# Patient Record
Sex: Female | Born: 1935 | Race: White | Hispanic: No | Marital: Married | Smoking: Never smoker
Health system: Southern US, Community
[De-identification: ages and names within clinical notes are randomized; demographics above are authoritative.]

## PROBLEM LIST (undated history)

## (undated) DIAGNOSIS — I272 Pulmonary hypertension, unspecified: Secondary | ICD-10-CM

## (undated) DIAGNOSIS — M1990 Unspecified osteoarthritis, unspecified site: Secondary | ICD-10-CM

## (undated) DIAGNOSIS — I4891 Unspecified atrial fibrillation: Secondary | ICD-10-CM

## (undated) DIAGNOSIS — Z9289 Personal history of other medical treatment: Secondary | ICD-10-CM

## (undated) DIAGNOSIS — Z8701 Personal history of pneumonia (recurrent): Secondary | ICD-10-CM

## (undated) DIAGNOSIS — Z8719 Personal history of other diseases of the digestive system: Secondary | ICD-10-CM

## (undated) DIAGNOSIS — I509 Heart failure, unspecified: Secondary | ICD-10-CM

## (undated) DIAGNOSIS — I10 Essential (primary) hypertension: Secondary | ICD-10-CM

## (undated) DIAGNOSIS — R0600 Dyspnea, unspecified: Secondary | ICD-10-CM

## (undated) DIAGNOSIS — Z9889 Other specified postprocedural states: Secondary | ICD-10-CM

## (undated) DIAGNOSIS — E871 Hypo-osmolality and hyponatremia: Secondary | ICD-10-CM

## (undated) DIAGNOSIS — I2510 Atherosclerotic heart disease of native coronary artery without angina pectoris: Secondary | ICD-10-CM

## (undated) HISTORY — PX: COLONOSCOPY: SHX174

## (undated) HISTORY — DX: Personal history of other diseases of the digestive system: Z87.19

## (undated) HISTORY — PX: TOTAL HIP ARTHROPLASTY: SHX124

## (undated) HISTORY — DX: Hypo-osmolality and hyponatremia: E87.1

## (undated) HISTORY — PX: TOTAL KNEE ARTHROPLASTY: SHX125

## (undated) HISTORY — DX: Unspecified osteoarthritis, unspecified site: M19.90

## (undated) HISTORY — PX: VIDEO ASSISTED THORACOSCOPY (VATS)/THOROCOTOMY: SHX6173

## (undated) HISTORY — DX: Other specified postprocedural states: Z98.890

## (undated) HISTORY — PX: VESICOVAGINAL FISTULA CLOSURE W/ TAH: SUR271

## (undated) HISTORY — PX: RECTOCELE REPAIR: SHX761

## (undated) HISTORY — DX: Personal history of other medical treatment: Z92.89

---

## 2010-11-18 DIAGNOSIS — H348192 Central retinal vein occlusion, unspecified eye, stable: Secondary | ICD-10-CM | POA: Insufficient documentation

## 2010-11-18 DIAGNOSIS — H3581 Retinal edema: Secondary | ICD-10-CM | POA: Insufficient documentation

## 2010-12-11 DIAGNOSIS — IMO0001 Reserved for inherently not codable concepts without codable children: Secondary | ICD-10-CM | POA: Insufficient documentation

## 2010-12-11 DIAGNOSIS — I10 Essential (primary) hypertension: Secondary | ICD-10-CM | POA: Insufficient documentation

## 2011-02-03 DIAGNOSIS — Z961 Presence of intraocular lens: Secondary | ICD-10-CM | POA: Insufficient documentation

## 2011-06-09 DIAGNOSIS — H35379 Puckering of macula, unspecified eye: Secondary | ICD-10-CM | POA: Insufficient documentation

## 2012-02-07 ENCOUNTER — Ambulatory Visit (INDEPENDENT_AMBULATORY_CARE_PROVIDER_SITE_OTHER): Payer: Medicare Other | Admitting: Cardiovascular Disease

## 2012-02-07 ENCOUNTER — Encounter: Payer: Self-pay | Admitting: Cardiovascular Disease

## 2012-02-07 VITALS — BP 162/80 | HR 72 | Ht 67.0 in | Wt 137.0 lb

## 2012-02-07 DIAGNOSIS — I272 Pulmonary hypertension, unspecified: Secondary | ICD-10-CM

## 2012-02-07 DIAGNOSIS — I351 Nonrheumatic aortic (valve) insufficiency: Secondary | ICD-10-CM | POA: Insufficient documentation

## 2012-02-07 DIAGNOSIS — I2789 Other specified pulmonary heart diseases: Secondary | ICD-10-CM

## 2012-02-07 DIAGNOSIS — I359 Nonrheumatic aortic valve disorder, unspecified: Secondary | ICD-10-CM

## 2012-02-07 DIAGNOSIS — R55 Syncope and collapse: Secondary | ICD-10-CM

## 2012-02-07 NOTE — Patient Instructions (Addendum)
Your physician wants you to follow-up in: 6 MONTHS with Dr Cooper.  You will receive a reminder letter in the mail two months in advance. If you don't receive a letter, please call our office to schedule the follow-up appointment.  Your physician has requested that you have an echocardiogram in 6 MONTHS. Echocardiography is a painless test that uses sound waves to create images of your heart. It provides your doctor with information about the size and shape of your heart and how well your heart's chambers and valves are working. This procedure takes approximately one hour. There are no restrictions for this procedure.  Your physician recommends that you continue on your current medications as directed. Please refer to the Current Medication list given to you today.   

## 2012-02-07 NOTE — Progress Notes (Signed)
HPI:   79 year old woman presenting for initial cardiac evaluation. She was hospitalized 01/17/2012 with syncope. This occurred within several days of a knee replacement surgery. She was noted to have acute blood loss anemia and received blood transfusion during her hospitalization. As part of her syncope evaluation, she underwent a 2-D echocardiogram. This demonstrated normal LV systolic function, mild to moderate aortic insufficiency, mild tricuspid insufficiency, moderate pulmonary hypertension with an RV systolic pressure estimated at 54 mm mercury and no other significant findings.  The patient feels much better since this hospitalization. She received 2 units of packed red blood cells. She's had no further lightheadedness or syncopal episodes. She has no history of cardiac disease and she specifically denies any past problems. She denies chest pain, chest pressure, palpitations, edema, orthopnea, or PND. She's been very active and has had no symptoms with exertion. She's never had syncope until this recent episode.  Outpatient Encounter Prescriptions as of 02/07/2012  Medication Sig Dispense Refill  . amLODipine (NORVASC) 5 MG tablet Take 5 mg by mouth daily. Take 1/2 am and 1/2 in the pm prn      . aspirin 81 MG tablet Take 81 mg by mouth daily.      . calcium-vitamin D (OSCAL) 250-125 MG-UNIT per tablet Take 1 tablet by mouth daily.      . famotidine (PEPCID) 20 MG tablet Take 20 mg by mouth 2 (two) times daily.      . ferrous sulfate 325 (65 FE) MG tablet Take 325 mg by mouth 2 (two) times daily.      Marland Kitchen losartan (COZAAR) 100 MG tablet Take 100 mg by mouth daily.      . potassium chloride SA (K-DUR,KLOR-CON) 20 MEQ tablet Take 20 mEq by mouth 2 (two) times daily.      . traMADol (ULTRAM) 50 MG tablet Take 50 mg by mouth every 6 (six) hours as needed.      . triamterene-hydrochlorothiazide (DYAZIDE) 37.5-25 MG per capsule Take 1 capsule by mouth. Take 1/2 daily        Ace inhibitors;  Acetaminophen; Atenolol; Diltiazem hcl; Hctz; Norvasc; and Oxycodone  Past Medical History  Diagnosis Date  . Osteoarthritis   . Hyponatremia   . History of small bowel obstruction     Managed conservatively    Past Surgical History  Procedure Date  . Vesicovaginal fistula closure w/ tah   . Rectocele repair   . Total knee arthroplasty   . Total hip arthroplasty     History   Social History  . Marital Status: Married    Spouse Name: N/A    Number of Children: N/A  . Years of Education: N/A   Occupational History  . Not on file.   Social History Main Topics  . Smoking status: Never Smoker   . Smokeless tobacco: Not on file  . Alcohol Use: Not on file  . Drug Use: Not on file  . Sexually Active: Not on file   Other Topics Concern  . Not on file   Social History Narrative   The patient is married. She is retired from work. She has no history of tobacco or alcohol use.   Family history: Positive for arthritis, negative for premature coronary artery disease  ROS:  General: no fevers/chills/night sweats Eyes: no blurry vision, diplopia, or amaurosis ENT: no sore throat or hearing loss Resp: no cough, wheezing, or hemoptysis CV: no edema or palpitations GI: no abdominal pain, nausea, vomiting, diarrhea, or constipation GU:  no dysuria, frequency, or hematuria Skin: no rash Neuro: no headache, numbness, tingling, or weakness of extremities Musculoskeletal: Positive for knee and hip pain Heme: no bleeding, DVT, or easy bruising Endo: no polydipsia or polyuria  BP 162/80  Pulse 72  Ht 5\' 7"  (1.702 m)  Wt 62.143 kg (137 lb)  BMI 21.46 kg/m2  PHYSICAL EXAM: Pt is alert and oriented, WD, WN, in no distress. HEENT: normal Neck: JVP normal. Carotid upstrokes normal without bruits. No thyromegaly. Lungs: equal expansion, clear bilaterally CV: Apex is discrete and nondisplaced, RRR with a soft systolic murmur at the left lower sternal border. There are no diastolic  murmurs or gallops present. Abd: soft, NT, +BS, no bruit, no hepatosplenomegaly Back: no CVA tenderness Ext: There is some swelling around the left knee. Otherwise no edema.        Femoral pulses 2+= without bruits        DP/PT pulses intact and = Skin: warm and dry without rash Neuro: CNII-XII intact             Strength intact = bilaterally  EKG:  Normal sinus rhythm 72 beats per minute, left anterior fascicular block, moderate voltage for LVH maybe normal variant.  Echocardiogram: Normal left ventricular size and systolic function without segmental abnormality. Mild mitral regurgitation. Mild to moderate aortic regurgitation. Mild tricuspid regurgitation. Moderate pulmonary hypertension with estimated RVSP 54 mm mercury.  ASSESSMENT AND PLAN: 1. Syncope. Seems pretty clear that this was related to postoperative blood loss. She responded well to conservative treatment with fluids and blood transfusion. Symptoms have completely resolved. She never had syncope in the past before this episode.  2. Mild valvular disease. The patient's echocardiogram demonstrated mild to moderate aortic insufficiency. I do not appreciate a murmur of aortic insufficiency on exam. Continue clinical followup.  3. Pulmonary hypertension. The patient does have elevated pulmonary pressures by echocardiogram estimate. No clear cause of this. Most common causes tend to be left heart disease, sleep apnea, pulmonary thromboembolic disease, connective tissue disease, and lung disease. The patient is healthy and I don't think she needs an extensive workup at this point, especially considering the fact that she has no cardiac symptoms. I have recommended a repeat echocardiogram in 6 months and I will see her back after that study is completed.  I appreciate the opportunity to see this nice lady. I'll see her back in 6 months after her follow-up echo.  Tonny Bollman 02/07/2012 5:31 PM

## 2012-02-22 ENCOUNTER — Encounter: Payer: Self-pay | Admitting: Cardiovascular Disease

## 2012-02-22 NOTE — Telephone Encounter (Signed)
Follow-up:    Patient called in to follow-up on his initial call.  Please call back.

## 2012-02-22 NOTE — Telephone Encounter (Signed)
This encounter was created in error - please disregard.

## 2012-02-22 NOTE — Telephone Encounter (Signed)
New problem   Will discuss with the nurse Lauren when she call.

## 2012-03-22 DIAGNOSIS — F419 Anxiety disorder, unspecified: Secondary | ICD-10-CM | POA: Insufficient documentation

## 2012-03-22 DIAGNOSIS — G4700 Insomnia, unspecified: Secondary | ICD-10-CM | POA: Insufficient documentation

## 2012-08-07 ENCOUNTER — Other Ambulatory Visit (HOSPITAL_COMMUNITY): Payer: Medicare Other

## 2012-08-08 ENCOUNTER — Other Ambulatory Visit: Payer: Self-pay

## 2012-08-08 DIAGNOSIS — I359 Nonrheumatic aortic valve disorder, unspecified: Secondary | ICD-10-CM

## 2012-08-08 DIAGNOSIS — I10 Essential (primary) hypertension: Secondary | ICD-10-CM

## 2012-08-08 DIAGNOSIS — I272 Pulmonary hypertension, unspecified: Secondary | ICD-10-CM

## 2012-08-09 ENCOUNTER — Ambulatory Visit (HOSPITAL_COMMUNITY): Payer: Medicare Other | Attending: Cardiology | Admitting: Radiology

## 2012-08-09 ENCOUNTER — Encounter: Payer: Self-pay | Admitting: Cardiovascular Disease

## 2012-08-09 ENCOUNTER — Other Ambulatory Visit (INDEPENDENT_AMBULATORY_CARE_PROVIDER_SITE_OTHER): Payer: Medicare Other

## 2012-08-09 ENCOUNTER — Ambulatory Visit (INDEPENDENT_AMBULATORY_CARE_PROVIDER_SITE_OTHER): Payer: Medicare Other | Admitting: Cardiovascular Disease

## 2012-08-09 VITALS — BP 132/68 | Ht 67.0 in | Wt 145.0 lb

## 2012-08-09 DIAGNOSIS — I272 Pulmonary hypertension, unspecified: Secondary | ICD-10-CM

## 2012-08-09 DIAGNOSIS — I359 Nonrheumatic aortic valve disorder, unspecified: Secondary | ICD-10-CM

## 2012-08-09 DIAGNOSIS — I080 Rheumatic disorders of both mitral and aortic valves: Secondary | ICD-10-CM | POA: Insufficient documentation

## 2012-08-09 DIAGNOSIS — I10 Essential (primary) hypertension: Secondary | ICD-10-CM

## 2012-08-09 DIAGNOSIS — R55 Syncope and collapse: Secondary | ICD-10-CM | POA: Insufficient documentation

## 2012-08-09 DIAGNOSIS — I351 Nonrheumatic aortic (valve) insufficiency: Secondary | ICD-10-CM

## 2012-08-09 DIAGNOSIS — I2789 Other specified pulmonary heart diseases: Secondary | ICD-10-CM

## 2012-08-09 LAB — BASIC METABOLIC PANEL
BUN: 22 mg/dL (ref 6–23)
Chloride: 101 mEq/L (ref 96–112)
Glucose, Bld: 80 mg/dL (ref 70–99)
Potassium: 4.1 mEq/L (ref 3.5–5.1)

## 2012-08-09 LAB — CBC WITH DIFFERENTIAL/PLATELET
Eosinophils Relative: 0 % (ref 0.0–5.0)
HCT: 37.1 % (ref 36.0–46.0)
Lymphs Abs: 1.4 10*3/uL (ref 0.7–4.0)
MCV: 91.3 fl (ref 78.0–100.0)
Monocytes Absolute: 0.6 10*3/uL (ref 0.1–1.0)
Platelets: 241 10*3/uL (ref 150.0–400.0)
RDW: 13.4 % (ref 11.5–14.6)
WBC: 7 10*3/uL (ref 4.5–10.5)

## 2012-08-09 LAB — HEPATIC FUNCTION PANEL
Alkaline Phosphatase: 114 U/L (ref 39–117)
Bilirubin, Direct: 0 mg/dL (ref 0.0–0.3)
Total Bilirubin: 0.5 mg/dL (ref 0.3–1.2)
Total Protein: 7.3 g/dL (ref 6.0–8.3)

## 2012-08-09 LAB — LIPID PANEL
Cholesterol: 176 mg/dL (ref 0–200)
LDL Cholesterol: 88 mg/dL (ref 0–99)

## 2012-08-09 NOTE — Progress Notes (Signed)
HPI:  79 year old woman presenting for followup evaluation. The patient was initially seen in February of this year. She had syncope after a knee replacement. A 2-D echocardiogram demonstrated findings of pulmonary hypertension with an estimated RV systolic pressure of 54 mm mercury. She was also noted to have mild to moderate aortic insufficiency. At that time her exam was unrevealing. She returned today for followup with an echocardiogram to reassess her valvular disease and pulmonary pressures.  She's doing well the present time. She's had no further episodes of lightheadedness or syncope. She denies chest pain or pressure, dyspnea, edema, or palpitations.  Outpatient Encounter Prescriptions as of 08/09/2012  Medication Sig Dispense Refill  . amLODipine (NORVASC) 5 MG tablet Take 5 mg by mouth daily. Take 1/2 am and 1/2 in the pm prn      . aspirin 81 MG tablet Take 81 mg by mouth daily.      . calcium-vitamin D (OSCAL) 250-125 MG-UNIT per tablet Take 1 tablet by mouth as needed.       Marland Kitchen estrogens, conjugated, (PREMARIN) 0.3 MG tablet Take 0.3 mg by mouth daily. Take daily for 21 days then do not take for 7 days.      Marland Kitchen labetalol (NORMODYNE) 200 MG tablet Take 200 mg by mouth 2 (two) times daily.      Marland Kitchen losartan (COZAAR) 100 MG tablet Take 100 mg by mouth daily.      . Multiple Vitamin (MULTIVITAMIN) capsule Take 1 capsule by mouth daily.      . potassium chloride SA (K-DUR,KLOR-CON) 20 MEQ tablet Take 20 mEq by mouth 2 (two) times daily.      Marland Kitchen triamterene-hydrochlorothiazide (DYAZIDE) 37.5-25 MG per capsule Take 1 capsule by mouth. Take 1/2 daily      . [DISCONTINUED] famotidine (PEPCID) 20 MG tablet Take 20 mg by mouth 2 (two) times daily.      . [DISCONTINUED] ferrous sulfate 325 (65 FE) MG tablet Take 325 mg by mouth 2 (two) times daily.      . [DISCONTINUED] traMADol (ULTRAM) 50 MG tablet Take 50 mg by mouth every 6 (six) hours as needed.       No facility-administered encounter  medications on file as of 08/09/2012.    Allergies  Allergen Reactions  . Ace Inhibitors   . Acetaminophen   . Atenolol   . Diltiazem Hcl   . Hctz (Hydrochlorothiazide)   . Oxycodone     Past Medical History  Diagnosis Date  . Osteoarthritis   . Hyponatremia   . History of small bowel obstruction     Managed conservatively    ROS: Negative except as per HPI  BP 132/68  Ht 5\' 7"  (1.702 m)  Wt 65.772 kg (145 lb)  BMI 22.71 kg/m2  PHYSICAL EXAM: Pt is alert and oriented, NAD HEENT: normal Neck: JVP - normal, carotids 2+= without bruits Lungs: CTA bilaterally CV: RRR without murmur or gallop Abd: soft, NT, Positive BS, no hepatomegaly Ext: no C/C/E, distal pulses intact and equal Skin: warm/dry no rash  EKG:  Normal sinus rhythm 64 beats per minute, borderline criteria for LVH.  ASSESSMENT AND PLAN: 1. Aortic insufficiency. I have personally reviewed the patient's echo images. She has mild aortic insufficiency. At most I would estimate this at 2+. Clinical followup is recommended. I do not think she requires further echo imaging.  2. Pulmonary hypertension. This is not evident on today's echo study. RV function is normal. There is no suggestion of RV pressure  or volume overload. Estimated PA pressures based on tricuspid regurgitation velocity are within normal limits.  For followup, I will see her back as needed.  Tonny Bollman 08/09/2012 3:28 PM

## 2012-08-09 NOTE — Patient Instructions (Signed)
Your physician recommends that you schedule a follow-up appointment as needed.   Your physician recommends that you continue on your current medications as directed. Please refer to the Current Medication list given to you today.  

## 2012-08-09 NOTE — Progress Notes (Signed)
Echocardiogram performed.  

## 2013-03-27 DIAGNOSIS — R600 Localized edema: Secondary | ICD-10-CM | POA: Insufficient documentation

## 2014-07-15 DIAGNOSIS — J90 Pleural effusion, not elsewhere classified: Secondary | ICD-10-CM | POA: Insufficient documentation

## 2014-07-22 DIAGNOSIS — N189 Chronic kidney disease, unspecified: Secondary | ICD-10-CM | POA: Insufficient documentation

## 2014-09-01 DIAGNOSIS — R0602 Shortness of breath: Secondary | ICD-10-CM | POA: Insufficient documentation

## 2014-09-06 ENCOUNTER — Encounter: Payer: Self-pay | Admitting: Cardiovascular Disease

## 2014-09-06 ENCOUNTER — Telehealth: Payer: Self-pay | Admitting: Cardiovascular Disease

## 2014-09-06 ENCOUNTER — Ambulatory Visit: Payer: Medicare Other | Admitting: Cardiovascular Disease

## 2014-09-06 ENCOUNTER — Ambulatory Visit (INDEPENDENT_AMBULATORY_CARE_PROVIDER_SITE_OTHER): Payer: Medicare Other | Admitting: Cardiovascular Disease

## 2014-09-06 VITALS — BP 179/87 | HR 84 | Ht 67.0 in | Wt 142.4 lb

## 2014-09-06 DIAGNOSIS — I5033 Acute on chronic diastolic (congestive) heart failure: Secondary | ICD-10-CM | POA: Diagnosis not present

## 2014-09-06 DIAGNOSIS — I351 Nonrheumatic aortic (valve) insufficiency: Secondary | ICD-10-CM

## 2014-09-06 DIAGNOSIS — R0602 Shortness of breath: Secondary | ICD-10-CM | POA: Diagnosis not present

## 2014-09-06 LAB — BRAIN NATRIURETIC PEPTIDE: Pro B Natriuretic peptide (BNP): 652 pg/mL — ABNORMAL HIGH (ref 0.0–100.0)

## 2014-09-06 MED ORDER — POTASSIUM CHLORIDE ER 10 MEQ PO TBCR: 10.0000 meq | EXTENDED_RELEASE_TABLET | Freq: Every day | ORAL | Status: DC

## 2014-09-06 MED ORDER — FUROSEMIDE 40 MG PO TABS: 40.0000 mg | ORAL_TABLET | Freq: Every day | ORAL | Status: DC

## 2014-09-06 NOTE — Telephone Encounter (Signed)
New message    Pt c/o Shortness Of Breath: STAT if SOB developed within the last 24 hours or pt is noticeably SOB on the phone  1. Are you currently SOB (can you hear that pt is SOB on the phone)? Yes  2. How long have you been experiencing SOB? 6 weeks  3. Are you SOB when sitting or when up moving around? Both  4. Are you currently experiencing any other symptoms? No

## 2014-09-06 NOTE — Progress Notes (Signed)
Cardiology Office Note Date:  09/06/2014   ID:  Rachael Myers, DOB March 20, 1929, MRN [id number]  PCP:  Dan Maker, MD  Cardiologist:  Tonny Bollman, MD    Chief Complaint  Patient presents with  . mild aortic regurgitation     History of Present Illness: Rachael Myers is a 81 y.o. female who presents for an unscheduled visit.  She has been followed for pulmonary hypertension with estimated RV systolic pressure 54 mmHg by echo. She also had been noted to have mild to moderate aortic insufficiency.  Repeat echocardiography at the time of her last visit demonstrated no evidence of pulmonary hypertension and her aortic insufficiency was estimated in the mild range.  She called in today with shortness of breath. She underwent a left VATS on July 20th with decortication and wedge resection for recurrent pleural effusion and entrapment of the left lung. She has continued to have problems with shortness of breath and does not feel like she's getting any better. Has been seen back by thoracic surgery at [facility] and there is no clear explanation for her symptoms.   She denies chest pain or pressure. She does c/o orthopnea and PND at times. No edema or heart palpitations. She has taken lasix as needed but doesn't feel like it's helped her much. Reports that she's been in atrial fibrillation over the past year. Has seen Dr Terri Skains and underwent cardioversion but was back in AF within a few days.   Past Medical History  Diagnosis Date  . Osteoarthritis   . Hyponatremia   . History of small bowel obstruction     Managed conservatively    Past Surgical History  Procedure Laterality Date  . Vesicovaginal fistula closure w/ tah    . Rectocele repair    . Total knee arthroplasty    . Total hip arthroplasty      Current Outpatient Prescriptions  Medication Sig Dispense Refill  . estrogens, conjugated, (PREMARIN) 0.3 MG tablet Take 0.3 mg by mouth daily. Take daily for 21 days then do not take  for 7 days.    . furosemide (LASIX) 40 MG tablet Take 40 mg by mouth daily.    . iron polysaccharides (NIFEREX) 150 MG capsule Take 150 mg by mouth daily.    Marland Kitchen labetalol (NORMODYNE) 100 MG tablet Take 100 mg by mouth at bedtime.    . Multiple Vitamin (MULTIVITAMIN) capsule Take 1 capsule by mouth daily.    . Probiotic Product (PROBIOTIC & ACIDOPHILUS EX ST PO) Take 1 tablet by mouth daily.    . Rivaroxaban (XARELTO) 15 MG TABS tablet Take 15 mg by mouth daily with supper.     No current facility-administered medications for this visit.    Allergies:   Ace inhibitors; Acetaminophen; Atenolol; Diltiazem hcl; Hctz; Hydrochloric acid; Hydrocodone-acetaminophen; and Oxycodone   Social History:  The patient  reports that she has never smoked. She does not have any smokeless tobacco history on file.   Family History:  The patient's  family history includes Cancer in her mother.    ROS:  Please see the history of present illness.  Otherwise, review of systems is positive for cough, abdominal pain, diarrhea, blood in stool, depression, passing out, easy bruising, excessive sweating, balance problems.  All other systems are reviewed and negative.    PHYSICAL EXAM: VS:  BP 179/87 mmHg  Pulse 84  Ht 5\' 7"  (1.702 m)  Wt 142 lb 6.4 oz (64.592 kg)  BMI 22.30 kg/m2 , BMI Body mass  index is 22.3 kg/(m^2). GEN: Well nourished, well developed, in no acute distress HEENT: normal Neck: no JVD, no masses. No carotid bruits Cardiac: irregularly irregular without murmur or gallop           Respiratory:  clear to auscultation bilaterally, normal work of breathing GI: soft, nontender, nondistended, + BS MS: no deformity or atrophy Ext: no pretibial edema, pedal pulses 2+= bilaterally Skin: warm and dry, no rash Neuro:  Strength and sensation are intact Psych: euthymic mood, full affect  EKG:  EKG is ordered today. The ekg ordered today shows atrial fibrillation 84 bpm, LAFB  Recent Labs: No results  found for requested labs within last 365 days.   Lipid Panel     Component Value Date/Time   CHOL 176 08/09/2012 0916   TRIG 102.0 08/09/2012 0916   HDL 68.00 08/09/2012 0916   CHOLHDL 3 08/09/2012 0916   VLDL 20.4 08/09/2012 0916   LDLCALC 88 08/09/2012 0916      Wt Readings from Last 3 Encounters:  09/06/14 142 lb 6.4 oz (64.592 kg)  08/09/12 145 lb (65.772 kg)  02/07/12 137 lb (62.143 kg)     Cardiac Studies Reviewed: CTA Chest 07/29/2014: 1. No filling defects identified to suggest pulmonary embolism. 2. Redemonstrated postsurgical changes following left thoracotomy and wedge resection of the left upper lobe and lingula. 3. Mild interlobular septal thickening consistent with mild interstitial edema. 4. Small right pleural effusion, possibly partially loculated, with a small amount of fluid also seen tracking along the major fissures. 5. Redemonstrated 11 mm nodule in the left breast. Correlate with prior mammograms.  Echo 07/26/2014: PROCEDURE Study Quality: Technically difficult. Difficult apical due to bandage. Pt had  thoracentesis. - SUMMARY The left ventricular size is normal. There is normal left ventricular wall thickness.  Left ventricular systolic function is normal. LV ejection fraction = 60-65%. No segmental wall motion abnormalities seen in the left ventricle  Left ventricular filling pattern is indeterminate. There is mild aortic regurgitation. There is mild mitral regurgitation. There is mild tricuspid regurgitation. The right ventricle is mildly dilated. The right ventricular systolic function is mildly reduced. Moderate pulmonary hypertension. Estimated right ventricular systolic pressure is 67 mmHg. There is no pericardial effusion. There is no comparison study available. - FINDINGS:  LEFT VENTRICLE The left ventricular size is normal. There is normal left ventricular wall  thickness. Left ventricular systolic function is normal. LV ejection  fraction  = 60-65%. Left ventricular filling pattern is indeterminate. No segmental  wall motion abnormalities seen in the left ventricle. -  RIGHT VENTRICLE The right ventricle is mildly dilated. The right ventricular systolic  function is mildly reduced.  LEFT ATRIUM The left atrial size is normal.  RIGHT ATRIUM  Right atrial size is normal. - AORTIC VALVE Aortic valve calcification. There is no aortic stenosis. There is mild aortic  regurgitation. - MITRAL VALVE There is moderate mitral annular calcification. There is mild mitral  regurgitation. The regurgitant jet is posteriorly-directed. - TRICUSPID VALVE Structurally normal tricuspid valve. There is mild tricuspid regurgitation.  Moderate pulmonary hypertension. Estimated right ventricular systolic  pressure is 67 mmHg. - PULMONIC VALVE The pulmonic valve is not well visualized. Trace pulmonic valvular  regurgitation. - ARTERIES The aortic root is normal size. - VENOUS Pulmonary venous flow pattern not well visualized. The IVC is normal in size  with an inspiratory collapse of greater then 50%, suggesting normal right  atrial pressure. - EFFUSION There is no pericardial effusion.  ASSESSMENT AND PLAN:  1.  Acute on chronic diastolic CHF: suspect at least a portion of her dyspnea is related to CHF. A BNP is checked today and it is elevated. While she does not have signs of volume excess on exam, labs and clinical history are consistent with this. Will start her on lasix 40 mg and follow her symptomatic response - may need to use higher dose diuretics. Add K-Dur 10 meq daily. Will arrange follow-up labs and clinical visit.   2. Chronic atrial fibrillation: continue rate-control and anticoagulation. Could consider antiarrhythmic therapy and another attempt at cardioversion, but will check her response to regular use of a diuretic first.   3. Hypertension: notes of Dr Shawnee Knapp reviewed. Pt has been difficult to manage  with supine hypertension and orthostasis requiring midodrine. Overall appears to be stable at present.  As part of today's evaluation, I reviewed extensive records from [facility], echo studies, and lab evaluation.   Current medicines are reviewed with the patient today.  The patient does not have concerns regarding medicines.  Labs/ tests ordered today include:  No orders of the defined types were placed in this encounter.    Disposition:   FU with PA/NP 2-3 weeks  Signed, Tonny Bollman, MD  09/06/2014 1:48 PM    [facility] [address] [place], [place], [place]  [address] Phone: [phone]; Fax: [phone]

## 2014-09-06 NOTE — Patient Instructions (Signed)
Medication Instructions:  Your physician has recommended you make the following change in your medication:  1. START Furosemide  take one tablet by mouth daily 2. START Potassium Chloride take one tablet by mouth daily  Labwork: Your physician recommends that you have lab work today: BNP  Your physician recommends that you return for lab work in: 2 WEEKS (BMP)  Testing/Procedures: No new orders.   Follow-Up: Your physician recommends that you schedule a follow-up appointment in: 2 WEEKS with PA/NP   Any Other Special Instructions Will Be Listed Below (If Applicable).

## 2014-09-06 NOTE — Telephone Encounter (Signed)
Patient's husband calling in to see if patient/wife can be worked in with Dr. Excell Seltzer today. She is having increased SOB. S/P CT surgery 6 weeks ago with subsequent pneumonia, per husband. Dr. Excell Seltzer has agreed to see patient today at 1:15pm. Patient's husband verbalized appreciation and stated they will be here at 1:15pm.

## 2014-09-08 ENCOUNTER — Encounter: Payer: Self-pay | Admitting: Cardiovascular Disease

## 2014-09-19 NOTE — Progress Notes (Signed)
Cardiology Office Note   Date:  09/20/2014   ID:  Rachael Myers, DOB 11-16-1929, MRN [id number]  PCP:  Dan Maker, MD  Cardiologist:  Dr. Tonny Bollman   Electrophysiologist:  n/a  Chief Complaint  Patient presents with  . Congestive Heart Failure     History of Present Illness: Rachael Myers is a 81 y.o. female with a hx of chronic atrial fibrillation, pulmonary hypertension, mild to moderate aortic insufficiency. Recently seen by Dr. Excell Seltzer 09/06/14. She was treated for pneumonia and 01/2014 and ultimately developed parapneumonic effusion. She had undergone 4 large volume thoracentesis procedures. Cytology was negative for malignancy. She ultimately underwent left VATS 07/24/14 with decortication and wedge resection for recurrent pleural effusion and entrapment of the left lung. She continued to have problems with shortness of breath. Thoracic surgery at [facility] had seen her and it was no clear explanation for her symptoms. It was felt that she had some volume overload. She was placed on Lasix.  She returns for FU.    Here for follow-up with her husband. She continues to note shortness of breath. Her dyspnea is somewhat confusing. She is able to exercise on a machine at cardiac rehabilitation at [facility] without significant dyspnea. However, she often gets out of breath just taking a shower. She denies orthopnea, PND or edema. She denies chest pain. She denies syncope. She takes her labetalol as needed to treat her blood pressure in the setting of her orthostatic intolerance/hypotension. It is not clear that her symptoms of shortness of breath definitely got worse when she found out she was back in atrial fibrillation. Her breathing has been a problem over the past year or more. She does see a pulmonologist in [facility]. It is not clear what workup has been done for her shortness of breath there. I have encouraged her to follow-up with the pulmonologist-this is scheduled in the  next 2 weeks.   Studies/Reports Reviewed Today:  Chest CT 08/20/14 (at [facility]) 1. No filling defects identified to suggest pulmonary embolism. 2. Redemonstrated postsurgical changes following left thoracotomy and wedge resection of the left upper lobe and lingula. 3. Mild interlobular septal thickening consistent with mild interstitial edema. 4. Small right pleural effusion, possibly partially loculated, with a small amount of fluid also seen tracking along the major fissures. 5. Redemonstrated 11 mm nodule in the left breast. Correlate with prior mammograms.  Echo 07/26/14 (at [facility]) EF 60-65%, no RWMA, mild AI, mild MR, mild TR, mild reduced RVSF, mod Pulmo HTN, RVSP 67 mmHg  Echo 08/09/12 EF 60-65%, no RWMA, Gr 2 DD, mild AI, MAC, mild MR, mild LAE, normal RVF, PASP 38 mmHg   Past Medical History  Diagnosis Date  . Osteoarthritis   . Hyponatremia   . History of small bowel obstruction     Managed conservatively    Past Surgical History  Procedure Laterality Date  . Vesicovaginal fistula closure w/ tah    . Rectocele repair    . Total knee arthroplasty    . Total hip arthroplasty       Current Outpatient Prescriptions  Medication Sig Dispense Refill  . estrogens, conjugated, (PREMARIN) 0.3 MG tablet Take 0.3 mg by mouth daily. Take daily for 21 days then do not take for 7 days.    . furosemide (LASIX) 40 MG tablet Take 1 tablet (40 mg total) by mouth daily. 30 tablet 6  . iron polysaccharides (NIFEREX) 150 MG capsule Take 150 mg by mouth daily.    Marland Kitchen  labetalol (NORMODYNE) 100 MG tablet Take 100 mg by mouth at bedtime.    . Multiple Vitamin (MULTIVITAMIN) capsule Take 1 capsule by mouth daily.    . potassium chloride (K-DUR) 10 MEQ tablet Take 1 tablet (10 mEq total) by mouth daily. 30 tablet 6  . Probiotic Product (PROBIOTIC & ACIDOPHILUS EX ST PO) Take 1 tablet by mouth daily.    . Rivaroxaban (XARELTO) 15 MG TABS tablet Take 15 mg by mouth daily with supper.      No current facility-administered medications for this visit.    Allergies:   Ace inhibitors; Acetaminophen; Atenolol; Diltiazem hcl; Hctz; Hydrochloric acid; Hydrocodone-acetaminophen; and Oxycodone    Social History:  The patient  reports that she has never smoked. She does not have any smokeless tobacco history on file.   Family History:  The patient's family history includes Cancer in her mother.    ROS:   Please see the history of present illness.   Review of Systems  Cardiovascular: Positive for dyspnea on exertion and irregular heartbeat.  Respiratory: Positive for shortness of breath.   Hematologic/Lymphatic: Bruises/bleeds easily.  Musculoskeletal: Positive for back pain, joint pain and joint swelling.  Neurological: Positive for loss of balance.  All other systems reviewed and are negative.     PHYSICAL EXAM: VS:  BP 180/90 mmHg  Pulse 70  Ht 5' 6.5" (1.689 m)  Wt 140 lb 1.9 oz (63.558 kg)  BMI 22.28 kg/m2  SpO2 98%    Wt Readings from Last 3 Encounters:  09/20/14 140 lb 1.9 oz (63.558 kg)  09/06/14 142 lb 6.4 oz (64.592 kg)  08/09/12 145 lb (65.772 kg)     GEN: Well nourished, well developed, in no acute distress HEENT: normal Neck: no JVD,  no masses Cardiac:  Normal S1/S2, RRR; no murmur ,  no rubs or gallops, no edema   Respiratory:  clear to auscultation bilaterally, no wheezing, rhonchi or rales. GI: soft, nontender, nondistended, + BS MS: no deformity or atrophy Skin: warm and dry  Neuro:  CNs II-XII intact, Strength and sensation are intact Psych: Normal affect   EKG:  EKG is ordered today.  It demonstrates:   AFib, HR 70, no change from prior tracing   Recent Labs: 09/06/2014: Pro B Natriuretic peptide (BNP) 652.0*    Lipid Panel    Component Value Date/Time   CHOL 176 08/09/2012 0916   TRIG 102.0 08/09/2012 0916   HDL 68.00 08/09/2012 0916   CHOLHDL 3 08/09/2012 0916   VLDL 20.4 08/09/2012 0916   LDLCALC 88 08/09/2012 0916       ASSESSMENT AND PLAN:  1. Chronic Diastolic CHF:  Volume appears to be stable on exam. Her BNP previously was 652. She may have had some improvement with her Lasix. I will repeat her BMET and BNP today. If her BNP remains significantly elevated, consider increasing Lasix further.  2. Chronic Atrial Fibrillation:  Rate controlled.  Not clear this is all from AFib. Reviewed with Dr. Tonny Bollman on the phone.  At this point, we will avoid having her see the [facility].  She took a medication with her cardiologist in [place] that caused dizziness.  Will try to get records. For now, continue rate control strategy. Continue Xarelto.  Creatinine Clearance is < 50.    3.  HTN:  She takes Labetalol prn for high BP.  She sees [facility]. Shawnee Knapp in [facility] for her HTN.   4.  Pulmonary HTN:  Mod by echo in 7/16  at [facility].  If dyspnea continues may need RHC.  5.  Aortic Insufficiency:  Mild by echo in 7/16.  6. Shortness of Breath: Etiology not entirely clear. It is not clear that it is all related atrial fibrillation or congestive heart failure. I have encouraged her to follow-up with the pulmonologist as outlined. She does have pulmonary hypertension on echocardiogram. If symptoms continue over time, may need to consider right heart catheterization. I reviewed her case today with Dr. Excell Seltzer on the telephone. I will arrange a Lexiscan Myoview to rule out ischemia as a cause of her shortness of breath. I have encouraged her to continue to exercise at the rehabilitation center at [facility]. Some of her dyspnea may be related to deconditioning.     Medication Changes: Current medicines are reviewed at length with the patient today.  Concerns regarding medicines are as outlined above.  The following changes have been made:   Discontinued Medications   No medications on file   Modified Medications   No medications on file   New Prescriptions   No medications on file    Labs/ tests ordered today include:    Orders Placed This Encounter  Procedures  . B Nat Peptide  . Myocardial Perfusion Imaging  . EKG 12-Lead      Disposition:    FU with Dr. Tonny Bollman or me 6 weeks.    Signed, Brynda Rim, MHS 09/20/2014 11:31 AM    [facility] [address] [place], [place], [place]  [address] Phone: [phone]; Fax: [phone]

## 2014-09-20 ENCOUNTER — Other Ambulatory Visit (INDEPENDENT_AMBULATORY_CARE_PROVIDER_SITE_OTHER): Payer: Medicare Other | Admitting: *Deleted

## 2014-09-20 ENCOUNTER — Telehealth: Payer: Self-pay | Admitting: *Deleted

## 2014-09-20 ENCOUNTER — Ambulatory Visit (INDEPENDENT_AMBULATORY_CARE_PROVIDER_SITE_OTHER): Payer: Medicare Other | Admitting: Physician Assistant

## 2014-09-20 ENCOUNTER — Telehealth: Payer: Self-pay | Admitting: Physician Assistant

## 2014-09-20 ENCOUNTER — Encounter: Payer: Self-pay | Admitting: Physician Assistant

## 2014-09-20 VITALS — BP 180/90 | HR 70 | Ht 66.5 in | Wt 140.1 lb

## 2014-09-20 DIAGNOSIS — I10 Essential (primary) hypertension: Secondary | ICD-10-CM

## 2014-09-20 DIAGNOSIS — I5032 Chronic diastolic (congestive) heart failure: Secondary | ICD-10-CM | POA: Diagnosis not present

## 2014-09-20 DIAGNOSIS — R0602 Shortness of breath: Secondary | ICD-10-CM

## 2014-09-20 DIAGNOSIS — I272 Pulmonary hypertension, unspecified: Secondary | ICD-10-CM

## 2014-09-20 DIAGNOSIS — I270 Primary pulmonary hypertension: Secondary | ICD-10-CM

## 2014-09-20 DIAGNOSIS — I351 Nonrheumatic aortic (valve) insufficiency: Secondary | ICD-10-CM

## 2014-09-20 DIAGNOSIS — I482 Chronic atrial fibrillation, unspecified: Secondary | ICD-10-CM

## 2014-09-20 LAB — BASIC METABOLIC PANEL
BUN: 33 mg/dL — ABNORMAL HIGH (ref 6–23)
CALCIUM: 9.1 mg/dL (ref 8.4–10.5)
CO2: 30 mEq/L (ref 19–32)
Chloride: 101 mEq/L (ref 96–112)
Creatinine, Ser: 1.31 mg/dL — ABNORMAL HIGH (ref 0.40–1.20)
GFR: 41.19 mL/min — AB (ref >60.00)
GLUCOSE: 78 mg/dL (ref 70–99)
POTASSIUM: 4 meq/L (ref 3.5–5.1)
SODIUM: 138 meq/L (ref 135–145)

## 2014-09-20 LAB — BRAIN NATRIURETIC PEPTIDE: PRO B NATRI PEPTIDE: 466 pg/mL — AB (ref 0.0–100.0)

## 2014-09-20 NOTE — Telephone Encounter (Signed)
ROI faxed to [facility] records rec back placed in chart prep bin.

## 2014-09-20 NOTE — Telephone Encounter (Signed)
Pt notified of lab results and recommendation to continue current lasix dose; by phone with  verbal understanding

## 2014-09-20 NOTE — Addendum Note (Signed)
Addended by: Tonita Phoenix on: 09/20/2014 09:22 AM   Modules accepted: Orders

## 2014-09-20 NOTE — Patient Instructions (Signed)
Medication Instructions:  Your physician recommends that you continue on your current medications as directed. Please refer to the Current Medication list given to you today.   Labwork: TODAY BNP  Testing/Procedures: Your physician has requested that you have a lexiscan myoview. For further information please visit [email or web address] Please follow instruction sheet, as given.   Follow-Up: SCOTT WEAVER, PAC 6 WEEKS SAME DAY DR. Excell Seltzer IS IN THE OFFICE IF POSSIBLE  Any Other Special Instructions Will Be Listed Below (If Applicable).

## 2014-09-26 ENCOUNTER — Telehealth (HOSPITAL_COMMUNITY): Payer: Self-pay | Admitting: *Deleted

## 2014-09-26 NOTE — Telephone Encounter (Signed)
Left message on voicemail in reference to upcoming appointment scheduled for 10/01/14. Phone number given for a call back so details instructions can be given. Mary J Kortni Hasten, RN 

## 2014-09-27 ENCOUNTER — Telehealth (HOSPITAL_COMMUNITY): Payer: Self-pay

## 2014-09-27 NOTE — Telephone Encounter (Signed)
Patient given detailed instructions per Myocardial Perfusion Study Information Sheet for test on 10/01/14 at 9:00. Patient notified to arrive 15 minutes early and that it is imperative to arrive on time for appointment to keep from having the test rescheduled.  If you need to cancel or reschedule your appointment, please call the office within 24 hours of your appointment. Failure to do so may result in a cancellation of your appointment, and a $50 no show fee. Patient verbalized understanding. Doyne Keel.

## 2014-10-01 ENCOUNTER — Ambulatory Visit (HOSPITAL_COMMUNITY): Payer: Medicare Other | Attending: Cardiology

## 2014-10-01 DIAGNOSIS — R0609 Other forms of dyspnea: Secondary | ICD-10-CM | POA: Insufficient documentation

## 2014-10-01 DIAGNOSIS — R0602 Shortness of breath: Secondary | ICD-10-CM | POA: Diagnosis present

## 2014-10-01 DIAGNOSIS — I10 Essential (primary) hypertension: Secondary | ICD-10-CM | POA: Diagnosis not present

## 2014-10-01 DIAGNOSIS — R002 Palpitations: Secondary | ICD-10-CM | POA: Insufficient documentation

## 2014-10-01 LAB — MYOCARDIAL PERFUSION IMAGING
CSEPPHR: 93 {beats}/min
LHR: 0.31
LVDIAVOL: 82 mL
LVSYSVOL: 38 mL
Rest HR: 87 {beats}/min
SDS: 1
SRS: 0
SSS: 1
TID: 0.96

## 2014-10-01 MED ORDER — TECHNETIUM TC 99M SESTAMIBI GENERIC - CARDIOLITE
32.3000 | Freq: Once | INTRAVENOUS | Status: AC | PRN
  Administered 2014-10-01: 32.3 via INTRAVENOUS

## 2014-10-01 MED ORDER — REGADENOSON 0.4 MG/5ML IV SOLN
0.4000 mg | Freq: Once | INTRAVENOUS | Status: AC
  Administered 2014-10-01: 0.4 mg via INTRAVENOUS

## 2014-10-01 MED ORDER — TECHNETIUM TC 99M SESTAMIBI GENERIC - CARDIOLITE
10.2000 | Freq: Once | INTRAVENOUS | Status: AC | PRN
  Administered 2014-10-01: 10 via INTRAVENOUS

## 2014-10-02 ENCOUNTER — Encounter: Payer: Self-pay | Admitting: Physician Assistant

## 2014-10-03 ENCOUNTER — Telehealth: Payer: Self-pay | Admitting: Physician Assistant

## 2014-10-03 NOTE — Telephone Encounter (Signed)
New message ° ° ° ° ° °Returning a nurses call to get stress test results °

## 2014-10-03 NOTE — Telephone Encounter (Signed)
Called patient back. Patient called for stress test results. Per Tereso Newcomer PA, stress test normal and continue with current treatment plan. Patient verbalized understanding.

## 2014-10-31 NOTE — Progress Notes (Signed)
Cardiology Office Note   Date:  11/01/2014   ID:  Rachael Myers, DOB Apr 19, 1929, MRN [id number]  PCP:  Dan Maker, MD  Cardiologist:  Dr. Tonny Bollman   Electrophysiologist:  n/a  Chief Complaint  Patient presents with  . Follow-up  . Shortness of Breath  . Congestive Heart Failure  . Atrial Fibrillation     History of Present Illness: Rachael Myers is a 81 y.o. female with a hx of chronic atrial fibrillation, pulmonary hypertension, mild to moderate aortic insufficiency. Recently seen by Dr. Excell Seltzer 09/06/14. She was treated for pneumonia and 01/2014 and ultimately developed parapneumonic effusion. She had undergone 4 large volume thoracentesis procedures. Cytology was negative for malignancy. She ultimately underwent left VATS 07/24/14 with decortication and wedge resection for recurrent pleural effusion and entrapment of the left lung. She continued to have problems with shortness of breath. Thoracic surgery at [facility] had seen her and there was no clear explanation for her symptoms. It was felt that she had some volume overload. She was placed on Lasix.    I saw her last month she continue to complain of shortness of breath. BNP was improved. BUN and creatinine were slightly elevated. Diuretics were continued at the same dose. Nuclear study was performed. This demonstrated no ischemia. She returns for follow-up.  She continues to feel short of breath with some activities. Overall, she feels that her breathing is somewhat improved. She denies orthopnea or PND. Denies edema. Denies chest pain or syncope. She works out at cardiac rehabilitation on the [employer] machine for an hour at a time without difficulty.   Studies/Reports Reviewed Today:  Records from [facility] (Dr. Rudolpho Sevin) Propafenone-discontinued secondary to fears of resultant hypotension Multaq-intolerant, DC'd Sotalol and Flecainide not chosen secondary to CKD  Myoview 9/16 Myocardial perfusion is normal. The  study is normal. This is a low risk study. Overall left ventricular systolic function was abnormal. LV cavity size is normal. Nuclear stress EF: 54%. The left ventricular ejection fraction is mildly decreased (45-54%). There is no prior study for comparison.  Chest CT 08/20/14 (at [facility]) 1. No filling defects identified to suggest pulmonary embolism. 2. Redemonstrated postsurgical changes following left thoracotomy and wedge resection of the left upper lobe and lingula. 3. Mild interlobular septal thickening consistent with mild interstitial edema. 4. Small right pleural effusion, possibly partially loculated, with a small amount of fluid also seen tracking along the major fissures. 5. Redemonstrated 11 mm nodule in the left breast. Correlate with prior mammograms.  Echo 07/26/14 (at [facility]) EF 60-65%, no RWMA, mild AI, mild MR, mild TR, mild reduced RVSF, mod Pulmo HTN, RVSP 67 mmHg  Echo 08/09/12 EF 60-65%, no RWMA, Gr 2 DD, mild AI, MAC, mild MR, mild LAE, normal RVF, PASP 38 mmHg   Past Medical History  Diagnosis Date  . Osteoarthritis   . Hyponatremia   . History of small bowel obstruction     Managed conservatively  . History of nuclear stress test     Myoview 9/16:  EF 54%, normal study    Past Surgical History  Procedure Laterality Date  . Vesicovaginal fistula closure w/ tah    . Rectocele repair    . Total knee arthroplasty    . Total hip arthroplasty       Current Outpatient Prescriptions  Medication Sig Dispense Refill  . estrogens, conjugated, (PREMARIN) 0.3 MG tablet Take 0.3 mg by mouth daily. Take daily for 21 days then do not take for 7  days.    . furosemide (LASIX) 40 MG tablet Take 1 tablet (40 mg total) by mouth daily. 30 tablet 6  . iron polysaccharides (NIFEREX) 150 MG capsule Take 150 mg by mouth daily.    Marland Kitchen. labetalol (NORMODYNE) 100 MG tablet Take 100 mg by mouth at bedtime.    . Multiple Vitamin (MULTIVITAMIN) capsule Take 1 capsule by mouth  daily.    . potassium chloride (K-DUR) 10 MEQ tablet Take 1 tablet (10 mEq total) by mouth daily. 30 tablet 6  . Probiotic Product (PROBIOTIC & ACIDOPHILUS EX ST PO) Take 1 tablet by mouth daily.    . Rivaroxaban (XARELTO) 15 MG TABS tablet Take 15 mg by mouth daily with supper.     No current facility-administered medications for this visit.    Allergies:   Ace inhibitors; Acetaminophen; Atenolol; Diltiazem hcl; Hctz; Hydrochloric acid; Hydrocodone-acetaminophen; and Oxycodone    Social History:  The patient  reports that she has never smoked. She does not have any smokeless tobacco history on file.   Family History:  The patient's family history includes Cancer in her mother.    ROS:   Please see the history of present illness.   Review of Systems  Cardiovascular: Positive for irregular heartbeat.  Respiratory: Positive for shortness of breath.   Hematologic/Lymphatic: Bruises/bleeds easily.  Musculoskeletal: Positive for back pain and joint pain.  All other systems reviewed and are negative.     PHYSICAL EXAM: VS:  BP 160/88 mmHg  Pulse 74  Ht 5' 6.5" (1.689 m)  Wt 141 lb 1.9 oz (64.012 kg)  BMI 22.44 kg/m2    Wt Readings from Last 3 Encounters:  11/01/14 141 lb 1.9 oz (64.012 kg)  10/01/14 140 lb (63.504 kg)  09/20/14 140 lb 1.9 oz (63.558 kg)     GEN: Well nourished, well developed, in no acute distress HEENT: normal Neck: no JVD,  no masses Cardiac:  Normal S1/S2, irreg irreg rhythm; no murmur ,  no rubs or gallops, no edema   Respiratory:  clear to auscultation bilaterally, no wheezing, rhonchi or rales. GI: soft, nontender, nondistended, + BS MS: no deformity or atrophy Skin: warm and dry  Neuro:  CNs II-XII intact, Strength and sensation are intact Psych: Normal affect   EKG:  EKG is not ordered today.  It demonstrates:   n/a   Recent Labs: 09/20/2014: BUN 33*; Creatinine, Ser 1.31*; Potassium 4.0; Pro B Natriuretic peptide (BNP) 466.0*; Sodium 138     Lipid Panel    Component Value Date/Time   CHOL 176 08/09/2012 0916   TRIG 102.0 08/09/2012 0916   HDL 68.00 08/09/2012 0916   CHOLHDL 3 08/09/2012 0916   VLDL 20.4 08/09/2012 0916   LDLCALC 88 08/09/2012 0916      ASSESSMENT AND PLAN:  1. Chronic Diastolic CHF:  Volume appears stable. Continue current dose of Lasix.  2. Chronic Atrial Fibrillation:  Rate controlled.  Continue Xarelto.  Creatinine Clearance is < 50.  I reviewed her records [facility] [place] [facility]. The patient was intolerant of propafenone and Multaq. She was not placed on sotalol or flecainide given chronic kidney disease. She has been in atrial fibrillation for quite some time. I'm not convinced that her symptoms are related atrial fibrillation. Likelihood of maintaining normal sinus rhythm is fairly low. She does feel rapid heart rates at times when she is short of breath. If her symptoms persist, we could consider a 2 week event monitor to document  what her rate is when she is short of breath.  3.  HTN:  She takes Labetalol prn for high BP.  She sees Dr. Shawnee Knapp in [facility] for her HTN.   4.  Pulmonary HTN:  PASP 67 in 7/16 at [facility].  This was at the time that she was having recurrent pleural effusions. I will repeat a limited echo to document her PASP. If it remains significantly elevated, consider referral to Dr. Shirlee Latch.    5.  Aortic Insufficiency:  Mild by echo in 7/16.  6. Shortness of Breath:   Recent Myoview low risk.  Suspect etiology related to deconditioning. Repeat limited echo as noted. Continue cardiac rehab.       Medication Changes: Current medicines are reviewed at length with the patient today.  Concerns regarding medicines are as outlined above.  The following changes have been made:   Discontinued Medications   No medications on file   Modified Medications   No medications on file   New Prescriptions   No medications on file   Labs/ tests ordered today  include:   Orders Placed This Encounter  Procedures  . EKG 12-Lead  . Echocardiogram     Disposition:    FU Dr. Tonny Bollman 3 mos.     Signed, Brynda Rim, MHS 11/01/2014 9:20 AM    [facility] [address] [place], [place], [place]  [address] Phone: [phone]; Fax: [phone]

## 2014-11-01 ENCOUNTER — Ambulatory Visit (INDEPENDENT_AMBULATORY_CARE_PROVIDER_SITE_OTHER): Payer: Medicare Other | Admitting: Physician Assistant

## 2014-11-01 ENCOUNTER — Encounter: Payer: Self-pay | Admitting: Physician Assistant

## 2014-11-01 VITALS — BP 160/88 | HR 74 | Ht 66.5 in | Wt 141.1 lb

## 2014-11-01 DIAGNOSIS — R0602 Shortness of breath: Secondary | ICD-10-CM | POA: Diagnosis not present

## 2014-11-01 DIAGNOSIS — I482 Chronic atrial fibrillation, unspecified: Secondary | ICD-10-CM

## 2014-11-01 DIAGNOSIS — I5032 Chronic diastolic (congestive) heart failure: Secondary | ICD-10-CM

## 2014-11-01 DIAGNOSIS — I272 Other secondary pulmonary hypertension: Secondary | ICD-10-CM

## 2014-11-01 DIAGNOSIS — I351 Nonrheumatic aortic (valve) insufficiency: Secondary | ICD-10-CM

## 2014-11-01 DIAGNOSIS — I10 Essential (primary) hypertension: Secondary | ICD-10-CM | POA: Diagnosis not present

## 2014-11-01 NOTE — Patient Instructions (Signed)
Medication Instructions:  1. Your physician recommends that you continue on your current medications as directed. Please refer to the Current Medication list given to you today.   Labwork: NONE  Testing/Procedures: Your physician has requested that you have an LIMITED echocardiogram DX CHECK EF/RVF, CHECK PASP; DX PULM HTN. Echocardiography is a painless test that uses sound waves to create images of your heart. It provides your doctor with information about the size and shape of your heart and how well your heart's chambers and valves are working. This procedure takes approximately one hour. There are no restrictions for this procedure.   Follow-Up: 3 MONTHS DR. Excell SeltzerOOPER; APPT TO BE LAT MORNING OR MID AFTERNOON  Any Other Special Instructions Will Be Listed Below (If Applicable).     If you need a refill on your cardiac medications before your next appointment, please call your pharmacy.

## 2014-11-13 ENCOUNTER — Encounter: Payer: Self-pay | Admitting: Physician Assistant

## 2014-11-13 ENCOUNTER — Other Ambulatory Visit: Payer: Self-pay | Admitting: Physician Assistant

## 2014-11-13 ENCOUNTER — Ambulatory Visit (HOSPITAL_COMMUNITY): Payer: Medicare Other | Attending: Cardiology

## 2014-11-13 ENCOUNTER — Telehealth: Payer: Self-pay | Admitting: *Deleted

## 2014-11-13 ENCOUNTER — Other Ambulatory Visit: Payer: Self-pay

## 2014-11-13 DIAGNOSIS — I272 Other secondary pulmonary hypertension: Secondary | ICD-10-CM | POA: Diagnosis present

## 2014-11-13 DIAGNOSIS — I313 Pericardial effusion (noninflammatory): Secondary | ICD-10-CM | POA: Diagnosis not present

## 2014-11-13 DIAGNOSIS — I4891 Unspecified atrial fibrillation: Secondary | ICD-10-CM | POA: Diagnosis not present

## 2014-11-13 NOTE — Telephone Encounter (Signed)
Pt notified of limited echo results with findings. Pt is agreeable to plan of care of being referred to Dr. Shirlee LatchMcLean for further eval of Pulmonary HTN. [facility] to schedule an [facility]t.

## 2014-11-18 ENCOUNTER — Telehealth: Payer: Self-pay | Admitting: Cardiovascular Disease

## 2014-11-18 NOTE — Telephone Encounter (Signed)
I spoke with the pt and her husband in regards to Pulmonary HTN and advised that we would like her to see Dr Shirlee LatchMcLean to see if he has any further recommendations. I made her aware that I do have her on Dr Earmon Phoenixooper's wait list for an appointment in January and she should be contacted this week in regards to an appointment with Dr Shirlee LatchMcLean.

## 2014-11-18 NOTE — Telephone Encounter (Signed)
Pt scheduled for Pulmonary HTN clinic 12/02/14.

## 2014-11-18 NOTE — Telephone Encounter (Signed)
New Message  Pt requested to speak w/ RN concerning echo results. Pt husband stated she is to f/u w/ Dr Shirlee LatchMclean- per Tereso NewcomerScott Weaver OV note (10/27)- to f/u w/ Dr Excell Seltzerooper in 3 months. Please call back and discuss.

## 2014-12-02 ENCOUNTER — Ambulatory Visit (HOSPITAL_COMMUNITY)
Admission: RE | Admit: 2014-12-02 | Discharge: 2014-12-02 | Disposition: A | Discharge status: Home / Self Care | Payer: Medicare Other | Source: Ambulatory Visit | Attending: Cardiology | Admitting: Cardiology

## 2014-12-02 ENCOUNTER — Encounter (HOSPITAL_COMMUNITY): Payer: Self-pay | Admitting: *Deleted

## 2014-12-02 VITALS — BP 114/68 | HR 83 | Ht 66.5 in | Wt 141.8 lb

## 2014-12-02 DIAGNOSIS — I272 Other secondary pulmonary hypertension: Secondary | ICD-10-CM | POA: Diagnosis not present

## 2014-12-02 DIAGNOSIS — Z79899 Other long term (current) drug therapy: Secondary | ICD-10-CM | POA: Diagnosis not present

## 2014-12-02 DIAGNOSIS — R0602 Shortness of breath: Secondary | ICD-10-CM

## 2014-12-02 DIAGNOSIS — N189 Chronic kidney disease, unspecified: Secondary | ICD-10-CM | POA: Insufficient documentation

## 2014-12-02 DIAGNOSIS — I482 Chronic atrial fibrillation, unspecified: Secondary | ICD-10-CM

## 2014-12-02 DIAGNOSIS — Z7901 Long term (current) use of anticoagulants: Secondary | ICD-10-CM | POA: Insufficient documentation

## 2014-12-02 DIAGNOSIS — I351 Nonrheumatic aortic (valve) insufficiency: Secondary | ICD-10-CM | POA: Diagnosis not present

## 2014-12-02 DIAGNOSIS — I5032 Chronic diastolic (congestive) heart failure: Secondary | ICD-10-CM | POA: Diagnosis present

## 2014-12-02 DIAGNOSIS — J90 Pleural effusion, not elsewhere classified: Secondary | ICD-10-CM | POA: Insufficient documentation

## 2014-12-02 LAB — BASIC METABOLIC PANEL
Anion gap: 9 (ref 5–15)
BUN: 27 mg/dL — ABNORMAL HIGH (ref 6–20)
CALCIUM: 9.3 mg/dL (ref 8.9–10.3)
CO2: 27 mmol/L (ref 22–32)
CREATININE: 1.42 mg/dL — AB (ref 0.44–1.00)
Chloride: 100 mmol/L — ABNORMAL LOW (ref 101–111)
GFR calc Af Amer: 38 mL/min — ABNORMAL LOW (ref >60)
GFR calc non Af Amer: 33 mL/min — ABNORMAL LOW (ref >60)
GLUCOSE: 97 mg/dL (ref 65–99)
Potassium: 3.9 mmol/L (ref 3.5–5.1)
Sodium: 136 mmol/L (ref 135–145)

## 2014-12-02 LAB — BRAIN NATRIURETIC PEPTIDE: B Natriuretic Peptide: 577.7 pg/mL — ABNORMAL HIGH (ref 0.0–100.0)

## 2014-12-02 MED ORDER — POTASSIUM CHLORIDE ER 10 MEQ PO TBCR: 20.0000 meq | EXTENDED_RELEASE_TABLET | Freq: Every day | ORAL | Status: DC

## 2014-12-02 MED ORDER — METOPROLOL SUCCINATE ER 25 MG PO TB24: 25.0000 mg | ORAL_TABLET | Freq: Every day | ORAL | Status: DC

## 2014-12-02 MED ORDER — FUROSEMIDE 40 MG PO TABS
ORAL_TABLET | ORAL | Status: DC
Start: 2014-12-02 — End: 2015-01-23

## 2014-12-02 NOTE — Patient Instructions (Signed)
Stop Labetalol  Start Metoprolol XL 25 mg daily  Increase Furosemide (Lasix) 40 mg Twice daily 3 DAYS ONLY, then take 40 mg in AM and 20 mg (1/2 tab) in PM  Increase Potassium to 20 meq (2 tabs) daily  Labs today  Labs in 10 days  Your physician has recommended that you have a pulmonary function test. Pulmonary Function Tests are a group of tests that measure how well air moves in and out of your lungs.  Right Heart Catheterization on Tuesday 12/17/14  Your physician recommends that you schedule a follow-up appointment in: 3 weeks

## 2014-12-02 NOTE — Progress Notes (Signed)
Advanced Heart Failure Medication Review by a Pharmacist  Does the patient  feel that his/her medications are working for him/her?  yes  Has the patient been experiencing any side effects to the medications prescribed?  no  Does the patient measure his/her own blood pressure or blood glucose at home?  yes   Does the patient have any problems obtaining medications due to transportation or finances?   no  Understanding of regimen: good Understanding of indications: good Potential of compliance: good Patient understands to avoid NSAIDs. Patient understands to avoid decongestants.  Issues to address at subsequent visits: None   Pharmacist comments:  \Rachael Myers is a pleasant 81 yo F presenting with a current medication list. She reports excellent compliance with her medications and did not have any specific medication-related questions or concerns for me at this time.   Rachael DeisErika K. Bonnye Myers, PharmD, BCPS, CPP Clinical Pharmacist Pager: [phone] Phone: [phone] 12/02/2014 2:29 PM      Time with patient: 8 minutes Preparation and documentation time: 2 minutes Total time: 10 minutes

## 2014-12-02 NOTE — Progress Notes (Signed)
Patient ID: Rachael BergeronBetty Myers, female   DOB: 09-Sep-1929, 81 y.o.   MRN: [id number] PCP: Dr. Royanne Footsichard Orr Cardiology: Dr. Excell Seltzerooper HF Cardiology: Dr. Shirlee LatchMcLean  81 yo with chronic diastolic CHF, chronic atrial fibrillation, and prominent exertional dyspnea presents to CHF clinic for evaluation.  She has had atrial fibrillation for over a year and failed Multaq and propafenone use.  She is in atrial fibrillation chronically now.  In 1/16, she developed PNA.  She had a parapneumonic effusion that required 4 thoracenteses.  Eventually, she had VATS with decortication and a wedge resection.  Cytology was negative from the pleural fluid.  [facility] chest CT [facility] in 8/16 and showed no PE, mild interstitial edema.  Last echo was done in 11/16, showing normal LV EF and apparently normal RV EF.  PA systolic pressure estimation was moderate to severely increased.   Rachael Myers has very prominent exertional dyspnea.  She can walk about 50 feet before stopping.  She is short of breath walking up steps.  Occasional PND episodes.  No chest pain.  No lightheadedness/syncope. +bendopnea . She has never smoked.  She has been on Lasix since 7/16.   ECG (9/16): atrial fibrillation at 70, LAFB, nonspecific T wave flattening  Labs (9/16): K 4, creatinine 1.31, BNP 466 PMH: 1. Cardiolite (9/16) with EF 54%, no ischemia/infarction. 2. Chronic diastolic CHF: Echo (11/16) with EF 55-60%, normal RV size and systolic function, PA systolic pressure 66 mmHg.  3. Chronic atrial fibrillation: She has been intolerant of Multaq and propafenone.  Sotalol and flecainide have not been used due to CKD.   4. SBO: Managed conservatively. 5. CKD 6. OA: h/o THR and TKR.  7. H/o hyponatremia 8. Pneumonia with parapneumonic effusion with thoracenteses and eventual VATS and decortication.   SH: Married, lives in [place], nonsmoker, no ETOH.   FH: Brother with rheumatic fever.   ROS: All systems reviewed and negative except as per  HPI.   Current Outpatient Prescriptions  Medication Sig Dispense Refill  . estrogens, conjugated, (PREMARIN) 0.3 MG tablet Take 0.3 mg by mouth daily. Take daily for 21 days then do not take for 7 days.    . furosemide (LASIX) 40 MG tablet Take 1 tab in AM and 1/2 tab in PM 45 tablet 6  . iron polysaccharides (NIFEREX) 150 MG capsule Take 150 mg by mouth daily.    . Multiple Vitamin (MULTIVITAMIN) capsule Take 1 capsule by mouth daily.    . potassium chloride (K-DUR) 10 MEQ tablet Take 2 tablets (20 mEq total) by mouth daily. 60 tablet 6  . Probiotic Product (PROBIOTIC & ACIDOPHILUS EX ST PO) Take 1 tablet by mouth daily.    . Rivaroxaban (XARELTO) 15 MG TABS tablet Take 15 mg by mouth daily with supper.    . metoprolol succinate (TOPROL-XL) 25 MG 24 hr tablet Take 1 tablet (25 mg total) by mouth daily. 30 tablet 6   No current facility-administered medications for this encounter.   BP 114/68 mmHg  Pulse 83  Ht 5' 6.5" (1.689 m)  Wt 141 lb 12.8 oz (64.32 kg)  BMI 22.55 kg/m2  SpO2 91% General: NAD Neck: JVP 10-12 cm, no thyromegaly or thyroid nodule.  Lungs: Clear to auscultation bilaterally with normal respiratory effort. CV: Nondisplaced PMI.  Heart irregular S1/S2, no S3/S4, no murmur.  No peripheral edema.  No carotid bruit.  Normal pedal pulses.  Abdomen: Soft, nontender, no hepatosplenomegaly, no distention.  Skin: Intact without lesions or rashes.  Neurologic:  Alert and oriented x 3.  Psych: Normal affect. Extremities: No clubbing or cyanosis.  HEENT: Normal.   Assessment/Plan:  1. Chronic diastolic CHF:  NYHA class III symptoms.  She is volume overloaded on exam.  - Increase Lasix to 40 mg bid x 3 days then 40 qam/20 qpm.  Increase KCl to 20 daily.  - BMET/BNP today and repeat BMET/BNP in 10 days.  2. Pulmonary hypertension: Moderate to severe pulmonary hypertension by echo (PASP 66 mmHg).  It is possible that this is pulmonary venous hypertension from volume  overload/diastolic dysfunction.  She has also had some lung damage related to pneumonia with subsequent parapneumonic effusion and VATS.   - I will arrange for RHC to assess for PAH versus PVH. Hold Xarelto 2 days prior.  3. Pulmonary: s/p PNA with parapneumonic effusion and eventual VATS with decortication.  I am going to refer her for PFTs.  I'll see what the PFTs look like, may refer her to a pulmonologist here.   4. Atrial fibrillation: Chronic, apparently > 1 year.   - Will need to consider cardioversion on Tikosyn.  I will see how well she can be compensated with diuresis, and if she continues to have CHF symptoms/signs, would consider attempting DCCV with Tikosyn to maintain NSR.  - Continue Xarelto. - Stop labetalol, only takes once daily.  Start Toprol XL 25 mg daily.   [name] 12/02/2014

## 2014-12-03 ENCOUNTER — Other Ambulatory Visit (HOSPITAL_COMMUNITY): Payer: Self-pay | Admitting: *Deleted

## 2014-12-03 DIAGNOSIS — I272 Pulmonary hypertension, unspecified: Secondary | ICD-10-CM

## 2014-12-10 ENCOUNTER — Ambulatory Visit (HOSPITAL_COMMUNITY)
Admission: RE | Admit: 2014-12-10 | Discharge: 2014-12-10 | Disposition: A | Discharge status: Home / Self Care | Payer: Medicare Other | Source: Ambulatory Visit | Attending: Cardiology | Admitting: Cardiology

## 2014-12-10 DIAGNOSIS — R0602 Shortness of breath: Secondary | ICD-10-CM | POA: Diagnosis not present

## 2014-12-10 DIAGNOSIS — I272 Other secondary pulmonary hypertension: Secondary | ICD-10-CM | POA: Insufficient documentation

## 2014-12-10 LAB — PULMONARY FUNCTION TEST
DL/VA % pred: 82 %
DL/VA: 4.15 ml/min/mmHg/L
DLCO UNC % PRED: 53 %
DLCO UNC: 14.37 ml/min/mmHg
FEF 25-75 PRE: 1.26 L/s
FEF 25-75 Post: 2.75 L/sec
FEF2575-%Change-Post: 117 %
FEF2575-%PRED-PRE: 93 %
FEF2575-%Pred-Post: 203 %
FEV1-%CHANGE-POST: 23 %
FEV1-%PRED-POST: 90 %
FEV1-%Pred-Pre: 73 %
FEV1-POST: 1.82 L
FEV1-Pre: 1.47 L
FEV1FVC-%Change-Post: 13 %
FEV1FVC-%Pred-Pre: 104 %
FEV6-%CHANGE-POST: 9 %
FEV6-%PRED-POST: 82 %
FEV6-%PRED-PRE: 75 %
FEV6-POST: 2.11 L
FEV6-PRE: 1.93 L
FEV6FVC-%PRED-POST: 106 %
FEV6FVC-%Pred-Pre: 106 %
FVC-%CHANGE-POST: 9 %
FVC-%Pred-Post: 77 %
FVC-%Pred-Pre: 71 %
FVC-Post: 2.11 L
FVC-Pre: 1.93 L
POST FEV1/FVC RATIO: 86 %
POST FEV6/FVC RATIO: 100 %
PRE FEV6/FVC RATIO: 100 %
Pre FEV1/FVC ratio: 76 %
RV % PRED: 74 %
RV: 1.9 L
TLC % PRED: 71 %
TLC: 3.8 L

## 2014-12-10 MED ORDER — ALBUTEROL SULFATE (2.5 MG/3ML) 0.083% IN NEBU
2.5000 mg | INHALATION_SOLUTION | Freq: Once | RESPIRATORY_TRACT | Status: AC
  Administered 2014-12-10: 2.5 mg via RESPIRATORY_TRACT

## 2014-12-11 ENCOUNTER — Telehealth (HOSPITAL_COMMUNITY): Payer: Self-pay | Admitting: Cardiology

## 2014-12-11 NOTE — Telephone Encounter (Signed)
Pt scheduled for RHC with Dr.McLean on 12/17/14 Cpt [id number] With patients current insurance [employer]

## 2014-12-17 ENCOUNTER — Encounter (HOSPITAL_COMMUNITY): Payer: Self-pay | Admitting: Cardiology

## 2014-12-17 ENCOUNTER — Encounter (HOSPITAL_COMMUNITY)
Admission: RE | Disposition: A | Discharge status: Home / Self Care | Payer: Self-pay | Source: Ambulatory Visit | Attending: Cardiology

## 2014-12-17 ENCOUNTER — Ambulatory Visit (HOSPITAL_COMMUNITY)
Admission: RE | Admit: 2014-12-17 | Discharge: 2014-12-17 | Disposition: A | Discharge status: Home / Self Care | Payer: Medicare Other | Source: Ambulatory Visit | Attending: Cardiology | Admitting: Cardiology

## 2014-12-17 DIAGNOSIS — I5032 Chronic diastolic (congestive) heart failure: Secondary | ICD-10-CM | POA: Diagnosis not present

## 2014-12-17 DIAGNOSIS — I482 Chronic atrial fibrillation: Secondary | ICD-10-CM | POA: Insufficient documentation

## 2014-12-17 DIAGNOSIS — I272 Other secondary pulmonary hypertension: Secondary | ICD-10-CM | POA: Insufficient documentation

## 2014-12-17 DIAGNOSIS — Z7901 Long term (current) use of anticoagulants: Secondary | ICD-10-CM | POA: Diagnosis not present

## 2014-12-17 DIAGNOSIS — I270 Primary pulmonary hypertension: Secondary | ICD-10-CM | POA: Diagnosis not present

## 2014-12-17 DIAGNOSIS — J90 Pleural effusion, not elsewhere classified: Secondary | ICD-10-CM | POA: Insufficient documentation

## 2014-12-17 DIAGNOSIS — N189 Chronic kidney disease, unspecified: Secondary | ICD-10-CM | POA: Diagnosis not present

## 2014-12-17 HISTORY — PX: CARDIAC CATHETERIZATION: SHX172

## 2014-12-17 LAB — CBC
HCT: 39.2 % (ref 36.0–46.0)
Hemoglobin: 12.4 g/dL (ref 12.0–15.0)
MCH: 28.5 pg (ref 26.0–34.0)
MCHC: 31.6 g/dL (ref 30.0–36.0)
MCV: 90.1 fL (ref 78.0–100.0)
PLATELETS: 208 10*3/uL (ref 150–400)
RBC: 4.35 MIL/uL (ref 3.87–5.11)
RDW: 15 % (ref 11.5–15.5)
WBC: 7.9 10*3/uL (ref 4.0–10.5)

## 2014-12-17 LAB — POCT I-STAT 3, VENOUS BLOOD GAS (G3P V)
ACID-BASE EXCESS: 2 mmol/L (ref 0.0–2.0)
BICARBONATE: 27.4 meq/L — AB (ref 20.0–24.0)
O2 Saturation: 68 %
PH VEN: 7.372 — AB (ref 7.250–7.300)
TCO2: 29 mmol/L (ref 0–100)
pCO2, Ven: 47.2 mmHg (ref 45.0–50.0)
pO2, Ven: 37 mmHg (ref 30.0–45.0)

## 2014-12-17 LAB — BASIC METABOLIC PANEL
ANION GAP: 8 (ref 5–15)
BUN: 27 mg/dL — ABNORMAL HIGH (ref 6–20)
CALCIUM: 9.2 mg/dL (ref 8.9–10.3)
CO2: 27 mmol/L (ref 22–32)
CREATININE: 1.31 mg/dL — AB (ref 0.44–1.00)
Chloride: 104 mmol/L (ref 101–111)
GFR calc Af Amer: 42 mL/min — ABNORMAL LOW (ref >60)
GFR, EST NON AFRICAN AMERICAN: 37 mL/min — AB (ref >60)
GLUCOSE: 91 mg/dL (ref 65–99)
Potassium: 4 mmol/L (ref 3.5–5.1)
Sodium: 139 mmol/L (ref 135–145)

## 2014-12-17 SURGERY — RIGHT HEART CATH

## 2014-12-17 MED ORDER — SODIUM CHLORIDE 0.9 % IV SOLN: 250.0000 mL | INTRAVENOUS | Status: DC | PRN

## 2014-12-17 MED ORDER — SODIUM CHLORIDE 0.9 % IV SOLN: INTRAVENOUS | Status: DC

## 2014-12-17 MED ORDER — SODIUM CHLORIDE 0.9 % IJ SOLN: 3.0000 mL | Freq: Two times a day (BID) | INTRAMUSCULAR | Status: DC

## 2014-12-17 MED ORDER — SODIUM CHLORIDE 0.9 % IJ SOLN: 3.0000 mL | INTRAMUSCULAR | Status: DC | PRN

## 2014-12-17 MED ORDER — MIDAZOLAM HCL 2 MG/2ML IJ SOLN
INTRAMUSCULAR | Status: DC | PRN
  Administered 2014-12-17: 1 mg via INTRAVENOUS

## 2014-12-17 MED ORDER — NITROGLYCERIN 1 MG/10 ML FOR IR/CATH LAB
INTRA_ARTERIAL | Status: DC | PRN
  Administered 2014-12-17: 09:00:00

## 2014-12-17 MED ORDER — ACETAMINOPHEN 325 MG PO TABS: 650.0000 mg | ORAL_TABLET | ORAL | Status: DC | PRN

## 2014-12-17 MED ORDER — MIDAZOLAM HCL 2 MG/2ML IJ SOLN
INTRAMUSCULAR | Status: AC
  Filled 2014-12-17: qty 2

## 2014-12-17 MED ORDER — HEPARIN (PORCINE) IN NACL 2-0.9 UNIT/ML-% IJ SOLN
INTRAMUSCULAR | Status: DC | PRN
  Administered 2014-12-17: 10:00:00

## 2014-12-17 MED ORDER — FENTANYL CITRATE (PF) 100 MCG/2ML IJ SOLN
INTRAMUSCULAR | Status: AC
  Filled 2014-12-17: qty 2

## 2014-12-17 MED ORDER — FENTANYL CITRATE (PF) 100 MCG/2ML IJ SOLN
INTRAMUSCULAR | Status: DC | PRN
  Administered 2014-12-17: 25 ug via INTRAVENOUS

## 2014-12-17 MED ORDER — HEPARIN (PORCINE) IN NACL 2-0.9 UNIT/ML-% IJ SOLN
INTRAMUSCULAR | Status: AC
  Filled 2014-12-17: qty 1000

## 2014-12-17 MED ORDER — LIDOCAINE HCL (PF) 1 % IJ SOLN
INTRAMUSCULAR | Status: AC
  Filled 2014-12-17: qty 30

## 2014-12-17 MED ORDER — ONDANSETRON HCL 4 MG/2ML IJ SOLN: 4.0000 mg | Freq: Four times a day (QID) | INTRAMUSCULAR | Status: DC | PRN

## 2014-12-17 SURGICAL SUPPLY — 9 items
CATH BALLN WEDGE 5F 110CM (CATHETERS) ×3 IMPLANT
GUIDEWIRE .025 260CM (WIRE) ×3 IMPLANT
KIT HEART RIGHT NAMIC (KITS) ×3 IMPLANT
PACK CARDIAC CATHETERIZATION (CUSTOM PROCEDURE TRAY) ×3 IMPLANT
PROTECTION STATION PRESSURIZED (MISCELLANEOUS) ×3
SHEATH FAST CATH BRACH 5F 5CM (SHEATH) ×3 IMPLANT
STATION PROTECTION PRESSURIZED (MISCELLANEOUS) ×1 IMPLANT
TRANSDUCER W/STOPCOCK (MISCELLANEOUS) ×3 IMPLANT
WIRE EMERALD 3MM-J .025X260CM (WIRE) ×3 IMPLANT

## 2014-12-17 NOTE — H&P (View-Only) (Signed)
Patient ID: Rachael BergeronBetty Myers, female   DOB: 09-Sep-1929, 81 y.o.   MRN: [id number] PCP: Dr. Royanne Footsichard Orr Cardiology: Dr. Excell Seltzerooper HF Cardiology: Dr. Shirlee LatchMcLean  81 yo with chronic diastolic CHF, chronic atrial fibrillation, and prominent exertional dyspnea presents to CHF clinic for evaluation.  She has had atrial fibrillation for over a year and failed Multaq and propafenone use.  She is in atrial fibrillation chronically now.  In 1/16, she developed PNA.  She had a parapneumonic effusion that required 4 thoracenteses.  Eventually, she had VATS with decortication and a wedge resection.  Cytology was negative from the pleural fluid.  Last chest CT was at [facility] in 8/16 and showed no PE, mild interstitial edema.  Last echo was done in 11/16, showing normal LV EF and apparently normal RV EF.  PA systolic pressure estimation was moderate to severely increased.   Rachael Myers has very prominent exertional dyspnea.  She can walk about 50 feet before stopping.  She is short of breath walking up steps.  Occasional PND episodes.  No chest pain.  No lightheadedness/syncope. +bendopnea . She has never smoked.  She has been on Lasix since 7/16.   ECG (9/16): atrial fibrillation at 72, LAFB, nonspecific T wave flattening  Labs (9/16): K 4, creatinine 1.31, BNP 466 PMH: 1. Cardiolite (9/16) with EF 54%, no ischemia/infarction. 2. Chronic diastolic CHF: Echo (11/16) with EF 55-60%, normal RV size and systolic function, PA systolic pressure 66 mmHg.  3. Chronic atrial fibrillation: She has been intolerant of Multaq and propafenone.  Sotalol and flecainide have not been used due to CKD.   4. SBO: Managed conservatively. 5. CKD 6. OA: h/o THR and TKR.  7. H/o hyponatremia 8. Pneumonia with parapneumonic effusion with thoracenteses and eventual VATS and decortication.   SH: Married, lives in [place], nonsmoker, no ETOH.   FH: Brother with rheumatic fever.   ROS: All systems reviewed and negative except as per  HPI.   Current Outpatient Prescriptions  Medication Sig Dispense Refill  . estrogens, conjugated, (PREMARIN) 0.3 MG tablet Take 0.3 mg by mouth daily. Take daily for 21 days then do not take for 7 days.    . furosemide (LASIX) 40 MG tablet Take 1 tab in AM and 1/2 tab in PM 45 tablet 6  . iron polysaccharides (NIFEREX) 150 MG capsule Take 150 mg by mouth daily.    . Multiple Vitamin (MULTIVITAMIN) capsule Take 1 capsule by mouth daily.    . potassium chloride (K-DUR) 10 MEQ tablet Take 2 tablets (20 mEq total) by mouth daily. 60 tablet 6  . Probiotic Product (PROBIOTIC & ACIDOPHILUS EX ST PO) Take 1 tablet by mouth daily.    . Rivaroxaban (XARELTO) 15 MG TABS tablet Take 15 mg by mouth daily with supper.    . metoprolol succinate (TOPROL-XL) 25 MG 24 hr tablet Take 1 tablet (25 mg total) by mouth daily. 30 tablet 6   No current facility-administered medications for this encounter.   BP 114/68 mmHg  Pulse 83  Ht 5' 6.5" (1.689 m)  Wt 141 lb 12.8 oz (64.32 kg)  BMI 22.55 kg/m2  SpO2 91% General: NAD Neck: JVP 10-12 cm, no thyromegaly or thyroid nodule.  Lungs: Clear to auscultation bilaterally with normal respiratory effort. CV: Nondisplaced PMI.  Heart irregular S1/S2, no S3/S4, no murmur.  No peripheral edema.  No carotid bruit.  Normal pedal pulses.  Abdomen: Soft, nontender, no hepatosplenomegaly, no distention.  Skin: Intact without lesions or rashes.  Neurologic:  Alert and oriented x 3.  Psych: Normal affect. Extremities: No clubbing or cyanosis.  HEENT: Normal.   Assessment/Plan:  1. Chronic diastolic CHF:  NYHA class III symptoms.  She is volume overloaded on exam.  - Increase Lasix to 40 mg bid x 3 days then 40 qam/20 qpm.  Increase KCl to 20 daily.  - BMET/BNP today and repeat BMET/BNP in 10 days.  2. Pulmonary hypertension: Moderate to severe pulmonary hypertension by echo (PASP 66 mmHg).  It is possible that this is pulmonary venous hypertension from volume  overload/diastolic dysfunction.  She has also had some lung damage related to pneumonia with subsequent parapneumonic effusion and VATS.   - I will arrange for RHC to assess for PAH versus PVH. Hold Xarelto 2 days prior.  3. Pulmonary: s/p PNA with parapneumonic effusion and eventual VATS with decortication.  I am going to refer her for PFTs.  I'll see what the PFTs look like, may refer her to a pulmonologist here.   4. Atrial fibrillation: Chronic, apparently > 1 year.   - Will need to consider cardioversion on Tikosyn.  I will see how well she can be compensated with diuresis, and if she continues to have CHF symptoms/signs, would consider attempting DCCV with Tikosyn to maintain NSR.  - Continue Xarelto. - Stop labetalol, only takes once daily.  Start Toprol XL 25 mg daily.   [name] 12/02/2014   

## 2014-12-17 NOTE — Interval H&P Note (Signed)
History and Physical Interval Note:  12/17/2014 9:17 AM  Alexander BergeronBetty Myers  has presented today for surgery, with the diagnosis of hf  The various methods of treatment have been discussed with the patient and family. After consideration of risks, benefits and other options for treatment, the patient has consented to  Procedure(s): Right/Left Heart Cath and Coronary Angiography (N/A) as a surgical intervention .  The patient's history has been reviewed, patient examined, no change in status, stable for surgery.  I have reviewed the patient's chart and labs.  Questions were answered to the patient's satisfaction.     Rachael Myers

## 2014-12-17 NOTE — Discharge Instructions (Signed)
Angiogram, Care After °Refer to this sheet in the next few weeks. These instructions provide you with information about caring for yourself after your procedure. Your health care provider may also give you more specific instructions. Your treatment has been planned according to current medical practices, but problems sometimes occur. Call your health care provider if you have any problems or questions after your procedure. °WHAT TO EXPECT AFTER THE PROCEDURE °After your procedure, it is typical to have the following: °· Bruising at the catheter insertion site that usually fades within 1-2 weeks. °· Blood collecting in the tissue (hematoma) that may be painful to the touch. It should usually decrease in size and tenderness within 1-2 weeks. °HOME CARE INSTRUCTIONS °· Take medicines only as directed by your health care provider. °· You may shower 24-48 hours after the procedure or as directed by your health care provider. Remove the bandage (dressing) and gently wash the site with plain soap and water. Pat the area dry with a clean towel. Do not rub the site, because this may cause bleeding. °· Do not take baths, swim, or use a hot tub until your health care provider approves. °· Check your insertion site every day for redness, swelling, or drainage. °· Do not apply powder or lotion to the site. °· Do not lift over 10 lb (4.5 kg) for 5 days after your procedure or as directed by your health care provider. °· Ask your health care provider when it is okay to: °¨ Return to work or school. °¨ Resume usual physical activities or sports. °¨ Resume sexual activity. °· Do not drive home if you are discharged the same day as the procedure. Have someone else drive you. °· You may drive 24 hours after the procedure unless otherwise instructed by your health care provider. °· Do not operate machinery or power tools for 24 hours after the procedure or as directed by your health care provider. °· If your procedure was done as an  outpatient procedure, which means that you went home the same day as your procedure, a responsible adult should be with you for the first 24 hours after you arrive home. °· Keep all follow-up visits as directed by your health care provider. This is important. °SEEK MEDICAL CARE IF: °· You have a fever. °· You have chills. °· You have increased bleeding from the catheter insertion site. Hold pressure on the site and call office. °SEEK IMMEDIATE MEDICAL CARE IF: °· You have unusual pain at the catheter insertion site. °· You have redness, warmth, or swelling at the catheter insertion site. °· You have drainage (other than a small amount of blood on the dressing) from the catheter insertion site. °· The catheter insertion site is bleeding, and the bleeding does not stop after 30 minutes of holding steady pressure on the site. °· The area near or just beyond the catheter insertion site becomes pale, cool, tingly, or numb. °  °This information is not intended to replace advice given to you by your health care provider. Make sure you discuss any questions you have with your health care provider. °  °Document Released: 07/09/2004 Document Revised: 01/11/2014 Document Reviewed: 05/24/2012 °Elsevier Interactive Patient Education ©2016 [employer] Inc[employer] ° °

## 2014-12-19 ENCOUNTER — Telehealth (HOSPITAL_COMMUNITY): Payer: Self-pay | Admitting: *Deleted

## 2014-12-19 DIAGNOSIS — I272 Pulmonary hypertension, unspecified: Secondary | ICD-10-CM

## 2014-12-19 DIAGNOSIS — J849 Interstitial pulmonary disease, unspecified: Secondary | ICD-10-CM

## 2014-12-19 DIAGNOSIS — R942 Abnormal results of pulmonary function studies: Secondary | ICD-10-CM

## 2014-12-19 NOTE — Telephone Encounter (Signed)
Pt aware of appt, she will try to come by and sign Opsumit form tomorrow, if not she will sign Mon at her f/u appt

## 2014-12-19 NOTE — Telephone Encounter (Signed)
Per Dr Shirlee LatchMcLean pt needs the following items:  Opsumit 10 mg daily  VQ scan  High resolution CT of chest  Referral to Dr Marchelle Gearingamaswamy in Pulmonary  Completed Opsumit order form, will have Dr Shirlee LatchMcLean and pt sign.  Orders for test and referral placed.  Pt is sch for vq and ct on Wed 12/21 pt needs to arrive at 9:45.  Pt is sch to see pulm 03/05/15 at 2:45.  Have attempted to call pt and Left message to call back

## 2014-12-20 ENCOUNTER — Ambulatory Visit (HOSPITAL_COMMUNITY): Payer: Medicare Other

## 2014-12-23 ENCOUNTER — Telehealth: Payer: Self-pay | Admitting: Internal Medicine

## 2014-12-23 ENCOUNTER — Ambulatory Visit (HOSPITAL_COMMUNITY)
Admission: RE | Admit: 2014-12-23 | Discharge: 2014-12-23 | Disposition: A | Discharge status: Home / Self Care | Payer: Medicare Other | Source: Ambulatory Visit | Attending: Cardiology | Admitting: Cardiology

## 2014-12-23 VITALS — BP 161/69 | HR 70 | Ht 66.0 in | Wt 136.8 lb

## 2014-12-23 DIAGNOSIS — I5032 Chronic diastolic (congestive) heart failure: Secondary | ICD-10-CM | POA: Diagnosis present

## 2014-12-23 DIAGNOSIS — I482 Chronic atrial fibrillation, unspecified: Secondary | ICD-10-CM

## 2014-12-23 DIAGNOSIS — Z79899 Other long term (current) drug therapy: Secondary | ICD-10-CM | POA: Diagnosis not present

## 2014-12-23 DIAGNOSIS — I272 Other secondary pulmonary hypertension: Secondary | ICD-10-CM | POA: Insufficient documentation

## 2014-12-23 DIAGNOSIS — Z7901 Long term (current) use of anticoagulants: Secondary | ICD-10-CM | POA: Insufficient documentation

## 2014-12-23 LAB — CBC
HCT: 40.5 % (ref 36.0–46.0)
HEMOGLOBIN: 13.4 g/dL (ref 12.0–15.0)
MCH: 29.5 pg (ref 26.0–34.0)
MCHC: 33.1 g/dL (ref 30.0–36.0)
MCV: 89 fL (ref 78.0–100.0)
PLATELETS: 240 10*3/uL (ref 150–400)
RBC: 4.55 MIL/uL (ref 3.87–5.11)
RDW: 15 % (ref 11.5–15.5)
WBC: 8.8 10*3/uL (ref 4.0–10.5)

## 2014-12-23 MED ORDER — METOPROLOL SUCCINATE ER 50 MG PO TB24: 50.0000 mg | ORAL_TABLET | Freq: Every day | ORAL | Status: DC

## 2014-12-23 NOTE — Patient Instructions (Signed)
Increase Toprol to 50 mg daily  Labs today  Your physician recommends that you schedule a follow-up appointment in: 3 weeks

## 2014-12-23 NOTE — Progress Notes (Signed)
Patient ID: Rachael Myers, female   DOB: 06-20-1929, 81 y.o.   MRN: [id number] PCP: Dr. Royanne Footsichard Orr Cardiology: Dr. Excell Seltzerooper HF Cardiology: Dr. Shirlee LatchMcLean  81 yo with chronic diastolic CHF, chronic atrial fibrillation, and prominent exertional dyspnea presents to CHF clinic for evaluation.  She has had atrial fibrillation for over a year and failed Multaq and propafenone use.  She is in atrial fibrillation chronically now.  In 1/16, she developed PNA.  She had a parapneumonic effusion that required 4 thoracenteses.  Eventually, she had VATS with decortication and a wedge resection.  Cytology was negative from the pleural fluid.  [facility] CT was at [facility] in 8/16 and showed no PE, mild interstitial edema.  Last echo was done in 11/16, showing normal LV EF and apparently normal RV EF.  PA systolic pressure estimation was moderate to severely increased.  I took her for RHC in 12/16, this showed moderate pulmonary arterial hypertension.  PFTs showed a restrictive pattern concerning for interstitial lung disease .  Rachael Myers has prominent exertional dyspnea.  Breathing is, however, some better after increasing Lasix at last visit.  She can walk about 50 yards before stopping.  She is short of breath walking up steps.  No orthopnea/PND.  No chest pain.  No lightheadedness/syncope. She has never smoked.   SBP running in 130s-140s at home.  Weight is down 5 lbs.   ECG (9/16): atrial fibrillation at 70, LAFB, nonspecific T wave flattening  Labs (9/16): K 4, creatinine 1.31, BNP 466 Labs (11/16): BNP 577 Labs (12/16); K 4, creatinine 1.3  PMH: 1. Cardiolite (9/16) with EF 54%, no ischemia/infarction. 2. Chronic diastolic CHF: Echo (11/16) with EF 55-60%, normal RV size and systolic function, PA systolic pressure 66 mmHg.  3. Chronic atrial fibrillation: She has been intolerant of Multaq and propafenone.  Sotalol and flecainide have not been used due to CKD.   4. SBO: Managed conservatively. 5. CKD 6. OA:  h/o THR and TKR.  7. H/o hyponatremia 8. Pneumonia with parapneumonic effusion with thoracenteses and eventual VATS and decortication.  9. Pulmonary hypertension: RHC (12/16) with mean RA 5, PA 62/22 mean 40, mean PCWP 19, CI 2.46, PVR 5 WU.  PFTs (12/16) wiith FVC 71%, FEV1 73%, ratio 104%, TLC 71%, DLCO 53% => moderate restriction concerning for interstitial lung disease.  - 6 minute walk (12/16) with 244 meters  SH: Married, lives in [place], nonsmoker, no ETOH.   FH: Brother with rheumatic fever.   ROS: All systems reviewed and negative except as per HPI.   Current Outpatient Prescriptions  Medication Sig Dispense Refill  . estrogens, conjugated, (PREMARIN) 0.3 MG tablet Take 0.3 mg by mouth daily. Take daily for 21 days then do not take for 7 days.    . furosemide (LASIX) 40 MG tablet Take 1 tab in AM and 1/2 tab in PM 45 tablet 6  . iron polysaccharides (NIFEREX) 150 MG capsule Take 150 mg by mouth daily.    . metoprolol succinate (TOPROL-XL) 50 MG 24 hr tablet Take 1 tablet (50 mg total) by mouth daily. 30 tablet 6  . Multiple Vitamin (MULTIVITAMIN) capsule Take 1 capsule by mouth daily.    . potassium chloride (K-DUR) 10 MEQ tablet Take 2 tablets (20 mEq total) by mouth daily. 60 tablet 6  . Probiotic Product (PROBIOTIC & ACIDOPHILUS EX ST PO) Take 1 tablet by mouth daily.    . Rivaroxaban (XARELTO) 15 MG TABS tablet Take 15 mg by mouth daily with  supper.     No current facility-administered medications for this encounter.   BP 161/69 mmHg  Pulse 70  Ht  (1.676 m)  Wt 136 lb 12.8 oz (62.052 kg)  BMI 22.09 kg/m2  SpO2 98% General: NAD Neck: JVP 7-8 cm, no thyromegaly or thyroid nodule.  Lungs: Clear to auscultation bilaterally with normal respiratory effort. CV: Nondisplaced PMI.  Heart irregular S1/S2, no S3/S4, no murmur.  No peripheral edema.  No carotid bruit.  Normal pedal pulses.  Abdomen: Soft, nontender, no hepatosplenomegaly, no distention.  Skin: Intact  without lesions or rashes.  Neurologic: Alert and oriented x 3.  Psych: Normal affect. Extremities: No clubbing or cyanosis.  HEENT: Normal.   Assessment/Plan:  1. Chronic diastolic CHF:  NYHA class III symptoms.  Volume status looks better on higher Lasix dose. Weight is down.   - Continue Lasix 40 qam/20 qpm.  2. Pulmonary hypertension: Moderate to severe pulmonary hypertension by echo (PASP 66 mmHg). RHC showed moderate pulmonary arterial hypertension.  PFTs with restrictive changes suggestive of interstitial process.  Possible group 1 pulmonary hypertension.  - I will arrange for high resolution noncontrast CT chest given concern for ILD.  - V/Q scan to rule out chronic PEs. - Serological workup needed: send anti-RNP, anti-SCL70, ANA, ANCA, RF.   - I will arrange to start her on macitentan 10 mg daily.  - Pulmonary referral to assess possible interstitial lung disease.  - Baseline 6 minute walk done today, 244 meters.  3. Pulmonary: s/p PNA with parapneumonic effusion and eventual VATS with decortication.  As above, restrictive PFTs.  Referring her to pulmonary.  4. Atrial fibrillation: Chronic, apparently > 1 year.   - Will need to consider cardioversion on Tikosyn.  I will see how well she can be compensated with diuresis and treatment of pulmonary hypertension, and if she continues to have CHF symptoms/signs, would consider attempting DCCV with Tikosyn to maintain NSR.  - Continue Xarelto. - With elevated BP, increase Toprol XL to 50 mg daily.   [name] 12/23/2014

## 2014-12-23 NOTE — Progress Notes (Signed)
Advanced Heart Failure Medication Review by a Pharmacist  Does the patient  feel that his/her medications are working for him/her?  yes  Has the patient been experiencing any side effects to the medications prescribed?  no  Does the patient measure his/her own blood pressure or blood glucose at home?  yes   Does the patient have any problems obtaining medications due to transportation or finances?   no  Understanding of regimen: good Understanding of indications: good Potential of compliance: good Patient understands to avoid NSAIDs. Patient understands to avoid decongestants.  Issues to address at subsequent visits: None   Pharmacist comments:  Rachael Myers is a pleasant 81 yo F presenting with her husband but without a medication list. She reports excellent compliance with her medications and states that [facility]. She did not have any other medication-related questions or concerns for me at this time.   Rachael Myers, PharmD, BCPS, CPP Clinical Pharmacist Pager: [phone] Phone: [phone] 12/23/2014 10:59 AM      Time with patient: 8 minutes Preparation and documentation time: 2 minutes Total time: 10 minutes

## 2014-12-23 NOTE — Telephone Encounter (Signed)
Spoke with Rachael Myers at Dr. Gayland CurryMcClean's office.   Advised her that the only appt available for Consult is at the end of February.  Patient is already scheduled to see MR on 03/05/15.   Rachael Myers said that Dr. Jearld PiesMcClean feels like patient needs to be seen sooner, she is not well, would like for MR to review her records in Epic and decide if he can get patient in sooner.  MR - please advise.

## 2014-12-23 NOTE — Progress Notes (Signed)
6 min walk test complted.  Pt ambulated 800 ft (244 m)

## 2014-12-24 LAB — RHEUMATOID FACTOR: RHEUMATOID FACTOR: 10.2 [IU]/mL (ref 0.0–13.9)

## 2014-12-24 LAB — ANTI-SCLERODERMA ANTIBODY

## 2014-12-24 NOTE — Telephone Encounter (Signed)
MR please advise if there is somewhere we can work this patient in. Thanks.

## 2014-12-24 NOTE — Telephone Encounter (Signed)
Rachael Myers/ Triage)  . D/w Dr Shirlee LatchMcLean. Concern is ILD and uexplained out of proportion dysonea. Dr Shirlee LatchMcLean is tarting opsumit and diuresis. He has ordered HRCT. Could you plese ensure his order is amended with the following words "High Resolution CT chest without contrast on ILD protocol. Prone and Supine image. Only  Dr Leanna BattlesMelinda Blietz or Dr. Trudie Reedaniel Entrikin or Dr Cleone SlimJason Poff to read  Please work with Robynn PaneElise to see when in Jan 2017 patient can be added. Max 12/day. Prefer add-on later part of day

## 2014-12-24 NOTE — Telephone Encounter (Signed)
Can add on 01/15/2015 afternoon. ATC pt, line busy. WCB.

## 2014-12-24 NOTE — Telephone Encounter (Signed)
Called Stacy at CT dept and she put in amendment to HRCT order. Called Kelly at [facility] and she confirmed that she received the amended order.  Robynn Panelise - please advise about follow up appointment with MR.  Thanks.

## 2014-12-25 ENCOUNTER — Encounter (HOSPITAL_COMMUNITY)
Admission: RE | Admit: 2014-12-25 | Discharge: 2014-12-25 | Disposition: A | Discharge status: Home / Self Care | Payer: Medicare Other | Source: Ambulatory Visit | Attending: Cardiology | Admitting: Cardiology

## 2014-12-25 ENCOUNTER — Ambulatory Visit (HOSPITAL_COMMUNITY)
Admission: RE | Admit: 2014-12-25 | Discharge: 2014-12-25 | Disposition: A | Discharge status: Home / Self Care | Payer: Medicare Other | Source: Ambulatory Visit | Attending: Cardiology | Admitting: Cardiology

## 2014-12-25 DIAGNOSIS — I4891 Unspecified atrial fibrillation: Secondary | ICD-10-CM | POA: Insufficient documentation

## 2014-12-25 DIAGNOSIS — I509 Heart failure, unspecified: Secondary | ICD-10-CM | POA: Diagnosis not present

## 2014-12-25 DIAGNOSIS — I272 Other secondary pulmonary hypertension: Secondary | ICD-10-CM | POA: Insufficient documentation

## 2014-12-25 DIAGNOSIS — I517 Cardiomegaly: Secondary | ICD-10-CM | POA: Diagnosis not present

## 2014-12-25 DIAGNOSIS — I2510 Atherosclerotic heart disease of native coronary artery without angina pectoris: Secondary | ICD-10-CM | POA: Diagnosis not present

## 2014-12-25 DIAGNOSIS — J449 Chronic obstructive pulmonary disease, unspecified: Secondary | ICD-10-CM | POA: Diagnosis not present

## 2014-12-25 DIAGNOSIS — J849 Interstitial pulmonary disease, unspecified: Secondary | ICD-10-CM

## 2014-12-25 DIAGNOSIS — R0602 Shortness of breath: Secondary | ICD-10-CM | POA: Diagnosis not present

## 2014-12-25 DIAGNOSIS — J984 Other disorders of lung: Secondary | ICD-10-CM | POA: Insufficient documentation

## 2014-12-25 DIAGNOSIS — R942 Abnormal results of pulmonary function studies: Secondary | ICD-10-CM

## 2014-12-25 MED ORDER — TECHNETIUM TO 99M ALBUMIN AGGREGATED
4.0000 | Freq: Once | INTRAVENOUS | Status: AC | PRN
  Administered 2014-12-25: 4 via INTRAVENOUS

## 2014-12-25 MED ORDER — TECHNETIUM TC 99M DIETHYLENETRIAME-PENTAACETIC ACID: 32.0000 | Freq: Once | INTRAVENOUS | Status: DC | PRN

## 2014-12-25 NOTE — Telephone Encounter (Signed)
Pt returned call and appointment sched fir 01/15/15@1 :30.Rachael Myers

## 2014-12-25 NOTE — Telephone Encounter (Signed)
LM for pt x 1  Please schedule appt on 01/15/15 with MR in the afternoon. Can be added on anytime in PM.

## 2014-12-25 NOTE — Telephone Encounter (Signed)
Will send to MR as FYI that the patient is scheduled. Nothing further needed.

## 2014-12-31 ENCOUNTER — Telehealth: Payer: Self-pay | Admitting: Cardiology

## 2014-12-31 NOTE — Telephone Encounter (Signed)
Pt rtn call from Friday re test results-please call back with those results-pt husband very insistent a call be made asap

## 2014-12-31 NOTE — Telephone Encounter (Signed)
Spoke w/pt and gave test results

## 2014-12-31 NOTE — Telephone Encounter (Signed)
°  Follow Up  Pts husband is calling following up on pts test results. Please call.

## 2015-01-03 ENCOUNTER — Telehealth: Payer: Self-pay | Admitting: Cardiology

## 2015-01-03 ENCOUNTER — Telehealth (HOSPITAL_COMMUNITY): Payer: Self-pay | Admitting: *Deleted

## 2015-01-03 NOTE — Telephone Encounter (Signed)
This pt is followed in the heart failure clinic by Dr Shirlee LatchMcLean.  I will forward this message to the clinic to follow-up with pt.

## 2015-01-03 NOTE — Telephone Encounter (Signed)
Patient husband called to let us know that Rachael Myers is having some problems since starting the Opsumit a week ago. He says that she is coughing all night and into the day, a sore throat, abdomen is distended and she has had a 5 pound weight gain within the last week.  She is also not sleeping. Some nausea not vomiting.  Told pt that would speak to pharmacist and call them back with more information.

## 2015-01-03 NOTE — Telephone Encounter (Signed)
Spoke with Dr Shirlee LatchMcLean and he said for pt to stop the medication and see if it helps. Pt has a follow up appointment on the 19th she will keep that appointment.  Told pt to call if she needed us before the 19th.  She is aware.

## 2015-01-03 NOTE — Telephone Encounter (Signed)
This message was handled by the Heart failure clinic. See additional documentation in there telephone encounter.

## 2015-01-03 NOTE — Telephone Encounter (Signed)
New Message  Pt husband calling to speak w/ RN- pt c/o of worsening SOB, tightness in abdomin since starting opsumit 1 week ago. Please call back and discuss.

## 2015-01-03 NOTE — Telephone Encounter (Signed)
Spoke with [facility] pharm she said that it could be a side effect from the medication.  Will check with Dr Shirlee LatchMcLean to see what he wants us to do.

## 2015-01-15 ENCOUNTER — Institutional Professional Consult (permissible substitution): Payer: Medicare Other | Admitting: Internal Medicine

## 2015-01-15 ENCOUNTER — Encounter: Payer: Self-pay | Admitting: Internal Medicine

## 2015-01-15 ENCOUNTER — Other Ambulatory Visit: Payer: Medicare Other

## 2015-01-15 ENCOUNTER — Ambulatory Visit (INDEPENDENT_AMBULATORY_CARE_PROVIDER_SITE_OTHER): Payer: Medicare Other | Admitting: Internal Medicine

## 2015-01-15 VITALS — BP 138/80 | HR 66 | Ht 66.0 in | Wt 135.6 lb

## 2015-01-15 DIAGNOSIS — I272 Other secondary pulmonary hypertension: Secondary | ICD-10-CM

## 2015-01-15 DIAGNOSIS — R0689 Other abnormalities of breathing: Secondary | ICD-10-CM | POA: Diagnosis not present

## 2015-01-15 DIAGNOSIS — R0600 Dyspnea, unspecified: Secondary | ICD-10-CM

## 2015-01-15 NOTE — Progress Notes (Signed)
Subjective:    Patient ID: Rachael BergeronBetty Myers, female    DOB: December 11, 1929, 82 y.o.   MRN: [id number] PCP Dan MakerR,RICHARD L, MD  HPI  IOV 01/15/2015  Chief Complaint  Patient presents with  . Pulmonary Consult    Pt referred by Dr. Shirlee LatchMcLean for ILD. Pt c/o DOE, dry cough, and chest discomfort when SOB.    82 year old female who worked in [employer]. She is here with her husband. This for dyspnea evaluation and possible interstitial lung disease. Dyspnea started in early 2016. By spring 2016 she was having recurrent pleural effusions which required repeated thoracentesis. Subsequently after starting Lasix therapy it resolved. Husband attributes much of the improvement due to Lasix therapy. But in summer 2060 and because of these pleural effusion she underwent VATS and also left upper lung wedge biopsy which is detailed below.  [facility] shows that on 07/24/2014 she underwent a left VATS pneumolysis and wedge resection resection July 20th 2016. This was associated by left lung decortication same day  Dr Armandina StammerWudel. According to the chart final pathologic diagnosis was benign lung parenchyma with mild chronic pleuritis and pleural fibrosis. This was of the wedge biopsy. Follow-up chest x-rays 08/16/2014 showed improving opacities in the left lower lobe and lingula. Final thought was these are all findings related to community acquired pneumonia but husband is extremely upset and feels that this was all especially the pneumonia complication of the VATS biopsy.  Subsequently saw her on Lasix and pleural effusions resolved. However her dyspnea persisted. Somewhere along the way she got referred to Dr. Marca Anconaalton McLean was done an excellent extensive workup all detailed below. \ Echocardiogram 11/9 2016 shows normal left ventricle ejection fraction 55-60 percent. She has moderate to severely calcified mitral valve without regurgitation. No aortic stenosis. Right ventricle is normal. With the right  atrium was dilated. Pulmonary artery peak pressure is elevated significantly at 66 mmHg. A trivial pericardial effusion is present. The patient was also in a. Fibrillation.  Pulmonary function test 12/10/2014 shows restriction with low isolated diffusion capacity raising the possibility of interstitial lung disease. Specifically FVC 1.9 L or 71%, ratio 104. Total lung capacity down 3.8 L/71%. DLCO down 14.4/53%.  Lab work hemoglobin normal 12/23/2014 .  Right heart catheterization 121316 PA pressure 62/22 with a mean of 40 and a mildly elevated pulmonary capillary wedge pressure of 19. Cardiac index of 2.46. RA mean pressure of 7. Dr. Shirlee LatchMcLean has continued Lasix. He ordered a VQ scan which turned her to be normal. He got limited serologies for autoimmune disease that are normal. He is working on getting patient started on Macitentan   VQ scan 12/25/2014 shows low probably due to pulmonary embolism  High-resolution CT chest 12/25/2014 and read by thoracic radiology shows most like attenuation throughout the lungs likely due to pulmonary vascular disease. No evidence of interstitial lung disease. There is evidence of mild subpleural scarring associated with a left upper lobe wedge resection. No residual or recurrent pleural effusion. I personally visualized the film.  Autoimmune panel 12/23/2014 shows normal rheumatoid factor and scleroderma panel.  She has now been referred to pulmonary to make sure noterstitial lung disease. She reports persistent dyspnea that is significant. She works out 3 times a week. This [facility]. She was started on  Opsumit 12/25/2014 but she says she developed all the side effects and the literature including sore mouth. Therefore she stopped it. She is going to see Dr. Marca Anconaalton McLean 01/23/2015  and the plan is for Adcirca as discussed with him today on the phone.  Walking dessaturate 185 feet x 3  Laps on RA : Pk HR wsa 72. It did not  go up any further. She is on Toprol 50 mg daily for atrial fibrillation. Did not desat. Stopped at laps due to DYSPNEA    has a past medical history of Osteoarthritis; Hyponatremia; History of small bowel obstruction; History of nuclear stress test; History of echocardiogram; and History of lung biopsy.   reports that she has never smoked. She has never used smokeless tobacco.  Past Surgical History  Procedure Laterality Date  . Vesicovaginal fistula closure w/ tah    . Rectocele repair    . Total knee arthroplasty    . Total hip arthroplasty    . Cardiac catheterization N/A 12/17/2014    Procedure: Right Heart Cath;  Surgeon: Laurey Morale, MD;  Location: [facility] INVASIVE CV LAB;  Service: Cardiovascular;  Laterality: N/A;    Allergies  Allergen Reactions  . Acetaminophen Hives, Swelling and Rash    Lip swelling  . Ace Inhibitors     unknown  . Atenolol     unknown  . Diltiazem Hcl     unknown  . Hctz [Hydrochlorothiazide]     unknown  . Hydrochloric Acid Other (See Comments)    hyponatremia  . Hydrocodone-Acetaminophen Other (See Comments)    Hives  . Opsumit [Macitentan]     Mouth sores  . Oxycodone     unknown    Immunization History  Administered Date(s) Administered  . Influenza,inj,Quad PF,36+ Mos 10/05/2014  . Pneumococcal Conjugate-13 05/05/2014    Family History  Problem Relation Age of Onset  . Cancer Mother      Current outpatient prescriptions:  .  estrogens, conjugated, (PREMARIN) 0.3 MG tablet, Take 0.3 mg by mouth daily. Take daily for 21 days then do not take for 7 days., Disp: , Rfl:  .  furosemide (LASIX) 40 MG tablet, Take 1 tab in AM and 1/2 tab in PM, Disp: 45 tablet, Rfl: 6 .  iron polysaccharides (NIFEREX) 150 MG capsule, Take 150 mg by mouth daily., Disp: , Rfl:  .  metoprolol succinate (TOPROL-XL) 50 MG 24 hr tablet, Take 1 tablet (50 mg total) by mouth daily., Disp: 30 tablet, Rfl: 6 .  potassium chloride (K-DUR) 10 MEQ tablet, Take 2  tablets (20 mEq total) by mouth daily., Disp: 60 tablet, Rfl: 6 .  Probiotic Product (PROBIOTIC & ACIDOPHILUS EX ST PO), Take 1 tablet by mouth daily., Disp: , Rfl:  .  Rivaroxaban (XARELTO) 15 MG TABS tablet, Take 15 mg by mouth daily with supper., Disp: , Rfl:      Review of Systems  Constitutional: Negative for fever and unexpected weight change.  HENT: Negative for congestion, dental problem, ear pain, nosebleeds, postnasal drip, rhinorrhea, sinus pressure, sneezing, sore throat and trouble swallowing.   Eyes: Negative for redness and itching.  Respiratory: Positive for cough and shortness of breath. Negative for chest tightness and wheezing.   Cardiovascular: Negative for palpitations and leg swelling.  Gastrointestinal: Negative for nausea and vomiting.  Genitourinary: Negative for dysuria.  Musculoskeletal: Negative for joint swelling.  Skin: Negative for rash.  Neurological: Negative for headaches.  Hematological: Does not bruise/bleed easily.  Psychiatric/Behavioral: Negative for dysphoric mood. The patient is not nervous/anxious.        Objective:   Physical Exam  Constitutional: She is oriented to person, place, and time. She appears  well-developed and well-nourished. No distress.  HENT:  Head: Normocephalic and atraumatic.  Right Ear: External ear normal.  Left Ear: External ear normal.  Mouth/Throat: Oropharynx is clear and moist. No oropharyngeal exudate.  Eyes: Conjunctivae and EOM are normal. Pupils are equal, round, and reactive to light. Right eye exhibits no discharge. Left eye exhibits no discharge. No scleral icterus.  Neck: Normal range of motion. Neck supple. No JVD present. No tracheal deviation present. No thyromegaly present.  Cardiovascular: Normal rate, regular rhythm, normal heart sounds and intact distal pulses.  Exam reveals no gallop and no friction rub.   No murmur heard. Loud P2  Pulmonary/Chest: Effort normal and breath sounds normal. No  respiratory distress. She has no wheezes. She has no rales. She exhibits no tenderness.  Abdominal: Soft. Bowel sounds are normal. She exhibits no distension and no mass. There is no tenderness. There is no rebound and no guarding.  Musculoskeletal: Normal range of motion. She exhibits no edema or tenderness.  Severe  OA of fingers  Lymphadenopathy:    She has no cervical adenopathy.  Neurological: She is alert and oriented to person, place, and time. She has normal reflexes. No cranial nerve deficit. She exhibits normal muscle tone. Coordination normal.  Skin: Skin is warm and dry. No rash noted. She is not diaphoretic. No erythema. No pallor.  Psychiatric: She has a normal mood and affect. Her behavior is normal. Judgment and thought content normal.  Vitals reviewed.   Filed Vitals:   01/15/15 1610  BP: 138/80  Pulse: 66  Height: 5\' 6"  (1.676 m)  Weight: 135 lb 9.6 oz (61.508 kg)  SpO2: 97%   Estimated body mass index is 21.9 kg/(m^2) as calculated from the following:   Height as of this encounter: 5\' 6"  (1.676 m).   Weight as of this encounter: 135 lb 9.6 oz (61.508 kg).       Assessment & Plan:     ICD-9-CM ICD-10-CM   1. Dyspnea and respiratory abnormality 786.09 R06.00     R06.89   2. Pulmonary hypertension (HCC) 416.8 I27.2      Currently top and main leading causes shortness of breath is a pulmonary hypertension In addition it is possible that her Toprol might be a little bit too strong There is no evidence of interstitial lung disease or pulmonary fibrosis on a CT chest December 2016  Plan - I have discussed this with Dr. [name] a cardiologist  - Do additional autoimmune lab work including ANA, double-stranded DNA, SSA, SSB, cyclic central neck peptide - we will call you with the lab results  - When you see Dr. Shirlee Latch he will discuss primary hypertension therapy and possible lowering of the dose of your Toprol  -Continue exercise therapy at [facility]  Follow-up - Return to see me in 6 months or sooner if needed just to monitor your progress    Dr. Kalman Shan, M.D., [facility].C.P Pulmonary and Critical Care Medicine Staff Physician [facility] Pulmonary and Critical Care Pager: [phone], If no answer or between  15:00h - 7:00h: call [phone]  01/15/2015 5:06 PM

## 2015-01-15 NOTE — Patient Instructions (Addendum)
ICD-9-CM ICD-10-CM   1. Dyspnea and respiratory abnormality 786.09 R06.00     R06.89   2. Pulmonary hypertension (HCC) 416.8 I27.2    Currently top and main leading causes shortness of breath is a pulmonary hypertension In addition it is possible that her Toprol might be a little bit too strong There is no evidence of interstitial lung disease or pulmonary fibrosis on a CT chest December 2016  Plan - I have discussed this with Dr. Marca Anconaalton McLean a cardiologist  - Do additional autoimmune lab work including ANA, double-stranded DNA, SSA, SSB, cyclic citrullinic neck peptide - we will call you with the lab results  - When you see Dr. Shirlee LatchMcLean he will discuss primary hypertension therapy and possible lowering of the dose of your Toprol  -Continue [facility] therapy at [facility]  Follow-up - Return to see me in 6 months or sooner if needed just to monitor your progress

## 2015-01-16 LAB — ANTI-DNA ANTIBODY, DOUBLE-STRANDED

## 2015-01-16 LAB — CYCLIC CITRUL PEPTIDE ANTIBODY, IGG: Cyclic Citrullin Peptide Ab: <16 Units

## 2015-01-16 LAB — ANTI-NUCLEAR AB-TITER (ANA TITER): ANA Titer 1: 1:80 {titer} — ABNORMAL HIGH

## 2015-01-16 LAB — SJOGRENS SYNDROME-B EXTRACTABLE NUCLEAR ANTIBODY: SSB (La) (ENA) Antibody, IgG: <1

## 2015-01-16 LAB — SJOGRENS SYNDROME-A EXTRACTABLE NUCLEAR ANTIBODY: SSA (Ro) (ENA) Antibody, IgG: <1

## 2015-01-16 LAB — ANA: Anti Nuclear Antibody(ANA): POSITIVE — AB

## 2015-01-17 ENCOUNTER — Telehealth: Payer: Self-pay | Admitting: Internal Medicine

## 2015-01-17 NOTE — Telephone Encounter (Signed)
Dalton  Rest of autoimmune workup is negative. ANA 1:80 is probably a normal finding in her age group. No need to reply back. Please update her of results when you see her soon  Dr. Kalman ShanMurali Ciela Mahajan, M.D., [facility].C.P Pulmonary and Critical Care Medicine Staff Physician [facility] Pulmonary and Critical Care Pager: [phone], If no answer or between  15:00h - 7:00h: call [phone]  01/17/2015 1:59 PM

## 2015-01-23 ENCOUNTER — Encounter (HOSPITAL_COMMUNITY): Payer: Self-pay

## 2015-01-23 ENCOUNTER — Ambulatory Visit (HOSPITAL_COMMUNITY)
Admission: RE | Admit: 2015-01-23 | Discharge: 2015-01-23 | Disposition: A | Discharge status: Home / Self Care | Payer: Medicare Other | Source: Ambulatory Visit | Attending: Cardiology | Admitting: Cardiology

## 2015-01-23 VITALS — BP 152/80 | HR 73 | Wt 137.2 lb

## 2015-01-23 DIAGNOSIS — I351 Nonrheumatic aortic (valve) insufficiency: Secondary | ICD-10-CM | POA: Diagnosis not present

## 2015-01-23 DIAGNOSIS — Z79899 Other long term (current) drug therapy: Secondary | ICD-10-CM | POA: Insufficient documentation

## 2015-01-23 DIAGNOSIS — Z96649 Presence of unspecified artificial hip joint: Secondary | ICD-10-CM | POA: Insufficient documentation

## 2015-01-23 DIAGNOSIS — I272 Other secondary pulmonary hypertension: Secondary | ICD-10-CM | POA: Insufficient documentation

## 2015-01-23 DIAGNOSIS — I5032 Chronic diastolic (congestive) heart failure: Secondary | ICD-10-CM | POA: Insufficient documentation

## 2015-01-23 DIAGNOSIS — Z7902 Long term (current) use of antithrombotics/antiplatelets: Secondary | ICD-10-CM | POA: Insufficient documentation

## 2015-01-23 DIAGNOSIS — Z96659 Presence of unspecified artificial knee joint: Secondary | ICD-10-CM | POA: Diagnosis not present

## 2015-01-23 DIAGNOSIS — Z8701 Personal history of pneumonia (recurrent): Secondary | ICD-10-CM | POA: Diagnosis not present

## 2015-01-23 DIAGNOSIS — I482 Chronic atrial fibrillation, unspecified: Secondary | ICD-10-CM

## 2015-01-23 DIAGNOSIS — N189 Chronic kidney disease, unspecified: Secondary | ICD-10-CM | POA: Diagnosis not present

## 2015-01-23 DIAGNOSIS — R0602 Shortness of breath: Secondary | ICD-10-CM

## 2015-01-23 LAB — BASIC METABOLIC PANEL
Anion gap: 12 (ref 5–15)
BUN: 21 mg/dL — AB (ref 6–20)
CALCIUM: 9.7 mg/dL (ref 8.9–10.3)
CO2: 27 mmol/L (ref 22–32)
Chloride: 101 mmol/L (ref 101–111)
Creatinine, Ser: 1.2 mg/dL — ABNORMAL HIGH (ref 0.44–1.00)
GFR calc Af Amer: 47 mL/min — ABNORMAL LOW (ref >60)
GFR, EST NON AFRICAN AMERICAN: 41 mL/min — AB (ref >60)
GLUCOSE: 89 mg/dL (ref 65–99)
Potassium: 4.3 mmol/L (ref 3.5–5.1)
Sodium: 140 mmol/L (ref 135–145)

## 2015-01-23 LAB — BRAIN NATRIURETIC PEPTIDE: B Natriuretic Peptide: 650.6 pg/mL — ABNORMAL HIGH (ref 0.0–100.0)

## 2015-01-23 MED ORDER — POTASSIUM CHLORIDE ER 10 MEQ PO TBCR: 20.0000 meq | EXTENDED_RELEASE_TABLET | Freq: Every day | ORAL | Status: DC

## 2015-01-23 MED ORDER — METOPROLOL SUCCINATE ER 50 MG PO TB24: 50.0000 mg | ORAL_TABLET | Freq: Every day | ORAL | Status: DC

## 2015-01-23 MED ORDER — RIVAROXABAN 15 MG PO TABS: 15.0000 mg | ORAL_TABLET | Freq: Every day | ORAL | Status: DC

## 2015-01-23 MED ORDER — FUROSEMIDE 40 MG PO TABS: 40.0000 mg | ORAL_TABLET | Freq: Two times a day (BID) | ORAL | Status: DC

## 2015-01-23 NOTE — Patient Instructions (Signed)
Increase Lasix to  twice a day.  Routine lab work today. (bmet bnp) Will notify you of abnormal results  Follow up : 1 month with Dr.McLean  Labs: repeat bmet  2 weeks

## 2015-01-24 NOTE — Progress Notes (Signed)
Patient ID: Rachael Myers, female   DOB: 08-10-1929, 82 y.o.   MRN: [id number] PCP: Dr. Royanne Foots Cardiology: Dr. Excell Seltzer HF Cardiology: Dr. Shirlee Latch  82 yo with chronic diastolic CHF, chronic atrial fibrillation, and prominent exertional dyspnea presents to CHF clinic for evaluation.  She has had atrial fibrillation for over a year and failed Multaq and propafenone use.  She is in atrial fibrillation chronically now.  In 1/16, she developed PNA.  She had a parapneumonic effusion that required 4 thoracenteses.  Eventually, she had VATS with decortication and a wedge resection.  Cytology was negative from the pleural fluid.  Last chest CT was at [facility] [place] in 8/16 and showed no PE, mild interstitial edema.  Last echo was done in 11/16, showing normal LV EF and apparently normal RV EF.  PA systolic pressure estimation was moderate to severely increased.  I took her for RHC in 12/16, this showed moderate pulmonary arterial hypertension.  PFTs showed a restrictive pattern concerning for interstitial lung disease.  V/Q scan in 12/16 was not suggestive of chronic PE.  Despite abnormal PFTs, high resolution CT did not show interstitial lung disease.    Rachael Myers has prominent exertional dyspnea.  She can walk about 50 yards before stopping.  She is short of breath walking up steps.  No orthopnea/PND.  No chest pain.  No lightheadedness/syncope. She has never smoked.  SBP running in 130s-140s at home.  At last appointment, I started her on Opsumit.  She developed a cough and mouth soreness with this medication and had to stop it after about 7 days.  Symptoms resolved when she stopped it.   Labs (9/16): K 4, creatinine 1.31, BNP 466 Labs (11/16): BNP 577 Labs (12/16); K 4, creatinine 1.3, normal rheumatoid factor and scleroderma serologies Labs (1/17): ANA positive but only 1:80 titer, dsDNA negative, SSA/B negative, CCP negative.   PMH: 1. Cardiolite (9/16) with EF 54%, no ischemia/infarction. 2. Chronic  diastolic CHF: Echo (11/16) with EF 55-60%, normal RV size and systolic function, PA systolic pressure 66 mmHg.  3. Chronic atrial fibrillation: She has been intolerant of Multaq and propafenone.  Sotalol and flecainide have not been used due to CKD.   4. SBO: Managed conservatively. 5. CKD 6. OA: h/o THR and TKR.  7. H/o hyponatremia 8. Pneumonia with parapneumonic effusion with thoracenteses and eventual VATS and decortication.  9. Pulmonary hypertension: Suspect Group 1 pulmonary hypertension.  RHC (12/16) with mean RA 5, PA 62/22 mean 40, mean PCWP 19, CI 2.46, PVR 5 WU.  PFTs (12/16) wiith FVC 71%, FEV1 73%, ratio 104%, TLC 71%, DLCO 53% => moderate restriction concerning for interstitial lung disease.  High resolution CT (12/16) did not show evidence for interstitial lung disease.  V/Q scan (12/16) did not show evidence for chronic PE.  Serologies: RF negative, scleroderma antibodies negative, ANA positive but only 1:80 titer, SSA/B negative, dsDNA negative, CCP negative.  - 6 minute walk (12/16) with 244 meters  SH: Married, lives in [place], nonsmoker, no ETOH.   FH: Brother with rheumatic fever.   ROS: All systems reviewed and negative except as per HPI.   Current Outpatient Prescriptions  Medication Sig Dispense Refill  . estrogens, conjugated, (PREMARIN) 0.3 MG tablet Take 0.3 mg by mouth daily. Take daily for 21 days then do not take for 7 days.    . furosemide (LASIX) 40 MG tablet Take 1 tablet (40 mg total) by mouth 2 (two) times daily. 180 tablet 3  . metoprolol  succinate (TOPROL-XL) 50 MG 24 hr tablet Take 1 tablet (50 mg total) by mouth daily. 90 tablet 3  . potassium chloride (K-DUR) 10 MEQ tablet Take 2 tablets (20 mEq total) by mouth daily. 180 tablet 6  . Probiotic Product (PROBIOTIC & ACIDOPHILUS EX ST PO) Take 1 tablet by mouth daily.    . Rivaroxaban (XARELTO) 15 MG TABS tablet Take 1 tablet (15 mg total) by mouth daily with supper. 90 tablet 3   No current  facility-administered medications for this encounter.   BP 152/80 mmHg  Pulse 73  Wt 137 lb 4 oz (62.256 kg)  SpO2 98% General: NAD Neck: JVP 8 cm, no thyromegaly or thyroid nodule.  Lungs: Clear to auscultation bilaterally with normal respiratory effort. CV: Nondisplaced PMI.  Heart irregular S1/S2, no S3/S4, no murmur.  Trace edema.  No carotid bruit.  Normal pedal pulses.  Abdomen: Soft, nontender, no hepatosplenomegaly, no distention.  Skin: Intact without lesions or rashes.  Neurologic: Alert and oriented x 3.  Psych: Normal affect. Extremities: No clubbing or cyanosis.  HEENT: Normal.   Assessment/Plan:  1. Chronic diastolic CHF:  NYHA class III symptoms.  Suspect mild residual volume overload. - Increase Lasix to 40 mg bid. BMET/BNP today and again in 2 wks.  2. Pulmonary hypertension: Moderate to severe pulmonary hypertension by echo (PASP 66 mmHg). RHC showed moderate pulmonary arterial hypertension.  PFTs with restrictive changes suggestive of interstitial process but high resolution CT did not show interstitial lung disease.  No evidence for chronic PE on V/Q scan.  Rheumatologic serologies all negative.  Possible group 1 pulmonary hypertension.  She was unable to tolerate macitentan.  - I will start her on Adcirca at 20 mg daily.  3. Pulmonary: s/p PNA with parapneumonic effusion and eventual VATS with decortication.  As above, restrictive PFTs but no ILD on high resolution CT.  4. Atrial fibrillation: Chronic, apparently > 1 year.   - Will need to consider cardioversion on Tikosyn.  I will see how well she can be compensated with diuresis and treatment of pulmonary hypertension, and if she continues to have CHF symptoms/signs, would consider attempting DCCV with Tikosyn to maintain NSR.  - Continue Xarelto and Toprol XL.   [name] 01/24/2015

## 2015-01-31 ENCOUNTER — Ambulatory Visit (INDEPENDENT_AMBULATORY_CARE_PROVIDER_SITE_OTHER): Payer: Medicare Other | Admitting: Neurology

## 2015-01-31 ENCOUNTER — Encounter: Payer: Self-pay | Admitting: Neurology

## 2015-01-31 VITALS — BP 136/90 | HR 62 | Resp 16 | Ht 66.0 in | Wt 136.4 lb

## 2015-01-31 DIAGNOSIS — M542 Cervicalgia: Secondary | ICD-10-CM | POA: Insufficient documentation

## 2015-01-31 DIAGNOSIS — G4489 Other headache syndrome: Secondary | ICD-10-CM | POA: Diagnosis not present

## 2015-01-31 DIAGNOSIS — I272 Other secondary pulmonary hypertension: Secondary | ICD-10-CM | POA: Diagnosis not present

## 2015-01-31 DIAGNOSIS — R55 Syncope and collapse: Secondary | ICD-10-CM | POA: Diagnosis not present

## 2015-01-31 DIAGNOSIS — I5032 Chronic diastolic (congestive) heart failure: Secondary | ICD-10-CM | POA: Diagnosis not present

## 2015-01-31 MED ORDER — NORTRIPTYLINE HCL 10 MG PO CAPS: ORAL_CAPSULE | ORAL | Status: DC

## 2015-01-31 MED ORDER — TAPENTADOL HCL ER 50 MG PO TB12: 50.0000 mg | ORAL_TABLET | Freq: Two times a day (BID) | ORAL | Status: DC | PRN

## 2015-01-31 NOTE — Progress Notes (Addendum)
[facility]  PATIENT: Rachael Myers DOB: Sep 25, 1929  REFERRING DOCTOR OR PCP:  Royanne Foots SOURCE: patient  _________________________________   HISTORICAL  CHIEF COMPLAINT:  Chief Complaint  Patient presents with  . Headache    Sts. she fell out of bed one yr. ago, hitting left temporal region on nightstand--causing a laceration that required 17 stitches.  No loc at that time.  Sts. about a month later she got out of bed to go to the bathroom and possibly had a syncopal episode, falling and hitting head again--left temporal region, again causing laceration requiring stitches. Sts. she has frequent h/a's since then, often waking up in the morning with a h/a.  Also c/o intermittent diziness.  She doesn't take any meds for h/a's.  . Dizziness    Allergic to Tylenol/fim    HISTORY OF PRESENT ILLNESS:  I had the pleasure of seeing your patient, Rachael Myers, at [facility] for neurologic consultation regarding her headaches falls.  October 2015, she fell out of bed hitting the nightstand and hit her head, requiring stitches in the left temple.     She did not lose consciousness but had a lot of bleeding.    She has had some pain in the left forehead since.    She fell again a month later while walking to the bathroom and fell.   She may have lost consciousness before the fall x a couple seconds.      She has not had more falls after that.   She has had shortness of breath that affects her walking.    She does not stumble but needs frequent breaks due to SOB.     Headache is frequent occurring 3-4 days in a row a few times a month and she has a couple other single days of headache/month.   The skin on the left temporal forehead feels tight.   She is allergic to Tylenol.   She is on Xarelto.    Tramadol was tried but it did not help much.    Codeine and oxycodone caused nausea.    She had a CT scan after her falls.     She had a pneumonia January 2016  and received 7 days oral antibiotics as an outpatient but did not improve so she had several days of IV antibiotics.    Around that time, she also was diagnosed with  pulmonary edema and needed fluid removed multiple times and had a lung operation.   However, shortness of breath persists.    A recent cath showed she had pulmonary hypertension.   She also was placed on Xarelto for her CHF.  She denies neck pain.      She has urinary frequency but is on Lasix for hr CHF.    She denies leg weakness but she has knee pain bilaterally and hip pain.    Both knees and right hip have been replaced.     REVIEW OF SYSTEMS: Constitutional: No fevers, chills, sweats, or change in appetite Eyes: No visual changes, double vision, eye pain Ear, nose and throat: No hearing loss, ear pain, nasal congestion, sore throat Cardiovascular: No chest pain, palpitations Respiratory: She reports shortness of breath. GastrointestinaI: No nausea, vomiting, diarrhea, abdominal pain, fecal incontinence Genitourinary: No dysuria, urinary retention.   She has frequency.  Musculoskeletal: No neck pain, back pain Integumentary: No rash, pruritus, skin lesions Neurological: as above Psychiatric: No depression at this time.  No anxiety Endocrine: No palpitations, diaphoresis,  change in appetite, change in weigh or increased thirst Hematologic/Lymphatic: No anemia, purpura, petechiae. Allergic/Immunologic: No itchy/runny eyes, nasal congestion, recent allergic reactions, rashes  ALLERGIES: Allergies  Allergen Reactions  . Acetaminophen Hives, Swelling and Rash    Lip swelling  . Ace Inhibitors     unknown  . Atenolol     unknown  . Diltiazem Hcl     unknown  . Hctz [Hydrochlorothiazide]     unknown  . Hydrochloric Acid Other (See Comments)    hyponatremia  . Hydrocodone-Acetaminophen Other (See Comments)    Hives  . Opsumit [Macitentan]     Mouth sores  . Oxycodone     unknown    HOME  MEDICATIONS:  Current outpatient prescriptions:  .  estrogens, conjugated, (PREMARIN) 0.3 MG tablet, Take 0.3 mg by mouth daily. Take daily for 21 days then do not take for 7 days., Disp: , Rfl:  .  furosemide (LASIX) 40 MG tablet, Take 1 tablet (40 mg total) by mouth 2 (two) times daily., Disp: 180 tablet, Rfl: 3 .  metoprolol succinate (TOPROL-XL) 50 MG 24 hr tablet, Take 1 tablet (50 mg total) by mouth daily., Disp: 90 tablet, Rfl: 3 .  potassium chloride (K-DUR) 10 MEQ tablet, Take 2 tablets (20 mEq total) by mouth daily., Disp: 180 tablet, Rfl: 6 .  Probiotic Product (PROBIOTIC & ACIDOPHILUS EX ST PO), Take 1 tablet by mouth daily., Disp: , Rfl:  .  Rivaroxaban (XARELTO) 15 MG TABS tablet, Take 1 tablet (15 mg total) by mouth daily with supper., Disp: 90 tablet, Rfl: 3  PAST MEDICAL HISTORY: Past Medical History  Diagnosis Date  . Osteoarthritis   . Hyponatremia   . History of small bowel obstruction     Managed conservatively  . History of nuclear stress test     Myoview 9/16:  EF 54%, normal study  . History of echocardiogram     Echo 11/16:  EF 55-60%, no RWMA, MAC, mild LAE, normal RVF, mild RAE, PASP 66 mmHg, trivial eff  . History of lung biopsy     [facility] 01/2014    PAST SURGICAL HISTORY: Past Surgical History  Procedure Laterality Date  . Vesicovaginal fistula closure w/ tah    . Rectocele repair    . Total knee arthroplasty    . Total hip arthroplasty    . Cardiac catheterization N/A 12/17/2014    Procedure: Right Heart Cath;  Surgeon: Laurey Morale, MD;  Location: [facility] INVASIVE CV LAB;  Service: Cardiovascular;  Laterality: N/A;    FAMILY HISTORY: Family History  Problem Relation Age of Onset  . Cancer Mother     SOCIAL HISTORY:  Social History   Social History  . Marital Status: Married    Spouse Name: N/A  . Number of Children: N/A  . Years of Education: N/A   Occupational History  . retired    Social History Main Topics  . Smoking status: Never  Smoker   . Smokeless tobacco: Never Used  . Alcohol Use: No  . Drug Use: No  . Sexual Activity: Not on file   Other Topics Concern  . Not on file   Social History Narrative   The patient is married. She is retired from work. She has no history of tobacco or alcohol use.     PHYSICAL EXAM  Filed Vitals:   01/31/15 1013  BP: 136/90  Pulse: 62  Resp: 16  Height:  (1.676 m)  Weight: 136 lb 6.4 oz (  61.871 kg)    Body mass index is 22.03 kg/(m^2).   General: The patient is well-developed and well-nourished and in no acute distress  Eyes:  Funduscopic exam shows normal optic discs and retinal vessels.  Neck: The neck is supple, no carotid bruits are noted.  The neck is tender over the left upper paraspinal muscles and the left splenius capitis muscle.  Cardiovascular: The heart has a regular rate and rhythm with a normal S1 and S2. There were no murmurs, gallops or rubs.    Skin: Extremities are without significant edema.  Musculoskeletal:  Back is nontender  Neurologic Exam  Mental status: The patient is alert and oriented x 3 at the time of the examination. The patient has apparent normal recent and remote memory, with an apparently normal attention span and concentration ability.   Speech is normal.  Cranial nerves: Extraocular movements are full. Pupils are equal, round, and reactive to light and accomodation.  Visual fields are full.  Facial symmetry is present. There is good facial sensation to soft touch bilaterally.Facial strength is normal.  Trapezius and sternocleidomastoid strength is normal. No dysarthria is noted.  The tongue is midline, and the patient has symmetric elevation of the soft palate. No obvious hearing deficits are noted.  Motor:  Muscle bulk is normal.   Tone is normal. Strength is  5 / 5 in all 4 extremities.   Sensory: Sensory testing is intact to pinprick, soft touch and vibration sensation in all 4 extremities.  Coordination: Cerebellar  testing reveals good finger-nose-finger and heel-to-shin bilaterally.  Gait and station: Station is normal.   Gait is normal. Tandem gait is normal. Romberg is negative.   Reflexes: Deep tendon reflexes are symmetric and normal bilaterally.   Plantar responses are flexor.    DIAGNOSTIC DATA (LABS, IMAGING, TESTING) - I reviewed patient records, labs, notes, testing and imaging myself where available.  Lab Results  Component Value Date   WBC 8.8 12/23/2014   HGB 13.4 12/23/2014   HCT 40.5 12/23/2014   MCV 89.0 12/23/2014   PLT 240 12/23/2014      Component Value Date/Time   NA 140 01/23/2015 1217   K 4.3 01/23/2015 1217   CL 101 01/23/2015 1217   CO2 27 01/23/2015 1217   GLUCOSE 89 01/23/2015 1217   BUN 21* 01/23/2015 1217   CREATININE 1.20* 01/23/2015 1217   CALCIUM 9.7 01/23/2015 1217   PROT 7.3 08/09/2012 0916   ALBUMIN 3.6 08/09/2012 0916   AST 17 08/09/2012 0916   ALT 11 08/09/2012 0916   ALKPHOS 114 08/09/2012 0916   BILITOT 0.5 08/09/2012 0916   GFRNONAA 41* 01/23/2015 1217   GFRAA 47* 01/23/2015 1217   Lab Results  Component Value Date   CHOL 176 08/09/2012   HDL 68.00 08/09/2012   LDLCALC 88 08/09/2012   TRIG 102.0 08/09/2012   CHOLHDL 3 08/09/2012       ASSESSMENT AND PLAN  Syncope, unspecified syncope type  Other headache syndrome  Neck pain  Pulmonary hypertension (HCC)  Chronic diastolic CHF (congestive heart failure) (HCC)   In summary, Rachael Myers is an 82 year old woman with headache since a head injury in October 2015 also had an episode of syncope about a month later. Currently, she has left-sided headache and has some tenderness over the splenius capitis and upper paraspinal muscles on the left.    Trigger point injections of the splenius capitis, C2 and C3 paraspinal muscles on the left was performed with 80 mg Depo-Medrol  in [place].   She tolerated the procedure well and there were no complications.   To help with pain,  nortriptyline 10 mg will be added and she can titrate up to 20 mg at bedtime. If not better within a few weeks, we can try Nucynta as it will hopefully be better tolerated than opiates.  She will return to see me in about 3 months or sooner if there are new or worsening neurologic symptoms or worsening pain.   Rachael Myers A. Epimenio Foot, MD, PhD 01/31/2015, 10:25 AM Certified in Neurology, Clinical Neurophysiology, Sleep Medicine, Pain Medicine and Neuroimaging  [facility] [address] [place], [place] [address] [phone]   ADDENDUM: Note faxed to Dr. Royanne Foots A. Epimenio Foot, MD, PhD

## 2015-02-03 ENCOUNTER — Telehealth (HOSPITAL_COMMUNITY): Payer: Self-pay

## 2015-02-03 NOTE — Telephone Encounter (Signed)
Patient had received a message on the phone from [facility] about medication Adcirca, was not sure what the meaning of the message was.  Did not know if there was a problem getting the medication. I see in the OV that it was going to be started, but I don't see it on the patients medication list or med orders.    Did you want to prescribe Adcirca 20 mg daily?

## 2015-02-03 NOTE — Telephone Encounter (Signed)
Needs Adcirca 20 mg daily. [name] can you check on this?

## 2015-02-03 NOTE — Progress Notes (Signed)
NOTES FROM THIS VISIT FAXED TO DR. Gerlene Burdock ORR FAX # [phone]/fim

## 2015-02-07 ENCOUNTER — Ambulatory Visit (HOSPITAL_COMMUNITY)
Admission: RE | Admit: 2015-02-07 | Discharge: 2015-02-07 | Disposition: A | Discharge status: Home / Self Care | Payer: Medicare Other | Source: Ambulatory Visit | Attending: Cardiology | Admitting: Cardiology

## 2015-02-07 DIAGNOSIS — I5032 Chronic diastolic (congestive) heart failure: Secondary | ICD-10-CM | POA: Insufficient documentation

## 2015-02-07 LAB — BRAIN NATRIURETIC PEPTIDE: B NATRIURETIC PEPTIDE 5: 486.7 pg/mL — AB (ref 0.0–100.0)

## 2015-02-07 LAB — BASIC METABOLIC PANEL
ANION GAP: 9 (ref 5–15)
BUN: 30 mg/dL — ABNORMAL HIGH (ref 6–20)
CO2: 29 mmol/L (ref 22–32)
Calcium: 9.4 mg/dL (ref 8.9–10.3)
Chloride: 99 mmol/L — ABNORMAL LOW (ref 101–111)
Creatinine, Ser: 1.48 mg/dL — ABNORMAL HIGH (ref 0.44–1.00)
GFR, EST AFRICAN AMERICAN: 37 mL/min — AB (ref >60)
GFR, EST NON AFRICAN AMERICAN: 32 mL/min — AB (ref >60)
Glucose, Bld: 107 mg/dL — ABNORMAL HIGH (ref 65–99)
POTASSIUM: 4.1 mmol/L (ref 3.5–5.1)
SODIUM: 137 mmol/L (ref 135–145)

## 2015-02-11 ENCOUNTER — Telehealth (HOSPITAL_COMMUNITY): Payer: Self-pay | Admitting: *Deleted

## 2015-02-11 MED ORDER — SILDENAFIL CITRATE 20 MG PO TABS: 20.0000 mg | ORAL_TABLET | Freq: Three times a day (TID) | ORAL | Status: DC

## 2015-02-11 NOTE — Telephone Encounter (Signed)
Completed PA for pt's Adcirca, med was denied.  Pt must try and fail Silenafil first.  Per Dr Shirlee Latch ok to order Sildenafil 20 mg TID.  Pt's husband aware, rx for Sildenafil sent to [facility].

## 2015-02-12 ENCOUNTER — Other Ambulatory Visit (HOSPITAL_COMMUNITY): Payer: Self-pay | Admitting: *Deleted

## 2015-02-12 ENCOUNTER — Institutional Professional Consult (permissible substitution): Payer: Medicare Other | Admitting: Internal Medicine

## 2015-02-12 MED ORDER — SILDENAFIL CITRATE 20 MG PO TABS: 20.0000 mg | ORAL_TABLET | Freq: Three times a day (TID) | ORAL | Status: DC

## 2015-02-25 ENCOUNTER — Telehealth (HOSPITAL_COMMUNITY): Payer: Self-pay | Admitting: *Deleted

## 2015-02-26 ENCOUNTER — Ambulatory Visit (HOSPITAL_COMMUNITY)
Admission: RE | Admit: 2015-02-26 | Discharge: 2015-02-26 | Disposition: A | Discharge status: Home / Self Care | Payer: Medicare Other | Source: Ambulatory Visit | Attending: Cardiology | Admitting: Cardiology

## 2015-02-26 ENCOUNTER — Encounter (HOSPITAL_COMMUNITY): Payer: Self-pay

## 2015-02-26 VITALS — BP 178/64 | HR 78 | Wt 136.0 lb

## 2015-02-26 DIAGNOSIS — Z7902 Long term (current) use of antithrombotics/antiplatelets: Secondary | ICD-10-CM | POA: Diagnosis not present

## 2015-02-26 DIAGNOSIS — N189 Chronic kidney disease, unspecified: Secondary | ICD-10-CM | POA: Insufficient documentation

## 2015-02-26 DIAGNOSIS — I482 Chronic atrial fibrillation, unspecified: Secondary | ICD-10-CM

## 2015-02-26 DIAGNOSIS — Z96659 Presence of unspecified artificial knee joint: Secondary | ICD-10-CM | POA: Insufficient documentation

## 2015-02-26 DIAGNOSIS — I5032 Chronic diastolic (congestive) heart failure: Secondary | ICD-10-CM | POA: Insufficient documentation

## 2015-02-26 DIAGNOSIS — I272 Other secondary pulmonary hypertension: Secondary | ICD-10-CM | POA: Insufficient documentation

## 2015-02-26 DIAGNOSIS — Z79899 Other long term (current) drug therapy: Secondary | ICD-10-CM | POA: Insufficient documentation

## 2015-02-26 DIAGNOSIS — I130 Hypertensive heart and chronic kidney disease with heart failure and stage 1 through stage 4 chronic kidney disease, or unspecified chronic kidney disease: Secondary | ICD-10-CM | POA: Insufficient documentation

## 2015-02-26 DIAGNOSIS — Z96649 Presence of unspecified artificial hip joint: Secondary | ICD-10-CM | POA: Diagnosis not present

## 2015-02-26 MED ORDER — AMBRISENTAN 5 MG PO TABS: 5.0000 mg | ORAL_TABLET | Freq: Every day | ORAL | Status: DC

## 2015-02-26 NOTE — Progress Notes (Signed)
6 Minute Walk Patient tolerated very well wilth brisk pace, steady gait, no rest breaks needed.  Pulse Ox on RA ranged 98 (starting) to 93 (ending). HR ranged from 67 (starting) to 81 (ending).  Ambulated total of 760 ft. (231.648 meters).  Dr. Shirlee Latch made aware of results.  Ave Filter

## 2015-02-26 NOTE — Patient Instructions (Signed)
Please bring blood pressure readings with you to your next visit.  START Letairis  tablet once daily.  Return in 1 week for lab work.  Follow up 4 weeks with Dr. Shirlee Latch.

## 2015-02-26 NOTE — Progress Notes (Signed)
Patient ID: Rachael Myers, female   DOB: 08/06/1929, 82 y.o.   MRN: [id number] PCP: Dr. Royanne Foots Cardiology: Dr. Excell Seltzer HF Cardiology: Dr. Shirlee Latch  82 yo with chronic diastolic CHF, chronic atrial fibrillation, and prominent exertional dyspnea presents to CHF clinic for evaluation.  She has had atrial fibrillation for over a year and failed Multaq and propafenone use.  She is in atrial fibrillation chronically now.  In 1/16, she developed PNA.  She had a parapneumonic effusion that required 4 thoracenteses.  Eventually, she had VATS with decortication and a wedge resection.  Cytology was negative from the pleural fluid.  Last chest CT [facility] in 8/16 and showed no PE, mild interstitial edema.  Last echo was done in 11/16, showing normal LV EF and apparently normal RV EF.  PA systolic pressure estimation was moderate to severely increased.  I took her for RHC in 12/16, this showed moderate pulmonary arterial hypertension.  PFTs showed a restrictive pattern concerning for interstitial lung disease.  V/Q scan in 12/16 was not suggestive of chronic PE.  Despite abnormal PFTs, high resolution CT did not show interstitial lung disease.    Rachael Myers has prominent exertional dyspnea.  Dyspnea varies => she can walk anywhere from 25 ft to 500 ft before getting short of breath.  She is short of breath walking up steps.  No orthopnea/PND.  No chest pain.  At times, she gets lightheaded walking. She has not passed out. She has never smoked.  SBP varies considerably, running 120s-160s at home.  At a prior appointment, I started her on Opsumit.  She developed a cough and mouth soreness with this medication and had to stop it after about 7 days.  Symptoms resolved when she stopped it.  I tried to get Adcirca for her.  This was denied by her insurance company, so she was started on Revatio 20 mg tid instead.  She thinks that this has helped her breathing "a little."  6 minute walk today was basically unchanged  at 232 meters. She saw pulmonary specialist who thought dyspnea was likely due to [facility].   Labs (9/16): K 4, creatinine 1.31, BNP 466 Labs (11/16): BNP 577 Labs (12/16); K 4, creatinine 1.3, normal rheumatoid factor and scleroderma serologies Labs (1/17): ANA positive but only 1:80 titer, dsDNA negative, SSA/B negative, CCP negative.  Labs (2/17): K 4.1, creatinine 1.48, BNP 487  PMH: 1. Cardiolite (9/16) with EF 54%, no ischemia/infarction. 2. Chronic diastolic CHF: Echo (11/16) with EF 55-60%, normal RV size and systolic function, PA systolic pressure 66 mmHg.  3. Chronic atrial fibrillation: She has been intolerant of Multaq and propafenone.  Sotalol and flecainide have not been used due to CKD.   4. SBO: Managed conservatively. 5. CKD 6. OA: h/o THR and TKR.  7. H/o hyponatremia 8. Pneumonia with parapneumonic effusion with thoracenteses and eventual VATS and decortication.  9. Pulmonary hypertension: Suspect Group 1 pulmonary hypertension.  RHC (12/16) with mean RA 5, PA 62/22 mean 40, mean PCWP 19, CI 2.46, PVR 5 WU.  PFTs (12/16) wiith FVC 71%, FEV1 73%, ratio 104%, TLC 71%, DLCO 53% => moderate restriction concerning for interstitial lung disease.  High resolution CT (12/16) did not show evidence for interstitial lung disease.  V/Q scan (12/16) did not show evidence for chronic PE.  Serologies: RF negative, scleroderma antibodies negative, ANA positive but only 1:80 titer, SSA/B negative, dsDNA negative, CCP negative.  - 6 minute walk (12/16) with 244 meters - 6  minute walk (2/17) with 232 meters  SH: Married, lives in [place], nonsmoker, no ETOH.   FH: Brother with rheumatic fever.   ROS: All systems reviewed and negative except as per HPI.   Current Outpatient Prescriptions  Medication Sig Dispense Refill  . estrogens, conjugated, (PREMARIN) 0.3 MG tablet Take 0.3 mg by mouth daily. Take daily for 21 days then do not take for 7 days.    . furosemide (LASIX) 40 MG tablet Take  1 tablet (40 mg total) by mouth 2 (two) times daily. 180 tablet 3  . metoprolol succinate (TOPROL-XL) 50 MG 24 hr tablet Take 1 tablet (50 mg total) by mouth daily. 90 tablet 3  . nortriptyline (PAMELOR) 10 MG capsule Take one or two at bedtime 60 capsule 5  . potassium chloride (K-DUR) 10 MEQ tablet Take 2 tablets (20 mEq total) by mouth daily. 180 tablet 6  . Probiotic Product (PROBIOTIC & ACIDOPHILUS EX ST PO) Take 1 tablet by mouth daily.    . Rivaroxaban (XARELTO) 15 MG TABS tablet Take 1 tablet (15 mg total) by mouth daily with supper. 90 tablet 3  . sildenafil (REVATIO) 20 MG tablet Take 1 tablet (20 mg total) by mouth 3 (three) times daily. 90 tablet 0  . Tapentadol HCl (NUCYNTA ER) 50 MG TB12 Take 50 mg by mouth 2 (two) times daily as needed. 60 tablet 0  . ambrisentan (LETAIRIS) 5 MG tablet Take 1 tablet (5 mg total) by mouth daily. 30 tablet 6   No current facility-administered medications for this encounter.   BP 178/64 mmHg  Pulse 78  Wt 136 lb (61.689 kg)  SpO2 96% General: NAD Neck: JVP 8-9 cm with HJR, no thyromegaly or thyroid nodule.  Lungs: Clear to auscultation bilaterally with normal respiratory effort. CV: Nondisplaced PMI.  Heart irregular S1/S2, no S3/S4.  1/6 HSM LLSB.  Trace edema.  No carotid bruit.  Normal pedal pulses.  Abdomen: Soft, nontender, no hepatosplenomegaly, no distention.  Skin: Intact without lesions or rashes.  Neurologic: Alert and oriented x 3.  Psych: Normal affect. Extremities: No clubbing or cyanosis.  HEENT: Normal.   Assessment/Plan:  1. Chronic diastolic CHF:  NYHA class III symptoms.  I think that she still has some volume overload on exam.  She is taking Lasix 40 qam/20 qpm currently.  - Increase Lasix to 40 mg bid. BMET/BNP in 2 wks.  2. Pulmonary hypertension: Moderate to severe pulmonary hypertension by echo (PASP 66 mmHg). RHC showed moderate pulmonary arterial hypertension with PVR 5 WU.  PFTs with restrictive changes suggestive  of interstitial process but high resolution CT did not show interstitial lung disease.  No evidence for chronic PE on V/Q scan.  Rheumatologic serologies all negative.  Possible group 1 pulmonary hypertension.  She was unable to tolerate macitentan.  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  She is now on Revatio 20 mg tid.  - Continue Revatio 20 mg tid.  Not much effect so far, stable 6 minute walk.  - I would like to add a 2nd agent => will try ambrisentan 5 mg daily.  She did not tolerate macitentan.  If she cannot take ambrisentan will consider Selexipag.  3. Pulmonary: s/p PNA with parapneumonic effusion and eventual VATS with decortication.  As above, restrictive PFTs but no ILD on high resolution CT.  4. Atrial fibrillation: Chronic, apparently > 1 year.   - Will need to consider cardioversion on Tikosyn.  I will see how well she can be compensated with  diuresis and treatment of pulmonary hypertension, and if she continues to have CHF symptoms/signs, would consider attempting DCCV with Tikosyn to maintain NSR.  - Continue Xarelto and Toprol XL.  5. HTN: BP running high at times but seems to vary a lot.  Asked her to bring BP log to next appt.   Followup in 4 wks.   [name] 02/26/2015

## 2015-03-03 ENCOUNTER — Telehealth (HOSPITAL_COMMUNITY): Payer: Self-pay | Admitting: Vascular Surgery

## 2015-03-03 DIAGNOSIS — I351 Nonrheumatic aortic (valve) insufficiency: Secondary | ICD-10-CM

## 2015-03-03 DIAGNOSIS — R0602 Shortness of breath: Secondary | ICD-10-CM

## 2015-03-03 MED ORDER — POTASSIUM CHLORIDE ER 20 MEQ PO TBCR: 20.0000 meq | EXTENDED_RELEASE_TABLET | Freq: Two times a day (BID) | ORAL | Status: DC

## 2015-03-03 MED ORDER — FUROSEMIDE 40 MG PO TABS: 60.0000 mg | ORAL_TABLET | Freq: Two times a day (BID) | ORAL | Status: DC

## 2015-03-03 NOTE — Telephone Encounter (Signed)
Spoke w/pt she states she has been having increase SOB since Fri and her wt is up 10 lbs.  Thur 135 Fri 136 Sat 140 Sun 140 Mon 145  She denies LE edema, but states her abd is bloated.  She reports she gets very SOB with very little activity.  She was seen on 2/22 and Lasix was increased to 40 mg Twice daily, she states she has been taking that dose and has not missed any, she also started on Letairis 5 mg daily on 2/22.  She states she did eat some fish yesterday but only a small amount and denies drinking too much.  Discussed all above with Dr Shirlee Latch, he recommends pt increase Lasix to 80 mg bid for 3 days then take 60 mg bid, increase KCL to 20 meq bid and f/u with labs on Fri.  Spoke w/pt, she is aware, agreeable and verbalizes understanding.  appt sch for fri at 11:30

## 2015-03-03 NOTE — Telephone Encounter (Signed)
PT left message on scheduling lin,she states she needs to see Shirlee Latch today she is having extreme SOB.Marland Kitchen Please advise

## 2015-03-05 ENCOUNTER — Institutional Professional Consult (permissible substitution): Payer: Medicare Other | Admitting: Internal Medicine

## 2015-03-07 ENCOUNTER — Ambulatory Visit (HOSPITAL_COMMUNITY)
Admission: RE | Admit: 2015-03-07 | Discharge: 2015-03-07 | Disposition: A | Discharge status: Home / Self Care | Payer: Medicare Other | Source: Ambulatory Visit | Attending: Cardiology | Admitting: Cardiology

## 2015-03-07 ENCOUNTER — Ambulatory Visit (HOSPITAL_COMMUNITY): Admission: RE | Admit: 2015-03-07 | Payer: Medicare Other | Source: Ambulatory Visit

## 2015-03-07 VITALS — BP 132/70 | HR 66 | Wt 145.8 lb

## 2015-03-07 DIAGNOSIS — Z8701 Personal history of pneumonia (recurrent): Secondary | ICD-10-CM | POA: Insufficient documentation

## 2015-03-07 DIAGNOSIS — I482 Chronic atrial fibrillation: Secondary | ICD-10-CM | POA: Diagnosis not present

## 2015-03-07 DIAGNOSIS — Z7902 Long term (current) use of antithrombotics/antiplatelets: Secondary | ICD-10-CM | POA: Diagnosis not present

## 2015-03-07 DIAGNOSIS — I130 Hypertensive heart and chronic kidney disease with heart failure and stage 1 through stage 4 chronic kidney disease, or unspecified chronic kidney disease: Secondary | ICD-10-CM | POA: Diagnosis not present

## 2015-03-07 DIAGNOSIS — Z79899 Other long term (current) drug therapy: Secondary | ICD-10-CM | POA: Insufficient documentation

## 2015-03-07 DIAGNOSIS — Z96659 Presence of unspecified artificial knee joint: Secondary | ICD-10-CM | POA: Insufficient documentation

## 2015-03-07 DIAGNOSIS — I5032 Chronic diastolic (congestive) heart failure: Secondary | ICD-10-CM | POA: Diagnosis not present

## 2015-03-07 DIAGNOSIS — N189 Chronic kidney disease, unspecified: Secondary | ICD-10-CM | POA: Insufficient documentation

## 2015-03-07 DIAGNOSIS — Z96649 Presence of unspecified artificial hip joint: Secondary | ICD-10-CM | POA: Diagnosis not present

## 2015-03-07 DIAGNOSIS — R0602 Shortness of breath: Secondary | ICD-10-CM | POA: Diagnosis present

## 2015-03-07 DIAGNOSIS — I272 Other secondary pulmonary hypertension: Secondary | ICD-10-CM | POA: Insufficient documentation

## 2015-03-07 LAB — BASIC METABOLIC PANEL
ANION GAP: 10 (ref 5–15)
BUN: 26 mg/dL — ABNORMAL HIGH (ref 6–20)
CALCIUM: 9.3 mg/dL (ref 8.9–10.3)
CO2: 25 mmol/L (ref 22–32)
Chloride: 102 mmol/L (ref 101–111)
Creatinine, Ser: 1.42 mg/dL — ABNORMAL HIGH (ref 0.44–1.00)
GFR, EST AFRICAN AMERICAN: 38 mL/min — AB (ref >60)
GFR, EST NON AFRICAN AMERICAN: 33 mL/min — AB (ref >60)
Glucose, Bld: 85 mg/dL (ref 65–99)
POTASSIUM: 4 mmol/L (ref 3.5–5.1)
Sodium: 137 mmol/L (ref 135–145)

## 2015-03-07 LAB — BRAIN NATRIURETIC PEPTIDE: B NATRIURETIC PEPTIDE 5: 364.7 pg/mL — AB (ref 0.0–100.0)

## 2015-03-07 MED ORDER — TORSEMIDE 20 MG PO TABS: 40.0000 mg | ORAL_TABLET | Freq: Two times a day (BID) | ORAL | Status: DC

## 2015-03-07 NOTE — Progress Notes (Signed)
Advanced Heart Failure Medication Review by a Pharmacist  Does the patient  feel that his/her medications are working for him/her?  yes  Has the patient been experiencing any side effects to the medications prescribed?  yes  Does the patient measure his/her own blood pressure or blood glucose at home?  yes   Does the patient have any problems obtaining medications due to transportation or finances?   no  Understanding of regimen: good Understanding of indications: good Potential of compliance: good Patient understands to avoid NSAIDs. Patient understands to avoid decongestants.  Issues to address at subsequent visits: None   Pharmacist comments:  Mrs. Deskin is a pleasant 82 yo F presenting with her husband but without a medication list. She has good recall of her regimen including dosages. She does state that since she started [place] last Wednesday she developed increasing facial and abdominal swelling/edema that improved after discontinuation this past Monday. She has been tolerating sildenafil well.   Tyler DeisErika K. Bonnye FavaNicolsen, PharmD, BCPS, CPP Clinical Pharmacist Pager: [phone] Phone: [phone] 03/07/2015 11:12 AM      Time with patient: 16 minutes Preparation and documentation time: 2 minutes Total time: 18 minutes

## 2015-03-07 NOTE — Patient Instructions (Signed)
Stop Letaris.  Stop Lasix.  Continue taking Potassium 20meq twice daily.  Start Torsemide 40mg  twice daily.  Routine lab work today. Will notify you of abnormal results  Follow up in 1 week with Dr.McLean

## 2015-03-09 NOTE — Progress Notes (Signed)
Patient ID: Rachael Myers, female   DOB: 02-27-1929, 82 y.o.   MRN: [id number] PCP: Dr. Royanne Foots Cardiology: Dr. Excell Seltzer HF Cardiology: Dr. Shirlee Latch  82 yo with chronic diastolic CHF, chronic atrial fibrillation, and prominent exertional dyspnea presents to CHF clinic for evaluation.  She has had atrial fibrillation for over a year and failed Multaq and propafenone use.  She is in atrial fibrillation chronically now.  In 1/16, she developed PNA.  She had a parapneumonic effusion that required 4 thoracenteses.  Eventually, she had VATS with decortication and a wedge resection.  Cytology was negative from the pleural fluid.  Last chest CT was at [facility] in 8/16 and showed no PE, mild interstitial edema.  Last echo was done in 11/16, showing normal LV EF and apparently normal RV EF.  PA systolic pressure estimation was moderate to severely increased.  I took her for RHC in 12/16, this showed moderate pulmonary arterial hypertension.  PFTs showed a restrictive pattern concerning for interstitial lung disease.  V/Q scan in 12/16 was not suggestive of chronic PE.  Despite abnormal PFTs, high resolution CT did not show interstitial lung disease.    At a prior appointment, I started her on Opsumit.  She developed a cough and mouth soreness with this medication and had to stop it after about 7 days.  Symptoms resolved when she stopped it.  I tried to get Adcirca for her.  This was denied by her insurance company, so she was started on Revatio 20 mg tid instead.  She thinks that this has helped her breathing "a little."  She saw a pulmonary specialist who thought dyspnea was likely due to [facility].  At last appointment, I tried her on ambrisentan, but she developed facial swelling on ambrisentan (significant around the eyes).  She also developed worsening dyspnea.  She stopped ambrisentan and the swelling resolved but she is still more short of breath.  Today, weight is up 9 lbs compared to prior appt.  She has dyspnea  walking short distances, just around the house.  She has orthopnea.  No lightheadedness/falls.  No chest pain.  I increased her Lasix earlier this week to 60 mg bid but she says that this did not help much.   Labs (9/16): K 4, creatinine 1.31, BNP 466 Labs (11/16): BNP 577 Labs (12/16); K 4, creatinine 1.3, normal rheumatoid factor and scleroderma serologies Labs (1/17): ANA positive but only 1:80 titer, dsDNA negative, SSA/B negative, CCP negative.  Labs (2/17): K 4.1, creatinine 1.48, BNP 487  PMH: 1. Cardiolite (9/16) with EF 54%, no ischemia/infarction. 2. Chronic diastolic CHF: Echo (11/16) with EF 55-60%, normal RV size and systolic function, PA systolic pressure 66 mmHg.  3. Chronic atrial fibrillation: She has been intolerant of Multaq and propafenone.  Sotalol and flecainide have not been used due to CKD.   4. SBO: Managed conservatively. 5. CKD 6. OA: h/o THR and TKR.  7. H/o hyponatremia 8. Pneumonia with parapneumonic effusion with thoracenteses and eventual VATS and decortication.  9. Pulmonary hypertension: Suspect Group 1 pulmonary hypertension.  RHC (12/16) with mean RA 5, PA 62/22 mean 40, mean PCWP 19, CI 2.46, PVR 5 WU.  PFTs (12/16) wiith FVC 71%, FEV1 73%, ratio 104%, TLC 71%, DLCO 53% => moderate restriction concerning for interstitial lung disease.  High resolution CT (12/16) did not show evidence for interstitial lung disease.  V/Q scan (12/16) did not show evidence for chronic PE.  Serologies: RF negative, scleroderma antibodies negative, ANA positive  but only 1:80 titer, SSA/B negative, dsDNA negative, CCP negative.  She did not tolerate macitentan or ambrisentan.  - 6 minute walk (12/16) with 244 meters - 6 minute walk (2/17) with 232 meters  SH: Married, lives in [place], nonsmoker, no ETOH.   FH: Brother with rheumatic fever.   ROS: All systems reviewed and negative except as per HPI.   Current Outpatient Prescriptions  Medication Sig Dispense Refill  .  estrogens, conjugated, (PREMARIN) 0.3 MG tablet Take 0.3 mg by mouth daily. Take daily for 21 days then do not take for 7 days.    . metoprolol succinate (TOPROL-XL) 50 MG 24 hr tablet Take 1 tablet (50 mg total) by mouth daily. 90 tablet 3  . potassium chloride 20 MEQ TBCR Take 20 mEq by mouth 2 (two) times daily.    . Probiotic Product (PROBIOTIC & ACIDOPHILUS EX ST PO) Take 1 tablet by mouth daily.    . Rivaroxaban (XARELTO) 15 MG TABS tablet Take 1 tablet (15 mg total) by mouth daily with supper. 90 tablet 3  . sildenafil (REVATIO) 20 MG tablet Take 1 tablet (20 mg total) by mouth 3 (three) times daily. 90 tablet 0  . torsemide (DEMADEX) 20 MG tablet Take 2 tablets (40 mg total) by mouth 2 (two) times daily. 120 tablet 3   No current facility-administered medications for this encounter.   BP 132/70 mmHg  Pulse 66  Wt 145 lb 12.8 oz (66.134 kg)  SpO2 95% General: NAD Neck: JVP 12 cm with HJR, no thyromegaly or thyroid nodule.  Lungs: Clear to auscultation bilaterally with normal respiratory effort. CV: Nondisplaced PMI.  Heart irregular S1/S2, no S3/S4.  1/6 HSM LLSB.  No edema.  No carotid bruit.  Normal pedal pulses.  Abdomen: Soft, nontender, no hepatosplenomegaly, no distention.  Skin: Intact without lesions or rashes.  Neurologic: Alert and oriented x 3.  Psych: Normal affect. Extremities: No clubbing or cyanosis.  HEENT: Normal.   Assessment/Plan:  1. Chronic diastolic CHF:  NYHA class IIIb symptoms.  She is more volume overloaded today and weight is up. - Stop Lasix, start torsemide 40 mg bid.  She will need followup next week to assess response. - BMET/BNP today and repeat in 1 week.  2. Pulmonary hypertension: Moderate to severe pulmonary hypertension by echo (PASP 66 mmHg). RHC showed moderate pulmonary arterial hypertension with PVR 5 WU.  PFTs with restrictive changes suggestive of interstitial process but high resolution CT did not show interstitial lung disease.  No  evidence for chronic PE on V/Q scan.  Rheumatologic serologies all negative.  Possible group 1 pulmonary hypertension.  She was unable to tolerate macitentan or ambrisentan.  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  She is now on Revatio 20 mg tid.  - Continue Revatio 20 mg tid.   - I would like to add a 2nd agent => She has not tolerated ERAs.  When we have diuresed her effectively, I will try to get her on selexipag.  3. Pulmonary: s/p PNA with parapneumonic effusion and eventual VATS with decortication.  As above, restrictive PFTs but no ILD on high resolution CT.  4. Atrial fibrillation: Chronic, apparently > 1 year.   - Will need to consider cardioversion on Tikosyn.  I will see how well she can be compensated with diuresis and treatment of pulmonary hypertension, and if she continues to have CHF symptoms/signs, would consider attempting DCCV with Tikosyn to maintain NSR.  - Continue Xarelto and Toprol XL.  5. HTN:  BP has been lower recently.   Followup in 1 week.   Marca AnconaDalton Gabriella Guile 03/09/2015

## 2015-03-14 ENCOUNTER — Ambulatory Visit (HOSPITAL_COMMUNITY)
Admission: RE | Admit: 2015-03-14 | Discharge: 2015-03-14 | Disposition: A | Discharge status: Home / Self Care | Payer: Medicare Other | Source: Ambulatory Visit | Attending: Internal Medicine | Admitting: Internal Medicine

## 2015-03-14 ENCOUNTER — Encounter (HOSPITAL_COMMUNITY): Payer: Self-pay

## 2015-03-14 ENCOUNTER — Encounter (HOSPITAL_COMMUNITY): Payer: Self-pay | Admitting: Internal Medicine

## 2015-03-14 VITALS — BP 110/64 | HR 63 | Wt 138.0 lb

## 2015-03-14 DIAGNOSIS — Z96649 Presence of unspecified artificial hip joint: Secondary | ICD-10-CM | POA: Insufficient documentation

## 2015-03-14 DIAGNOSIS — I130 Hypertensive heart and chronic kidney disease with heart failure and stage 1 through stage 4 chronic kidney disease, or unspecified chronic kidney disease: Secondary | ICD-10-CM | POA: Diagnosis not present

## 2015-03-14 DIAGNOSIS — I272 Other secondary pulmonary hypertension: Secondary | ICD-10-CM | POA: Diagnosis not present

## 2015-03-14 DIAGNOSIS — Z7901 Long term (current) use of anticoagulants: Secondary | ICD-10-CM | POA: Insufficient documentation

## 2015-03-14 DIAGNOSIS — Z79899 Other long term (current) drug therapy: Secondary | ICD-10-CM | POA: Diagnosis not present

## 2015-03-14 DIAGNOSIS — N189 Chronic kidney disease, unspecified: Secondary | ICD-10-CM | POA: Insufficient documentation

## 2015-03-14 DIAGNOSIS — I5032 Chronic diastolic (congestive) heart failure: Secondary | ICD-10-CM | POA: Diagnosis present

## 2015-03-14 DIAGNOSIS — I482 Chronic atrial fibrillation: Secondary | ICD-10-CM | POA: Insufficient documentation

## 2015-03-14 DIAGNOSIS — Z96659 Presence of unspecified artificial knee joint: Secondary | ICD-10-CM | POA: Insufficient documentation

## 2015-03-14 LAB — BASIC METABOLIC PANEL
Anion gap: 12 (ref 5–15)
BUN: 33 mg/dL — AB (ref 6–20)
CALCIUM: 9.3 mg/dL (ref 8.9–10.3)
CHLORIDE: 98 mmol/L — AB (ref 101–111)
CO2: 27 mmol/L (ref 22–32)
CREATININE: 1.58 mg/dL — AB (ref 0.44–1.00)
GFR calc non Af Amer: 29 mL/min — ABNORMAL LOW (ref >60)
GFR, EST AFRICAN AMERICAN: 34 mL/min — AB (ref >60)
Glucose, Bld: 82 mg/dL (ref 65–99)
Potassium: 4.2 mmol/L (ref 3.5–5.1)
SODIUM: 137 mmol/L (ref 135–145)

## 2015-03-14 LAB — BRAIN NATRIURETIC PEPTIDE: B Natriuretic Peptide: 325.9 pg/mL — ABNORMAL HIGH (ref 0.0–100.0)

## 2015-03-14 MED ORDER — SELEXIPAG 200 MCG PO TABS: 1.0000 | ORAL_TABLET | Freq: Two times a day (BID) | ORAL | Status: DC

## 2015-03-14 NOTE — Progress Notes (Signed)
Patient ID: Rachael BergeronBetty Rodenberg, female   DOB: 1929-05-13, 82 y.o.   MRN: [id number] PCP: Dr. Royanne Footsichard Orr Cardiology: Dr. Excell Seltzerooper HF Cardiology: Dr. Shirlee LatchMcLean  82 yo with chronic diastolic CHF, chronic atrial fibrillation, and prominent exertional dyspnea presents to CHF clinic for evaluation.  She has had atrial fibrillation for over a year and failed Multaq and propafenone use.  She is in atrial fibrillation chronically now.  In 1/16, she developed PNA.  She had a parapneumonic effusion that required 4 thoracenteses.  Eventually, she had VATS with decortication and a wedge resection.  Cytology was negative from the pleural fluid.  [facility] CT [facility] in 8/16 and showed no PE, mild interstitial edema.  Last echo was done in 11/16, showing normal LV EF and apparently normal RV EF.  PA systolic pressure estimation was moderate to severely increased.  I took her for RHC in 12/16, this showed moderate pulmonary arterial hypertension.  PFTs showed a restrictive pattern concerning for interstitial lung disease.  V/Q scan in 12/16 was not suggestive of chronic PE.  Despite abnormal PFTs, high resolution CT did not show interstitial lung disease.    At a prior appointment, I started her on Opsumit.  She developed a cough and mouth soreness with this medication and had to stop it after about 7 days.  Symptoms resolved when she stopped it.  I tried to get Adcirca for her.  This was denied by her insurance company, so she was started on Revatio 20 mg tid instead.  She thinks that this has helped her breathing "a little."  She saw a pulmonary specialist who [facility].  Next, I tried her on ambrisentan, but she developed facial swelling on ambrisentan (significant around the eyes).  She also developed worsening dyspnea.  She stopped ambrisentan and the swelling resolved but she was noted to be volume overloaded.  I switched her Lasix to torsemide 40 mg bid.  Weight is down 7 lbs and she feels  much better today.  She did not have to stop when walking in today and was able to go back to cardiac rehab. 6 minute walk much improved.   Labs (9/16): K 4, creatinine 1.31, BNP 466 Labs (11/16): BNP 577 Labs (12/16); K 4, creatinine 1.3, normal rheumatoid factor and scleroderma serologies Labs (1/17): ANA positive but only 1:80 titer, dsDNA negative, SSA/B negative, CCP negative.  Labs (2/17): K 4.1, creatinine 1.48 => 1.42, BNP 487  PMH: 1. Cardiolite (9/16) with EF 54%, no ischemia/infarction. 2. Chronic diastolic CHF: Echo (11/16) with EF 55-60%, normal RV size and systolic function, PA systolic pressure 66 mmHg.  3. Chronic atrial fibrillation: She has been intolerant of Multaq and propafenone.  Sotalol and flecainide have not been used due to CKD.   4. SBO: Managed conservatively. 5. CKD 6. OA: h/o THR and TKR.  7. H/o hyponatremia 8. Pneumonia with parapneumonic effusion with thoracenteses and eventual VATS and decortication.  9. Pulmonary hypertension: Suspect Group 1 pulmonary hypertension.  RHC (12/16) with mean RA 5, PA 62/22 mean 40, mean PCWP 19, CI 2.46, PVR 5 WU.  PFTs (12/16) wiith FVC 71%, FEV1 73%, ratio 104%, TLC 71%, DLCO 53% => moderate restriction concerning for interstitial lung disease.  High resolution CT (12/16) did not show evidence for interstitial lung disease.  V/Q scan (12/16) did not show evidence for chronic PE.  Serologies: RF negative, scleroderma antibodies negative, ANA positive but only 1:80 titer, SSA/B negative, dsDNA  negative, CCP negative.  She did not tolerate macitentan or ambrisentan.  - 6 minute walk (12/16) with 244 meters - 6 minute walk (2/17) with 232 meters - 6 minute walk (3/17) with 414 meters  SH: Married, lives in [place], nonsmoker, no ETOH.   FH: Brother with rheumatic fever.   ROS: All systems reviewed and negative except as per HPI.   Current Outpatient Prescriptions  Medication Sig Dispense Refill  . estrogens, conjugated,  (PREMARIN) 0.3 MG tablet Take 0.3 mg by mouth daily. Take daily for 21 days then do not take for 7 days.    . metoprolol succinate (TOPROL-XL) 50 MG 24 hr tablet Take 1 tablet (50 mg total) by mouth daily. 90 tablet 3  . potassium chloride 20 MEQ TBCR Take 20 mEq by mouth 2 (two) times daily.    . Probiotic Product (PROBIOTIC & ACIDOPHILUS EX ST PO) Take 1 tablet by mouth daily.    . Rivaroxaban (XARELTO) 15 MG TABS tablet Take 1 tablet (15 mg total) by mouth daily with supper. 90 tablet 3  . sildenafil (REVATIO) 20 MG tablet Take 1 tablet (20 mg total) by mouth 3 (three) times daily. 90 tablet 0  . torsemide (DEMADEX) 20 MG tablet Take 2 tablets (40 mg total) by mouth 2 (two) times daily. 120 tablet 3  . Selexipag (UPTRAVI) 200 MCG TABS Take 1 tablet (200 mcg total) by mouth 2 (two) times daily. 60 tablet    No current facility-administered medications for this encounter.   BP 110/64 mmHg  Pulse 63  Wt 138 lb (62.596 kg)  SpO2 98% General: NAD Neck: JVP 7 cm, no thyromegaly or thyroid nodule.  Lungs: Clear to auscultation bilaterally with normal respiratory effort. CV: Nondisplaced PMI.  Heart irregular S1/S2, no S3/S4.  1/6 HSM LLSB.  No edema.  No carotid bruit.  Normal pedal pulses.  Abdomen: Soft, nontender, no hepatosplenomegaly, no distention.  Skin: Intact without lesions or rashes.  Neurologic: Alert and oriented x 3.  Psych: Normal affect. Extremities: No clubbing or cyanosis.  HEENT: Normal.   Assessment/Plan:  1. Chronic diastolic CHF:  NYHA class II-III symptoms.  Volume status looks much better.  - Continue current torsemide.  Check BMET today. 2. Pulmonary hypertension: Moderate to severe pulmonary hypertension by echo (PASP 66 mmHg). RHC showed moderate pulmonary arterial hypertension with PVR 5 WU.  PFTs with restrictive changes suggestive of interstitial process but high resolution CT did not show interstitial lung disease.  No evidence for chronic PE on V/Q scan.   Rheumatologic serologies all negative.  Possible group 1 pulmonary hypertension.  She was unable to tolerate macitentan or ambrisentan.  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  She is now on Revatio 20 mg tid.  - Continue Revatio 20 mg tid.   - I would like to add a 2nd agent => She has not tolerated ERAs.  Now that she is well-diuresed, I will try to get her on selexipag.  3. Pulmonary: s/p PNA with parapneumonic effusion and eventual VATS with decortication.  As above, restrictive PFTs but no ILD on high resolution CT.  4. Atrial fibrillation: Chronic, apparently > 1 year.   - Will need to consider cardioversion on Tikosyn.  I will see how well she can be compensated with diuresis and treatment of pulmonary hypertension, and if she continues to have CHF symptoms/signs, would consider attempting DCCV with Tikosyn to maintain NSR.  - Continue Xarelto and Toprol XL.  5. HTN: BP has been lower recently.  Followup in 1 month.   [name] 03/14/2015

## 2015-03-14 NOTE — Patient Instructions (Addendum)
Routine lab work today. Will notify you of abnormal results, otherwise no news is good news!  We will contact you once you have been approved to start new medications SELEXAPEG.  Follow up 1 month with Dr. Shirlee LatchMcLean.  Do the following things EVERYDAY: 1) Weigh yourself in the morning before breakfast. Write it down and keep it in a log. 2) Take your medicines as prescribed 3) Eat low salt foods-Limit salt (sodium) to 2000 mg per day.  4) Stay as active as you can everyday 5) Limit all fluids for the day to less than 2 liters

## 2015-03-14 NOTE — Progress Notes (Signed)
6 Minute Walk Start 98% 70HR  Patient ambulated at a quick, steady pace and gait without any rest breaks or shortness of breath for a total of 1160 ft (353.568 meters).  End 90% HR 89  Dr. Shirlee LatchMcLean made aware of results.  Rachael Myers, Rachael Myers

## 2015-03-26 ENCOUNTER — Encounter (HOSPITAL_COMMUNITY): Payer: Medicare Other

## 2015-04-09 ENCOUNTER — Ambulatory Visit (HOSPITAL_COMMUNITY)
Admission: RE | Admit: 2015-04-09 | Discharge: 2015-04-09 | Disposition: A | Discharge status: Home / Self Care | Payer: Medicare Other | Source: Ambulatory Visit | Attending: Cardiology | Admitting: Cardiology

## 2015-04-09 ENCOUNTER — Encounter (HOSPITAL_COMMUNITY): Payer: Self-pay

## 2015-04-09 VITALS — BP 138/74 | HR 64 | Wt 141.0 lb

## 2015-04-09 DIAGNOSIS — N183 Chronic kidney disease, stage 3 unspecified: Secondary | ICD-10-CM

## 2015-04-09 DIAGNOSIS — Z96659 Presence of unspecified artificial knee joint: Secondary | ICD-10-CM | POA: Insufficient documentation

## 2015-04-09 DIAGNOSIS — I5032 Chronic diastolic (congestive) heart failure: Secondary | ICD-10-CM | POA: Diagnosis not present

## 2015-04-09 DIAGNOSIS — N189 Chronic kidney disease, unspecified: Secondary | ICD-10-CM | POA: Diagnosis not present

## 2015-04-09 DIAGNOSIS — I272 Other secondary pulmonary hypertension: Secondary | ICD-10-CM

## 2015-04-09 DIAGNOSIS — Z79899 Other long term (current) drug therapy: Secondary | ICD-10-CM | POA: Insufficient documentation

## 2015-04-09 DIAGNOSIS — Z96649 Presence of unspecified artificial hip joint: Secondary | ICD-10-CM | POA: Insufficient documentation

## 2015-04-09 DIAGNOSIS — Z7901 Long term (current) use of anticoagulants: Secondary | ICD-10-CM | POA: Insufficient documentation

## 2015-04-09 DIAGNOSIS — I130 Hypertensive heart and chronic kidney disease with heart failure and stage 1 through stage 4 chronic kidney disease, or unspecified chronic kidney disease: Secondary | ICD-10-CM | POA: Insufficient documentation

## 2015-04-09 DIAGNOSIS — I482 Chronic atrial fibrillation, unspecified: Secondary | ICD-10-CM

## 2015-04-09 NOTE — Patient Instructions (Addendum)
Routine lab work today. Will notify you of abnormal results  You have been referred to Pulmonary Rehab in [place].   Follow up in 1 month with Dr.McLean

## 2015-04-09 NOTE — Progress Notes (Signed)
Patient ID: Rachael Myers, female   DOB: 1929/10/28, 82 y.o.   MRN: [id number] PCP: Dr. Royanne Foots Cardiology: Dr. Excell Seltzer HF Cardiology: Dr. Shirlee Latch  82 yo with chronic diastolic CHF, chronic atrial fibrillation, and prominent exertional dyspnea presents to CHF clinic for evaluation.  She has had atrial fibrillation for over a year and failed Multaq and propafenone use.  She is in atrial fibrillation chronically now.  In 1/16, she developed PNA.  She had a parapneumonic effusion that required 4 thoracenteses.  Eventually, she had VATS with decortication and a wedge resection.  Cytology was negative from the pleural fluid.  Last chest CT was at [facility] in 8/16 and showed no PE, mild interstitial edema.  Last echo was done in 11/16, showing normal LV EF and apparently normal RV EF.  PA systolic pressure estimation was moderate to severely increased.  I took her for RHC in 12/16, this showed moderate pulmonary arterial hypertension.  PFTs showed a restrictive pattern concerning for interstitial lung disease.  V/Q scan in 12/16 was not suggestive of chronic PE.  Despite abnormal PFTs, high resolution CT did not show interstitial lung disease.    At a prior appointment, I started her on Opsumit.  She developed a cough and mouth soreness with this medication and had to stop it after about 7 days.  Symptoms resolved when she stopped it.  I tried to get Adcirca for her.  This was denied by her insurance company, so she was started on Revatio 20 mg tid instead.  She thinks that this has helped her breathing "a little."  She saw a pulmonary specialist who thought dyspnea was likely due to [facility].  Next, I tried her on ambrisentan, but she developed facial swelling on ambrisentan (significant around the eyes).  She also developed worsening dyspnea.  She stopped ambrisentan and the swelling resolved but she was noted to be volume overloaded.  I switched her Lasix to torsemide 40 mg bid.   Since last appointment, she has  started on selexipag.  So far, she is tolerating this.  She is able to walk further in the past but still gets short of breath after walking about 30-40 feet. Walked into the office today, however, without stopping.  Occasional lightheadedness if she stands up too quickly. Weight is up about 3 lbs.   Labs (9/16): K 4, creatinine 1.31, BNP 466 Labs (11/16): BNP 577 Labs (12/16); K 4, creatinine 1.3, normal rheumatoid factor and scleroderma serologies Labs (1/17): ANA positive but only 1:80 titer, dsDNA negative, SSA/B negative, CCP negative.  Labs (2/17): K 4.1, creatinine 1.48 => 1.42, BNP 487 Labs (3/17): K 4.2, creatinine 1.58, BNP 326  PMH: 1. Cardiolite (9/16) with EF 54%, no ischemia/infarction. 2. Chronic diastolic CHF: Echo (11/16) with EF 55-60%, normal RV size and systolic function, PA systolic pressure 66 mmHg.  3. Chronic atrial fibrillation: She has been intolerant of Multaq and propafenone.  Sotalol and flecainide have not been used due to CKD.   4. SBO: Managed conservatively. 5. CKD 6. OA: h/o THR and TKR.  7. H/o hyponatremia 8. Pneumonia with parapneumonic effusion with thoracenteses and eventual VATS and decortication.  9. Pulmonary hypertension: Suspect Group 1 pulmonary hypertension.  RHC (12/16) with mean RA 5, PA 62/22 mean 40, mean PCWP 19, CI 2.46, PVR 5 WU.  PFTs (12/16) wiith FVC 71%, FEV1 73%, ratio 104%, TLC 71%, DLCO 53% => moderate restriction concerning for interstitial lung disease.  High resolution CT (12/16) did not show  evidence for interstitial lung disease.  V/Q scan (12/16) did not show evidence for chronic PE.  Serologies: RF negative, scleroderma antibodies negative, ANA positive but only 1:80 titer, SSA/B negative, dsDNA negative, CCP negative.  She did not tolerate macitentan or ambrisentan.  - 6 minute walk (12/16) with 244 meters - 6 minute walk (2/17) with 232 meters - 6 minute walk (3/17) with 414 meters  SH: Married, lives in [place],  nonsmoker, no ETOH.   FH: Brother with rheumatic fever.   ROS: All systems reviewed and negative except as per HPI.   Current Outpatient Prescriptions  Medication Sig Dispense Refill  . estrogens, conjugated, (PREMARIN) 0.3 MG tablet Take 0.3 mg by mouth daily. Take daily for 21 days then do not take for 7 days.    . metoprolol succinate (TOPROL-XL) 50 MG 24 hr tablet Take 1 tablet (50 mg total) by mouth daily. 90 tablet 3  . potassium chloride 20 MEQ TBCR Take 20 mEq by mouth 2 (two) times daily.    . Probiotic Product (PROBIOTIC & ACIDOPHILUS EX ST PO) Take 1 tablet by mouth daily.    . Rivaroxaban (XARELTO) 15 MG TABS tablet Take 1 tablet (15 mg total) by mouth daily with supper. 90 tablet 3  . Selexipag (UPTRAVI) 200 MCG TABS Take 400 mcg by mouth 2 (two) times daily.    . sildenafil (REVATIO) 20 MG tablet Take 1 tablet (20 mg total) by mouth 3 (three) times daily. 90 tablet 0  . torsemide (DEMADEX) 20 MG tablet Take 2 tablets (40 mg total) by mouth 2 (two) times daily. 120 tablet 3   No current facility-administered medications for this encounter.   BP 138/74 mmHg  Pulse 64  Wt 141 lb (63.957 kg)  SpO2 100% General: NAD Neck: JVP 7 cm, no thyromegaly or thyroid nodule.  Lungs: Clear to auscultation bilaterally with normal respiratory effort. CV: Nondisplaced PMI.  Heart irregular S1/S2, no S3/S4.  1/6 HSM LLSB.  No edema.  No carotid bruit.  Normal pedal pulses.  Abdomen: Soft, nontender, no hepatosplenomegaly, no distention.  Skin: Intact without lesions or rashes.  Neurologic: Alert and oriented x 3.  Psych: Normal affect. Extremities: No clubbing or cyanosis.  HEENT: Normal.   Assessment/Plan:  1. Chronic diastolic CHF:  NYHA class II-III symptoms.  She does not look volume overloaded.  - Continue current torsemide.  BMET today.  2. Pulmonary hypertension: Moderate to severe pulmonary hypertension by echo (PASP 66 mmHg). RHC showed moderate pulmonary arterial hypertension  with PVR 5 WU.  PFTs with restrictive changes suggestive of interstitial process but high resolution CT did not show interstitial lung disease.  No evidence for chronic PE on V/Q scan.  Rheumatologic serologies all negative.  Possible group 1 pulmonary hypertension.  She was unable to tolerate macitentan or ambrisentan.  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  She is now on Revatio 20 mg tid and has started selexipag, currently up to 400 mcg bid.  - Continue Revatio 20 mg tid.   - She has tolerated selexipag so far. Continue to increase dose gradually.   - 6 minute walk next appointment in 1 month. - I will refer her for pulmonary rehab in [place].  3. Pulmonary: s/p PNA with parapneumonic effusion and eventual VATS with decortication.  As above, restrictive PFTs but no ILD on high resolution CT.  4. Atrial fibrillation: Chronic, apparently > 1 year.   - Will need to consider cardioversion on Tikosyn.  I will see how well  she can be compensated with diuresis and treatment of pulmonary hypertension, and if she continues to have CHF symptoms/signs, would consider attempting DCCV with Tikosyn to maintain NSR.  - Continue Xarelto and Toprol XL.  5. HTN: BP has been lower recently, some lightheadedness with standing.  I will have her wear graded compression stockings.   Followup in 1 month.   [name] 04/09/2015

## 2015-04-23 ENCOUNTER — Encounter (HOSPITAL_COMMUNITY): Payer: Self-pay | Admitting: *Deleted

## 2015-04-23 NOTE — Progress Notes (Signed)
Referral for Pulmonary Rehab and records faxed to [facility] at [phone]

## 2015-05-06 ENCOUNTER — Ambulatory Visit: Payer: Medicare Other | Admitting: Neurology

## 2015-05-09 ENCOUNTER — Encounter (HOSPITAL_COMMUNITY): Payer: Self-pay

## 2015-05-09 ENCOUNTER — Ambulatory Visit (HOSPITAL_COMMUNITY)
Admission: RE | Admit: 2015-05-09 | Discharge: 2015-05-09 | Disposition: A | Discharge status: Home / Self Care | Payer: Medicare Other | Source: Ambulatory Visit | Attending: Cardiology | Admitting: Cardiology

## 2015-05-09 VITALS — BP 140/68 | HR 80 | Wt 141.8 lb

## 2015-05-09 DIAGNOSIS — Z79899 Other long term (current) drug therapy: Secondary | ICD-10-CM | POA: Diagnosis not present

## 2015-05-09 DIAGNOSIS — I5032 Chronic diastolic (congestive) heart failure: Secondary | ICD-10-CM

## 2015-05-09 DIAGNOSIS — I272 Other secondary pulmonary hypertension: Secondary | ICD-10-CM | POA: Insufficient documentation

## 2015-05-09 DIAGNOSIS — R42 Dizziness and giddiness: Secondary | ICD-10-CM | POA: Insufficient documentation

## 2015-05-09 DIAGNOSIS — N189 Chronic kidney disease, unspecified: Secondary | ICD-10-CM | POA: Insufficient documentation

## 2015-05-09 DIAGNOSIS — I130 Hypertensive heart and chronic kidney disease with heart failure and stage 1 through stage 4 chronic kidney disease, or unspecified chronic kidney disease: Secondary | ICD-10-CM | POA: Insufficient documentation

## 2015-05-09 DIAGNOSIS — I351 Nonrheumatic aortic (valve) insufficiency: Secondary | ICD-10-CM

## 2015-05-09 DIAGNOSIS — Z7901 Long term (current) use of anticoagulants: Secondary | ICD-10-CM | POA: Insufficient documentation

## 2015-05-09 DIAGNOSIS — I482 Chronic atrial fibrillation: Secondary | ICD-10-CM | POA: Diagnosis not present

## 2015-05-09 DIAGNOSIS — R0602 Shortness of breath: Secondary | ICD-10-CM | POA: Diagnosis not present

## 2015-05-09 LAB — BASIC METABOLIC PANEL
Anion gap: 11 (ref 5–15)
BUN: 36 mg/dL — AB (ref 6–20)
CHLORIDE: 98 mmol/L — AB (ref 101–111)
CO2: 33 mmol/L — ABNORMAL HIGH (ref 22–32)
CREATININE: 1.78 mg/dL — AB (ref 0.44–1.00)
Calcium: 9.2 mg/dL (ref 8.9–10.3)
GFR calc Af Amer: 29 mL/min — ABNORMAL LOW (ref >60)
GFR calc non Af Amer: 25 mL/min — ABNORMAL LOW (ref >60)
GLUCOSE: 88 mg/dL (ref 65–99)
POTASSIUM: 3.6 mmol/L (ref 3.5–5.1)
Sodium: 142 mmol/L (ref 135–145)

## 2015-05-09 MED ORDER — GABAPENTIN 300 MG PO CAPS: 300.0000 mg | ORAL_CAPSULE | Freq: Three times a day (TID) | ORAL | Status: DC

## 2015-05-09 MED ORDER — RIVAROXABAN 15 MG PO TABS: 15.0000 mg | ORAL_TABLET | Freq: Every day | ORAL | Status: DC

## 2015-05-09 MED ORDER — POTASSIUM CHLORIDE ER 20 MEQ PO TBCR
20.0000 meq | EXTENDED_RELEASE_TABLET | Freq: Two times a day (BID) | ORAL | Status: DC
Start: 2015-05-09 — End: 2019-08-13

## 2015-05-09 MED ORDER — TORSEMIDE 20 MG PO TABS: 40.0000 mg | ORAL_TABLET | Freq: Two times a day (BID) | ORAL | Status: DC

## 2015-05-09 MED ORDER — METOPROLOL SUCCINATE ER 25 MG PO TB24: 25.0000 mg | ORAL_TABLET | Freq: Every day | ORAL | Status: DC

## 2015-05-09 NOTE — Progress Notes (Signed)
6MW completed with patient today. Patient ambulated 875 ft(266.777meters) rest breaks x 3 HR ranged 70-98 o2 sats ranged 80-92% on room air

## 2015-05-09 NOTE — Progress Notes (Signed)
Patient ID: Rachael BergeronBetty Myers, female   DOB: 1929/07/05, 82 y.o.   MRN: [id number]    Advanced Heart Failure Clinic Note   PCP: Dr. Royanne Footsichard Orr Cardiology: Dr. Excell Seltzerooper HF Cardiology: Dr. Shirlee LatchMcLean  82 yo with chronic diastolic CHF, chronic atrial fibrillation, and prominent exertional dyspnea presents to CHF clinic for evaluation.  She has had atrial fibrillation for over a year and failed Multaq and propafenone use.  She is in atrial fibrillation chronically now.  In 1/16, she developed PNA.  She had a parapneumonic effusion that required 4 thoracenteses.  Eventually, she had VATS with decortication and a wedge resection.  Cytology was negative from the pleural fluid.  [facility] and showed no PE, mild interstitial edema.  Last echo was done in 11/16, showing normal LV EF and apparently normal RV EF.  PA systolic pressure estimation was moderate to severely increased.  I took her for RHC in 12/16, this showed moderate pulmonary arterial hypertension.  PFTs showed a restrictive pattern concerning for interstitial lung disease.  V/Q scan in 12/16 was not suggestive of chronic PE.  Despite abnormal PFTs, high resolution CT did not show interstitial lung disease.    At a prior appointment, started her on Opsumit. She developed a cough and mouth soreness with this medication and had to stop it after about 7 days. Symptoms resolved when she stopped it. Tried to get Adcirca for her. This was denied by her insurance company, so she was started on Revatio 20 mg tid instead. She thinks that this has helped her breathing "a little." She saw a pulmonary specialist who thought dyspnea was likely [facility]. Next, tried her on ambrisentan, but she developed facial swelling on ambrisentan (significant around the eyes). She also developed worsening dyspnea. She stopped ambrisentan and the swelling resolved but she was noted to be volume overloaded. Swtched her Lasix to torsemide 40 mg bid.     She presents today for regular follow up.  Taking Revatio 20 mg tid. Her Cordie GriceUptravi is up to 1200 mcg bid now, increases every Saturday.  Is having near constant leg aches. Cordie GriceUptravi pharmacist called and asked her if she has ever tried neurontin. She tried some of her husbands and states this has helped. Breathing is "pretty" good.   Has been having near syncopal episodes on and off. Has SBPs from 140s down into 90s at home.  Doesn't really get SOB walking, just tired and feels like she is going to collapse. Has to take frequent breaks when walking.  Doing pulmonary rehab, husband has to walk with her.   Labs (9/16): K 4, creatinine 1.31, BNP 466 Labs (11/16): BNP 577 Labs (12/16); K 4, creatinine 1.3, normal rheumatoid factor and scleroderma serologies Labs (1/17): ANA positive but only 1:80 titer, dsDNA negative, SSA/B negative, CCP negative.  Labs (2/17): K 4.1, creatinine 1.48 => 1.42, BNP 487 Labs (3/17): K 4.2, creatinine 1.58, BNP 326  PMH: 1. Cardiolite (9/16) with EF 54%, no ischemia/infarction. 2. Chronic diastolic CHF: Echo (11/16) with EF 55-60%, normal RV size and systolic function, PA systolic pressure 66 mmHg.  3. Chronic atrial fibrillation: She has been intolerant of Multaq and propafenone.  Sotalol and flecainide have not been used due to CKD.   4. SBO: Managed conservatively. 5. CKD 6. OA: h/o THR and TKR.  7. H/o hyponatremia 8. Pneumonia with parapneumonic effusion with thoracenteses and eventual VATS and decortication.  9. Pulmonary hypertension: Suspect Group 1 pulmonary  hypertension.  RHC (12/16) with mean RA 5, PA 62/22 mean 40, mean PCWP 19, CI 2.46, PVR 5 WU.  PFTs (12/16) wiith FVC 71%, FEV1 73%, ratio 104%, TLC 71%, DLCO 53% => moderate restriction concerning for interstitial lung disease.  High resolution CT (12/16) did not show evidence for interstitial lung disease.  V/Q scan (12/16) did not show evidence for chronic PE.  Serologies: RF negative, scleroderma  antibodies negative, ANA positive but only 1:80 titer, SSA/B negative, dsDNA negative, CCP negative.  She did not tolerate macitentan or ambrisentan.  - 6 minute walk (12/16) with 244 meters - 6 minute walk (2/17) with 232 meters - 6 minute walk (3/17) with 414 meters - 6 minute walk (4/17) with 267 meters  SH: Married, lives in [place], nonsmoker, no ETOH.   FH: Brother with rheumatic fever.   ROS: All systems reviewed and negative except as per HPI.   Current Outpatient Prescriptions  Medication Sig Dispense Refill  . estrogens, conjugated, (PREMARIN) 0.3 MG tablet Take 0.3 mg by mouth daily. Take daily for 21 days then do not take for 7 days.    . metoprolol succinate (TOPROL-XL) 50 MG 24 hr tablet Take 1 tablet (50 mg total) by mouth daily. 90 tablet 3  . potassium chloride 20 MEQ TBCR Take 20 mEq by mouth 2 (two) times daily.    . Probiotic Product (PROBIOTIC & ACIDOPHILUS EX ST PO) Take 1 tablet by mouth daily.    . Rivaroxaban (XARELTO) 15 MG TABS tablet Take 1 tablet (15 mg total) by mouth daily with supper. 90 tablet 3  . Selexipag (UPTRAVI) 200 MCG TABS Take 400 mcg by mouth 2 (two) times daily.    . sildenafil (REVATIO) 20 MG tablet Take 1 tablet (20 mg total) by mouth 3 (three) times daily. 90 tablet 0  . torsemide (DEMADEX) 20 MG tablet Take 2 tablets (40 mg total) by mouth 2 (two) times daily. 120 tablet 3   No current facility-administered medications for this encounter.   BP 140/68 mmHg  Pulse 80  Wt 141 lb 12 oz (64.297 kg)  SpO2 92%  Negative orthostatics.  General: NAD Neck: JVP 6-7 cm, no thyromegaly or thyroid nodule.  Lungs: Clear to auscultation bilaterally with normal respiratory effort. CV: Nondisplaced PMI.  Heart irregular S1/S2, no S3/S4.  1/6 HSM LLSB.  No edema.  No carotid bruit.  Normal pedal pulses.  Abdomen: Soft,NT, ND, no HSM. No bruits or masses. +BS  Skin: Intact without lesions or rashes.  Neurologic: Alert and oriented x 3.  Psych:  Normal affect. Extremities: No clubbing or cyanosis.  HEENT: Normal.   Assessment/Plan:  1. Chronic diastolic CHF:  NYHA class II-III symptoms.  Volume status stable on exam.  - Continue current torsemide.  BMET today.  2. Pulmonary hypertension: Moderate to severe pulmonary hypertension by echo (PASP 66 mmHg). RHC showed moderate pulmonary arterial hypertension with PVR 5 WU.  PFTs with restrictive changes suggestive of interstitial process but high resolution CT did not show interstitial lung disease.  No evidence for chronic PE on V/Q scan.  Rheumatologic serologies all negative.  Possible group 1 pulmonary hypertension.  She was unable to tolerate macitentan or ambrisentan.  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  She is now on Revatio 20 mg tid and has started selexipag, currently up to 1200 mcg bid.  6 minute walk is worse today but may be due to leg pain on selexipag.  - Continue Revatio 20 mg tid.   -  She has tolerated selexipag so far though has had trouble with leg pain. Continue to increase dose gradually.   - Continue pulmonary rehab in [place].  3. Pulmonary: s/p PNA with parapneumonic effusion and eventual VATS with decortication.  As above, restrictive PFTs but no ILD on high resolution CT.  4. Atrial fibrillation: Chronic, apparently > 1 year.  Will need to consider cardioversion on Tikosyn.  Will see how well she can be compensated with diuresis and treatment of pulmonary hypertension, and if she continues to have CHF symptoms/signs, would consider attempting DCCV with Tikosyn to maintain NSR.  - Continue Xarelto and Toprol XL.  5. HTN: BP has been lower recently, some lightheadedness with standing. Continue to wear graded compression stockings. Decrease Toprol XL to 25 mg daily.   Graciella Freer, PA-C 05/09/2015   Patient seen with PA, agree with the above note.  Selexipag seems to have helped dyspnea, but higher doses are causing leg pain; this is the likely reason for  worsened 6 minute walk.  She tried her husband's gabapentin and this helped.   - Add gabapentin 300 mg tid.   She has occasional orthostatic-type lightheaded episodes. I will have her cut Toprol XL back to 25 mg daily.   Continue current torsemide, BMET today.   Followup in 2 months.   [name] 05/11/2015

## 2015-05-09 NOTE — Patient Instructions (Signed)
Decrease Metoprolol to 25 mg daily  Start Gabapentin 300 mg Three times a day   Labs today  We will contact you in 2 months to schedule your next appointment.

## 2015-05-12 ENCOUNTER — Telehealth (HOSPITAL_COMMUNITY): Payer: Self-pay | Admitting: Cardiology

## 2015-05-12 DIAGNOSIS — I272 Pulmonary hypertension, unspecified: Secondary | ICD-10-CM

## 2015-05-12 MED ORDER — TORSEMIDE 20 MG PO TABS: 20.0000 mg | ORAL_TABLET | Freq: Two times a day (BID) | ORAL | Status: DC

## 2015-05-12 NOTE — Telephone Encounter (Signed)
Patient aware and voiced understanding- repeat labs 5/15

## 2015-05-12 NOTE — Telephone Encounter (Signed)
-----   Message from Laurey Moralealton S McLean, MD sent at 05/09/2015  4:58 PM EDT ----- Creatinine higher, decrease torsemide to 40 qam/20 qpm and repeat BMET 1 week.

## 2015-05-19 ENCOUNTER — Ambulatory Visit (HOSPITAL_COMMUNITY)
Admission: RE | Admit: 2015-05-19 | Discharge: 2015-05-19 | Disposition: A | Discharge status: Home / Self Care | Payer: Medicare Other | Source: Ambulatory Visit | Attending: Internal Medicine | Admitting: Internal Medicine

## 2015-05-19 DIAGNOSIS — I272 Other secondary pulmonary hypertension: Secondary | ICD-10-CM | POA: Diagnosis not present

## 2015-06-13 ENCOUNTER — Telehealth (HOSPITAL_COMMUNITY): Payer: Self-pay | Admitting: Surgery

## 2015-06-13 NOTE — Telephone Encounter (Signed)
Received a call from [facility] regarding ongoing leg pain that Rachael Myers is experiencing.  I called back and left message that Rachael Myers should contact her PCP about the leg pain.

## 2015-07-07 ENCOUNTER — Emergency Department (HOSPITAL_COMMUNITY): Payer: Medicare Other

## 2015-07-07 ENCOUNTER — Emergency Department (HOSPITAL_COMMUNITY)
Admission: EM | Admit: 2015-07-07 | Discharge: 2015-07-07 | Disposition: A | Discharge status: Home / Self Care | Payer: Medicare Other | Attending: Emergency Medicine | Admitting: Emergency Medicine

## 2015-07-07 ENCOUNTER — Telehealth (HOSPITAL_COMMUNITY): Payer: Self-pay | Admitting: *Deleted

## 2015-07-07 ENCOUNTER — Encounter (HOSPITAL_COMMUNITY): Payer: Self-pay | Admitting: Emergency Medicine

## 2015-07-07 DIAGNOSIS — I2510 Atherosclerotic heart disease of native coronary artery without angina pectoris: Secondary | ICD-10-CM | POA: Diagnosis not present

## 2015-07-07 DIAGNOSIS — R0602 Shortness of breath: Secondary | ICD-10-CM | POA: Insufficient documentation

## 2015-07-07 DIAGNOSIS — I509 Heart failure, unspecified: Secondary | ICD-10-CM | POA: Insufficient documentation

## 2015-07-07 DIAGNOSIS — I110 Hypertensive heart disease with heart failure: Secondary | ICD-10-CM | POA: Diagnosis not present

## 2015-07-07 HISTORY — DX: Essential (primary) hypertension: I10

## 2015-07-07 HISTORY — DX: Heart failure, unspecified: I50.9

## 2015-07-07 HISTORY — DX: Atherosclerotic heart disease of native coronary artery without angina pectoris: I25.10

## 2015-07-07 LAB — CBC
HEMATOCRIT: 34.9 % — AB (ref 36.0–46.0)
Hemoglobin: 10.8 g/dL — ABNORMAL LOW (ref 12.0–15.0)
MCH: 28.4 pg (ref 26.0–34.0)
MCHC: 30.9 g/dL (ref 30.0–36.0)
MCV: 91.8 fL (ref 78.0–100.0)
Platelets: 232 10*3/uL (ref 150–400)
RBC: 3.8 MIL/uL — ABNORMAL LOW (ref 3.87–5.11)
RDW: 14.1 % (ref 11.5–15.5)
WBC: 5.9 10*3/uL (ref 4.0–10.5)

## 2015-07-07 LAB — BASIC METABOLIC PANEL
Anion gap: 8 (ref 5–15)
BUN: 31 mg/dL — AB (ref 6–20)
CHLORIDE: 103 mmol/L (ref 101–111)
CO2: 27 mmol/L (ref 22–32)
CREATININE: 1.77 mg/dL — AB (ref 0.44–1.00)
Calcium: 9 mg/dL (ref 8.9–10.3)
GFR calc non Af Amer: 25 mL/min — ABNORMAL LOW (ref >60)
GFR, EST AFRICAN AMERICAN: 29 mL/min — AB (ref >60)
Glucose, Bld: 91 mg/dL (ref 65–99)
Potassium: 3.4 mmol/L — ABNORMAL LOW (ref 3.5–5.1)
SODIUM: 138 mmol/L (ref 135–145)

## 2015-07-07 LAB — I-STAT TROPONIN, ED: Troponin i, poc: 0 ng/mL (ref 0.00–0.08)

## 2015-07-07 LAB — BRAIN NATRIURETIC PEPTIDE: B NATRIURETIC PEPTIDE 5: 398.3 pg/mL — AB (ref 0.0–100.0)

## 2015-07-07 NOTE — ED Notes (Signed)
Pt stable, ambulatory, states understanding of discharge instructions 

## 2015-07-07 NOTE — ED Notes (Signed)
Gave pt a [place] sandwich and decaf coffee per Brooke-RN.

## 2015-07-07 NOTE — Discharge Instructions (Signed)

## 2015-07-07 NOTE — Telephone Encounter (Signed)
Per Tonye BecketAmy Clegg, NP pt should go to ER for further eval and treatment, pt and husband aware and agreeable

## 2015-07-07 NOTE — ED Provider Notes (Signed)
CSN: [id number]     Arrival date & time 07/07/15  1123 History   First MD Initiated Contact with Patient 07/07/15 1149     Chief Complaint  Patient presents with  . Chest Pain  . Weakness  . Leg Swelling     (Consider location/radiation/quality/duration/timing/severity/associated sxs/prior Treatment) HPI   Rachael Myers is a 82 y.o. female who presents for evaluation of shortness of breath, worse with exertion, on and off for 2 weeks. She has intermittent chronic shortness of breath, and has been diagnosed with pulmonary hypertension. She is also having intermittent chest pain. She is being followed closely by cardiology. No recent changes in her medicine treatment regimen. She was concerned that she needed to have some fluid taken off her lungs, so came to the emergency department. 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 She denies fever, chills, cough, persistent chest pain, nausea, vomiting, weakness or dizziness. There are no other known modifying factors.   Past Medical History  Diagnosis Date  . Hypertension   . CHF (congestive heart failure) (HCC)   . Coronary artery disease    History reviewed. No pertinent past surgical history. No family history on file. Social History  Substance Use Topics  . Smoking status: Never Smoker   . Smokeless tobacco: None  . Alcohol Use: No   OB History    No data available     Review of Systems  All other systems reviewed and are negative.     Allergies  Tylenol  Home Medications   Prior to Admission medications   Not on File   BP 129/60 mmHg  Pulse 47  Temp(Src) 97.9 F (36.6 C)  Resp 17  Ht 5\' 7"  (1.702 m)  Wt 145 lb (65.772 kg)  BMI 22.71 kg/m2  SpO2 94% Physical Exam  Constitutional: She is oriented to person, place, and time. She appears well-developed and well-nourished.  HENT:  Head: Normocephalic and atraumatic.  Right Ear: External ear normal.   Left Ear: External ear normal.  Eyes: Conjunctivae and EOM are normal. Pupils are equal, round, and reactive to light.  Neck: Normal range of motion and phonation normal. Neck supple.  Cardiovascular: Normal rate, regular rhythm and normal heart sounds.   Pulmonary/Chest: Effort normal and breath sounds normal. She exhibits no bony tenderness.  Abdominal: Soft. There is no tenderness.  Musculoskeletal: Normal range of motion.  Neurological: She is alert and oriented to person, place, and time. No cranial nerve deficit or sensory deficit. She exhibits normal muscle tone. Coordination normal.  Skin: Skin is warm, dry and intact.  Psychiatric: She has a normal mood and affect. Her behavior is normal. Judgment and thought content normal.  Nursing note and vitals reviewed.   ED Course  Procedures (including critical care time)   Medications - No data to display  Patient Vitals for the past 24 hrs:  BP Temp Pulse Resp SpO2 Height Weight  07/07/15 1530 129/60 mmHg - (!) 47 17 94 % - -  07/07/15 1515 106/77 mmHg - 73 18 98 % - -  07/07/15 1500 (!) 115/48 mmHg - 65 14 99 % - -  07/07/15 1445 105/85 mmHg - 72 13 100 % - -  07/07/15 1435 134/73 mmHg - 76 19 97 % - -  07/07/15 1415 151/70 mmHg - - 15 95 % - -  07/07/15 1330 157/89 mmHg - 60 11 97 % - -  07/07/15 1315 157/75 mmHg - 61 19 - - -  07/07/15 1307 151/76 mmHg - 68 17 97 % - -  07/07/15 1215 150/85 mmHg - 61 13 96 % - -  07/07/15 1200 163/75 mmHg - 88 - 99 % - -  07/07/15 1138 156/79 mmHg 97.9 F (36.6 C) 71 16 98 % 5\' 7"  (1.702 m) 145 lb (65.772 kg)    At discharge- Reevaluation with update and discussion. After initial assessment and treatment, an updated evaluation reveals she is ambulatory, in no apparent distress, findings discussed with patient and husband, all questions answered. Nedim Oki L     Labs Review Labs Reviewed  BASIC METABOLIC PANEL - Abnormal; Notable for the following:    Potassium 3.4 (*)    BUN 31  (*)    Creatinine, Ser 1.77 (*)    GFR calc non Af Amer 25 (*)    GFR calc Af Amer 29 (*)    All other components within normal limits  CBC - Abnormal; Notable for the following:    RBC 3.80 (*)    Hemoglobin 10.8 (*)    HCT 34.9 (*)    All other components within normal limits  BRAIN NATRIURETIC PEPTIDE - Abnormal; Notable for the following:    B Natriuretic Peptide 398.3 (*)    All other components within normal limits  I-STAT TROPOININ, ED    Imaging Review Dg Chest 2 View  07/07/2015  CLINICAL DATA:  Worsening episodes of shortness of breath and peripheral edema for the past several months sense lung surgery; history of CHF and coronary artery disease EXAM: CHEST  2 VIEW COMPARISON:  None in PACs FINDINGS: The lungs are mildly hyperinflated. There is a small right pleural effusion. There is hemidiaphragm flattening. The interstitial markings of both lungs are increased. There is a coarse 1.5 cm calcification in the right lower lobe laterally. The cardiac silhouette is normal in size. The pulmonary vascularity is not engorged. The bony thorax exhibits no acute abnormality. There are surgical clips in the right axillary region. IMPRESSION: COPD and pulmonary fibrotic change. Superimposed interstitial pneumonia could be present. Pulmonary edema is felt less likely. There is a small right pleural effusion of uncertain duration. Previous granulomatous infection, stable. Electronically Signed   By: David  SwazilandJordan M.D.   On: 07/07/2015 12:44   I have personally reviewed and evaluated these images and lab results as part of my medical decision-making.   EKG Interpretation   Date/Time:  Monday July 07 2015 11:30:36 EDT Ventricular Rate:  69 PR Interval:    QRS Duration: 88 QT Interval:  438 QTC Calculation: 469 R Axis:   -48 Text Interpretation:  Atrial fibrillation Left axis deviation Anteroseptal  infarct , age undetermined Abnormal ECG No old tracing to compare  Confirmed by [facility]  MD,  S[facility] Demuro [phone]([id number]) on 07/07/2015 11:50:22 AM      MDM   Final diagnoses:  Shortness of breath    Nonspecific dyspnea, with reassuring evaluation. No indication for thoracocentesis, aggressive diuresis, treatment with antibiotics or inpatient observation.  Nursing Notes Reviewed/ Care Coordinated Applicable Imaging Reviewed Interpretation of Laboratory Data incorporated into ED treatment  The patient appears reasonably screened and/or stabilized for discharge and I doubt any other medical condition or other [facility] requiring further screening, evaluation, or treatment in the ED at this time prior to discharge.  Plan: Home Medications- usual; Home Treatments- rest; return here if the recommended treatment, does not improve the symptoms; Recommended follow up- PCP prn  Mancel Bale, MD 07/07/15 [phone]

## 2015-07-07 NOTE — ED Notes (Signed)
Pt. Stated, I have pulmonary hypertension of the lung.  I've been having chest pain with SOB and leg swelling

## 2015-07-07 NOTE — Telephone Encounter (Signed)
Pt called very concerned about SOB.  She states her wt was up to 145 lb today, she states it was running 135-138 lb and has gradually been increasing for past few weeks.  She states her abd is tight and swollen as well as her legs.  She states she was SOB laying down last night awoke almost panting to breath.  After a few minutes of talking she became SOB on the phone.  She states she has been taking Torsemide 40 mg in AM and 20 mg in PM but doesn't think she has been urinating that much.  Will discuss w/provider and call her back.

## 2015-07-09 ENCOUNTER — Encounter (HOSPITAL_COMMUNITY): Payer: Self-pay

## 2015-07-22 ENCOUNTER — Inpatient Hospital Stay (HOSPITAL_COMMUNITY)
Admission: AD | Admit: 2015-07-22 | Discharge: 2015-07-23 | DRG: 312 | Disposition: A | Discharge status: Home / Self Care | Payer: Medicare Other | Source: Other Acute Inpatient Hospital | Attending: Cardiovascular Disease | Admitting: Cardiovascular Disease

## 2015-07-22 DIAGNOSIS — Z96649 Presence of unspecified artificial hip joint: Secondary | ICD-10-CM | POA: Diagnosis present

## 2015-07-22 DIAGNOSIS — I2510 Atherosclerotic heart disease of native coronary artery without angina pectoris: Secondary | ICD-10-CM | POA: Diagnosis present

## 2015-07-22 DIAGNOSIS — I5032 Chronic diastolic (congestive) heart failure: Secondary | ICD-10-CM | POA: Diagnosis present

## 2015-07-22 DIAGNOSIS — I351 Nonrheumatic aortic (valve) insufficiency: Secondary | ICD-10-CM | POA: Diagnosis present

## 2015-07-22 DIAGNOSIS — I272 Other secondary pulmonary hypertension: Secondary | ICD-10-CM

## 2015-07-22 DIAGNOSIS — I482 Chronic atrial fibrillation, unspecified: Secondary | ICD-10-CM | POA: Diagnosis present

## 2015-07-22 DIAGNOSIS — E876 Hypokalemia: Secondary | ICD-10-CM | POA: Diagnosis present

## 2015-07-22 DIAGNOSIS — R55 Syncope and collapse: Secondary | ICD-10-CM | POA: Diagnosis not present

## 2015-07-22 DIAGNOSIS — I509 Heart failure, unspecified: Secondary | ICD-10-CM

## 2015-07-22 DIAGNOSIS — I951 Orthostatic hypotension: Secondary | ICD-10-CM | POA: Diagnosis present

## 2015-07-22 DIAGNOSIS — N189 Chronic kidney disease, unspecified: Secondary | ICD-10-CM | POA: Diagnosis present

## 2015-07-22 DIAGNOSIS — N184 Chronic kidney disease, stage 4 (severe): Secondary | ICD-10-CM | POA: Diagnosis present

## 2015-07-22 DIAGNOSIS — I130 Hypertensive heart and chronic kidney disease with heart failure and stage 1 through stage 4 chronic kidney disease, or unspecified chronic kidney disease: Secondary | ICD-10-CM | POA: Diagnosis present

## 2015-07-22 DIAGNOSIS — M1990 Unspecified osteoarthritis, unspecified site: Secondary | ICD-10-CM | POA: Diagnosis present

## 2015-07-22 DIAGNOSIS — Z96659 Presence of unspecified artificial knee joint: Secondary | ICD-10-CM | POA: Diagnosis present

## 2015-07-22 DIAGNOSIS — R0602 Shortness of breath: Secondary | ICD-10-CM | POA: Diagnosis present

## 2015-07-22 DIAGNOSIS — I10 Essential (primary) hypertension: Secondary | ICD-10-CM | POA: Diagnosis present

## 2015-07-22 LAB — BASIC METABOLIC PANEL
ANION GAP: 11 (ref 5–15)
BUN: 32 mg/dL — AB (ref 6–20)
CHLORIDE: 102 mmol/L (ref 101–111)
CO2: 26 mmol/L (ref 22–32)
Calcium: 8.9 mg/dL (ref 8.9–10.3)
Creatinine, Ser: 1.7 mg/dL — ABNORMAL HIGH (ref 0.44–1.00)
GFR calc Af Amer: 31 mL/min — ABNORMAL LOW (ref >60)
GFR, EST NON AFRICAN AMERICAN: 26 mL/min — AB (ref >60)
GLUCOSE: 204 mg/dL — AB (ref 65–99)
POTASSIUM: 3 mmol/L — AB (ref 3.5–5.1)
Sodium: 139 mmol/L (ref 135–145)

## 2015-07-22 MED ORDER — ESTROGENS CONJUGATED 0.3 MG PO TABS
0.3000 mg | ORAL_TABLET | Freq: Every day | ORAL | Status: DC
  Administered 2015-07-23: 0.3 mg via ORAL
  Filled 2015-07-22: qty 1

## 2015-07-22 MED ORDER — POTASSIUM CHLORIDE 10 MEQ/100ML IV SOLN
10.0000 meq | INTRAVENOUS | Status: AC
  Administered 2015-07-23 (×3): 10 meq via INTRAVENOUS
  Filled 2015-07-22 (×3): qty 100

## 2015-07-22 MED ORDER — GABAPENTIN 300 MG PO CAPS
300.0000 mg | ORAL_CAPSULE | Freq: Three times a day (TID) | ORAL | Status: DC
  Administered 2015-07-22 – 2015-07-23 (×3): 300 mg via ORAL
  Filled 2015-07-22 (×3): qty 1

## 2015-07-22 MED ORDER — POTASSIUM CHLORIDE ER 10 MEQ PO TBCR
30.0000 meq | EXTENDED_RELEASE_TABLET | Freq: Every day | ORAL | Status: DC
  Administered 2015-07-23: 30 meq via ORAL
  Filled 2015-07-22 (×2): qty 3

## 2015-07-22 MED ORDER — TORSEMIDE 20 MG PO TABS: 20.0000 mg | ORAL_TABLET | Freq: Every day | ORAL | Status: DC

## 2015-07-22 MED ORDER — RIVAROXABAN 15 MG PO TABS: 15.0000 mg | ORAL_TABLET | Freq: Every day | ORAL | Status: DC

## 2015-07-22 MED ORDER — SELEXIPAG 200 MCG PO TABS
400.0000 ug | ORAL_TABLET | Freq: Two times a day (BID) | ORAL | Status: DC
  Administered 2015-07-23: 400 ug via ORAL

## 2015-07-22 MED ORDER — ONDANSETRON HCL 4 MG/2ML IJ SOLN: 4.0000 mg | Freq: Four times a day (QID) | INTRAMUSCULAR | Status: DC | PRN

## 2015-07-22 MED ORDER — METOPROLOL SUCCINATE ER 25 MG PO TB24
25.0000 mg | ORAL_TABLET | Freq: Every day | ORAL | Status: DC
  Administered 2015-07-23: 25 mg via ORAL
  Filled 2015-07-22: qty 1

## 2015-07-22 MED ORDER — TORSEMIDE 20 MG PO TABS
40.0000 mg | ORAL_TABLET | Freq: Every day | ORAL | Status: DC
  Administered 2015-07-23: 40 mg via ORAL
  Filled 2015-07-22 (×2): qty 2

## 2015-07-22 MED ORDER — TORSEMIDE 20 MG PO TABS: 20.0000 mg | ORAL_TABLET | Freq: Two times a day (BID) | ORAL | Status: DC

## 2015-07-22 MED ORDER — ACETAMINOPHEN 325 MG PO TABS: 650.0000 mg | ORAL_TABLET | ORAL | Status: DC | PRN

## 2015-07-22 MED ORDER — SILDENAFIL CITRATE 20 MG PO TABS
20.0000 mg | ORAL_TABLET | Freq: Three times a day (TID) | ORAL | Status: DC
  Administered 2015-07-22 – 2015-07-23 (×3): 20 mg via ORAL
  Filled 2015-07-22 (×3): qty 1

## 2015-07-22 NOTE — H&P (Signed)
Patient ID: Vayla Wilhelmi MRN: [id number], DOB/AGE: 1929/04/19   Admit date: 07/22/2015  Requesting Physician: Primary Physician: Dan Maker, MD Primary Cardiologist: Shirlee Latch Reason for admission: Syncope  Pt. Profile:  Rachael Myers is a 82 y.o. female with a PMH significant for HTN, HFpEF, PAH on Selexipag and Sildenafil, chronic atrial fibrillation previously failed dronedarone and propafenone and now anti-coagulated with Rivaroxaban, and recurrent presyncopal episodes who presented to an OSH after a brief syncopal event with resultant hand and knee lacerations.  The pt has been in her normal state of health over recent days, with her chronic SOB at baseline and maintaining strict adherence to her prescribed medication regimen.  This evening she exited her car at [employer] and walked towards the door when she felt as though she may pass out.  Rachael Myers tried to make it to a shopping cart to steady herself but briefly lost consciousness before getting there, falling to the ground on her right side, but not striking her head.  She quickly regained consciousness, and notes that the feeling of "light-headedness and weakness" that preceded her event occurs as often as 1-2 times per week.   She does not experience chest pain, diaphoresis, palpitations, acute SOB, or the sensation of a racing heart during these events nor today.  She has only lost consciousness on one other occasion that she is aware of, occuring last year and resulting in a head laceration.    On arrival to the OSH she was noted to be afebrile and hemodynamically stable.  Limited records accompanied Rachael Myers from that hospital, but notably her HR was in the 80s and BP 140-160/85-130mmHg.  There is not an ECG available for review.  Labs are notable for K=3.1 and Cr=1.8.  Her hand and leg lacerations were sutured, and she is only experiencing mild pain at the sites currently. She is now resting comfortably in bed and has no  acute complaints.   Problem List  Past Medical History  Diagnosis Date  . Osteoarthritis   . Hyponatremia   . History of small bowel obstruction     Managed conservatively  . History of nuclear stress test     Myoview 9/16:  EF 54%, normal study  . History of echocardiogram     Echo 11/16:  EF 55-60%, no RWMA, MAC, mild LAE, normal RVF, mild RAE, PASP 66 mmHg, trivial eff  . History of lung biopsy     [facility] 01/2014  . Hypertension   . CHF (congestive heart failure) (HCC)   . Coronary artery disease     Past Surgical History  Procedure Laterality Date  . Vesicovaginal fistula closure w/ tah    . Rectocele repair    . Total knee arthroplasty    . Total hip arthroplasty    . Cardiac catheterization N/A 12/17/2014    Procedure: Right Heart Cath;  Surgeon: Laurey Morale, MD;  Location: [facility] INVASIVE CV LAB;  Service: Cardiovascular;  Laterality: N/A;     Allergies  Allergies  Allergen Reactions  . Acetaminophen Hives, Swelling and Rash    Lip swelling  . Letairis [Ambrisentan] Swelling    Significant facial/eye swelling  . Ace Inhibitors     unknown  . Atenolol     unknown  . Diltiazem Hcl     unknown  . Hctz [Hydrochlorothiazide]     unknown  . Hydrochloric Acid Other (See Comments)    hyponatremia  . Hydrocodone-Acetaminophen Other (See Comments)    Hives  .  Opsumit [Macitentan]     Mouth sores  . Oxycodone     unknown  . Tylenol [Acetaminophen] Swelling and Rash     Home Medications  Prior to Admission medications   Medication Sig Start Date End Date Taking? Authorizing Provider  estrogens, conjugated, (PREMARIN) 0.3 MG tablet Take 0.3 mg by mouth daily. Take daily for 21 days then do not take for 7 days.    Historical Provider, MD  gabapentin (NEURONTIN) 300 MG capsule Take 1 capsule (300 mg total) by mouth 3 (three) times daily. 05/09/15   Laurey Moralealton S McLean, MD  metoprolol succinate (TOPROL-XL) 25 MG 24 hr tablet Take 1 tablet (25 mg total) by mouth daily.  05/09/15   Laurey Moralealton S McLean, MD  Potassium Chloride ER 20 MEQ TBCR Take 20 mEq by mouth 2 (two) times daily. 05/09/15   Laurey Moralealton S McLean, MD  Probiotic Product (PROBIOTIC & ACIDOPHILUS EX ST PO) Take 1 tablet by mouth daily.    Historical Provider, MD  Rivaroxaban (XARELTO) 15 MG TABS tablet Take 1 tablet (15 mg total) by mouth daily with supper. 05/09/15   Laurey Moralealton S McLean, MD  Selexipag (UPTRAVI) 200 MCG TABS Take 400 mcg by mouth 2 (two) times daily.    Historical Provider, MD  sildenafil (REVATIO) 20 MG tablet Take 1 tablet (20 mg total) by mouth 3 (three) times daily. 02/12/15   Laurey Moralealton S McLean, MD  torsemide (DEMADEX) 20 MG tablet Take 1-2 tablets (20-40 mg total) by mouth 2 (two) times daily. Two in the AM and one in the PM 05/12/15   Laurey Moralealton S McLean, MD    Family History  Family History  Problem Relation Age of Onset  . Cancer Mother    Family Status  Relation Status Death Age  . Mother Deceased   . Father Deceased   . Sister Alive   . Brother Deceased   . Sister Alive   . Sister Alive   . Sister Alive   . Sister Alive   . Sister Alive   . Sister Alive   . Brother Deceased   . Brother Deceased   . Brother Deceased   . Brother Deceased   . Maternal Grandmother Deceased   . Maternal Grandfather Deceased   . Paternal Grandmother Deceased   . Paternal Grandfather Deceased      Social History  Social History   Social History  . Marital Status: Married    Spouse Name: N/A  . Number of Children: N/A  . Years of Education: N/A   Occupational History  . retired    Social History Main Topics  . Smoking status: Never Smoker   . Smokeless tobacco: Not on file  . Alcohol Use: No  . Drug Use: No  . Sexual Activity: Not on file   Other Topics Concern  . Not on file   Social History Narrative   ** Merged History Encounter **       The patient is married. She is retired from work. She has no history of tobacco or alcohol use.     Review of Systems General:  No chills,  fever, night sweats or weight changes.  Cardiovascular:  No chest pain, dyspnea on exertion, edema, orthopnea, palpitations, paroxysmal nocturnal dyspnea. Dermatological: No rash, lesions/masses Respiratory: No cough, dyspnea Urologic: No hematuria, dysuria Abdominal:   No nausea, vomiting, diarrhea, bright red blood per rectum, melena, or hematemesis Neurologic:  No visual changes, wkns, changes in mental status. All other systems reviewed and  are otherwise negative except as noted above.  Physical Exam  Blood pressure 175/76, pulse 81, temperature 97.8 F (36.6 C), temperature source Oral, resp. rate 18, height 5\' 7"  (1.702 m), weight 68.04 kg (150 lb), SpO2 100 %.  General: Pleasant, NAD Psych: Normal affect. Neuro: Alert and oriented X 3. Moves all extremities spontaneously. HEENT: NCAT, mmm, EOMI  Neck: Supple without bruits or JVD. Lungs:  CTAB, no w/r/c Heart: rate in the 80s, irregular rhythm, +S1 +S2, soft II/VI holosystolic murmur at the LLSB, no diastolic murmurs, trace LE edema to the ankle, no JVD. Abdomen: Soft, non-tender, non-distended, BS + x 4.  Extremities: fresh bandages over right hand and right lower extremity  Labs  No results for input(s): CKTOTAL, CKMB, TROPONINI in the last 72 hours. Lab Results  Component Value Date   WBC 5.9 07/07/2015   HGB 10.8* 07/07/2015   HCT 34.9* 07/07/2015   MCV 91.8 07/07/2015   PLT 232 07/07/2015   No results for input(s): NA, K, CL, CO2, BUN, CREATININE, CALCIUM, PROT, BILITOT, ALKPHOS, ALT, AST, GLUCOSE in the last 168 hours.  Invalid input(s): LABALBU Lab Results  Component Value Date   CHOL 176 08/09/2012   HDL 68.00 08/09/2012   LDLCALC 88 08/09/2012   TRIG 102.0 08/09/2012   No results found for: DDIMER   Radiology/Studies  Dg Chest 2 View  07/07/2015  CLINICAL DATA:  Worsening episodes of shortness of breath and peripheral edema for the past several months sense lung surgery; history of CHF and coronary  artery disease EXAM: CHEST  2 VIEW COMPARISON:  None in PACs FINDINGS: The lungs are mildly hyperinflated. There is a small right pleural effusion. There is hemidiaphragm flattening. The interstitial markings of both lungs are increased. There is a coarse 1.5 cm calcification in the right lower lobe laterally. The cardiac silhouette is normal in size. The pulmonary vascularity is not engorged. The bony thorax exhibits no acute abnormality. There are surgical clips in the right axillary region. IMPRESSION: COPD and pulmonary fibrotic change. Superimposed interstitial pneumonia could be present. Pulmonary edema is felt less likely. There is a small right pleural effusion of uncertain duration. Previous granulomatous infection, stable. Electronically Signed   By: David  Swaz[place]and M.D.   On: 07/07/2015 12:44    ECG: per my interpretation, atrial fibrillation with ventricular rate near 70bpm, no pathologic Qwaves or ST/Twave changes indicative of ischemia, delayed R-S transition   ASSESSMENT AND PLAN  Rachael Myers is a 82 y.o. female with a PMH significant for HTN, HFpEF, PAH, chronic atrial fibrillation anti-coagulated with Rivaroxaban, and recurrent presyncopal episodes who presented to an OSH after a brief syncopal event with resultant hand and knee lacerations.  Her recurrent presyncopal events and today's syncopal event seem most consistent with intermittent orthostatic hypotension, and the pt has been noted to have labile BP in the outpatient setting.  The preceding prodrome makes an acute arrhythmic etiology less likely, and her PAH has been well-managed with Sildenafil and increasing doses of Selexipag.  1) Syncope; likely orthostatic hypotension as per above - formal RN orthostatic evaluation in the am - continue beta-blocker and pulmonary vasodilators as per below  - continuous telemetry - consider repeat TTE in am to re-evaluate RV function in the setting of PAH - stable neurologic function, no  focal deficits, defer head CT  2) PAH; chronic SOB stable - Sildenafil 20mg  PO TID - Selexipag 1200mg  PO BID per Dr. Alford Highland most recent note and confirmed with pt  3) Chronic  HFpEF; euvolemic on exam, stable - Metoprolol succinate 25mg  PO QDAY - Torsemide 40mg  PO BID  4) Chronic Afib; stable, ventricular rate appropriately controlled - metoprolol as per above - Rivaroxaban 15mg  PO QDAY  5) CKD; Creatine appears to be at baseline, normal UOP over recent days  6) Hypokalemia; repeat BMP now as unclear if K repleted at OSH - maintain K>4   Signed, Azalee Course, MD 07/22/2015, 10:25 PM

## 2015-07-23 ENCOUNTER — Inpatient Hospital Stay (HOSPITAL_COMMUNITY): Payer: Medicare Other

## 2015-07-23 ENCOUNTER — Encounter (HOSPITAL_COMMUNITY): Payer: Self-pay

## 2015-07-23 ENCOUNTER — Encounter (HOSPITAL_COMMUNITY): Payer: Medicare Other

## 2015-07-23 DIAGNOSIS — I272 Other secondary pulmonary hypertension: Secondary | ICD-10-CM | POA: Diagnosis not present

## 2015-07-23 DIAGNOSIS — R55 Syncope and collapse: Secondary | ICD-10-CM

## 2015-07-23 DIAGNOSIS — I951 Orthostatic hypotension: Secondary | ICD-10-CM | POA: Diagnosis not present

## 2015-07-23 DIAGNOSIS — I5032 Chronic diastolic (congestive) heart failure: Secondary | ICD-10-CM

## 2015-07-23 DIAGNOSIS — I482 Chronic atrial fibrillation: Secondary | ICD-10-CM | POA: Diagnosis not present

## 2015-07-23 DIAGNOSIS — R0600 Dyspnea, unspecified: Secondary | ICD-10-CM

## 2015-07-23 LAB — BASIC METABOLIC PANEL
ANION GAP: 9 (ref 5–15)
BUN: 29 mg/dL — AB (ref 6–20)
CHLORIDE: 104 mmol/L (ref 101–111)
CO2: 27 mmol/L (ref 22–32)
Calcium: 9.1 mg/dL (ref 8.9–10.3)
Creatinine, Ser: 1.61 mg/dL — ABNORMAL HIGH (ref 0.44–1.00)
GFR calc Af Amer: 33 mL/min — ABNORMAL LOW (ref >60)
GFR calc non Af Amer: 28 mL/min — ABNORMAL LOW (ref >60)
GLUCOSE: 79 mg/dL (ref 65–99)
POTASSIUM: 3.3 mmol/L — AB (ref 3.5–5.1)
Sodium: 140 mmol/L (ref 135–145)

## 2015-07-23 LAB — CBC
HEMATOCRIT: 33.1 % — AB (ref 36.0–46.0)
HEMOGLOBIN: 10.7 g/dL — AB (ref 12.0–15.0)
MCH: 28.8 pg (ref 26.0–34.0)
MCHC: 32.3 g/dL (ref 30.0–36.0)
MCV: 89.2 fL (ref 78.0–100.0)
Platelets: 227 10*3/uL (ref 150–400)
RBC: 3.71 MIL/uL — ABNORMAL LOW (ref 3.87–5.11)
RDW: 14.4 % (ref 11.5–15.5)
WBC: 7.4 10*3/uL (ref 4.0–10.5)

## 2015-07-23 LAB — ECHOCARDIOGRAM COMPLETE
Height: 67 in
Weight: 2328 oz

## 2015-07-23 LAB — PROTIME-INR
INR: 1.56 — AB (ref 0.00–1.49)
Prothrombin Time: 18.7 seconds — ABNORMAL HIGH (ref 11.6–15.2)

## 2015-07-23 NOTE — Discharge Summary (Signed)
Discharge Summary    Patient ID: Rachael Myers,  MRN: [id number], DOB/AGE: 80/29/1931 80 y.o.  Admit date: 07/22/2015 Discharge date: 07/23/2015  Primary Care Provider: Dan Maker Primary Cardiologist: Dr. Shirlee Latch  Discharge Diagnoses    Principal Problem:   Syncope Active Problems:   Pulmonary hypertension (HCC)   Mild aortic regurgitation   Chronic atrial fibrillation (HCC)   Chronic diastolic CHF (congestive heart failure) (HCC)   Benign essential HTN   Chronic kidney disease   Breath shortness   Allergies Allergies  Allergen Reactions  . Acetaminophen Hives, Swelling and Rash    Lip swelling  . Letairis [Ambrisentan] Swelling    Significant facial/eye swelling  . Ace Inhibitors     unknown  . Atenolol     unknown  . Diltiazem Hcl     unknown  . Hctz [Hydrochlorothiazide]     unknown  . Hydrochloric Acid Other (See Comments)    hyponatremia  . Hydrocodone-Acetaminophen Other (See Comments)    Hives  . Opsumit [Macitentan]     Mouth sores  . Oxycodone     unknown  . Tylenol [Acetaminophen] Swelling and Rash    Diagnostic Studies/Procedures    Echo 07/23/2015 LV EF: 60% - 65%  ------------------------------------------------------------------- Indications: Dyspnea 786.09.  ------------------------------------------------------------------- History: PMH: Syncope. Atrial fibrillation. Coronary artery disease. Congestive heart failure. Primary pulmonary hypertension.  ------------------------------------------------------------------- Study Conclusions  - Left ventricle: The cavity size was normal. Wall thickness was  normal. Systolic function was normal. The estimated ejection  fraction was in the range of 60% to 65%. Wall motion was normal;  there were no regional wall motion abnormalities. The study is  not technically sufficient to allow evaluation of LV diastolic  function. - Aortic valve: Sclerosis without  stenosis. There was mild  regurgitation. - Mitral valve: Posterior calcified annulus. Mildly thickened  leaflets . There was mild regurgitation. - Left atrium: The atrium was normal in size. - Right ventricle: The cavity size was mildly dilated. Systolic  function is reduced. - Right atrium: The atrium was mildly dilated. - Tricuspid valve: There was mild regurgitation. - Pulmonary arteries: PA peak pressure: 36 mm Hg (S). - Inferior vena cava: The vessel was normal in size. The  respirophasic diameter changes were in the normal range (>= 50%),  consistent with normal central venous pressure.  Impressions:  - Compared to a prior echo in 11/2014, the LVEF is higher at  60-65%, RVSP appears lower at 36 mmHg in this study. _____________   History of Present Illness     Rachael Myers is a 82 y.o. female with a PMH significant for HTN, HFpEF, PAH on Selexipag and Sildenafil, chronic atrial fibrillation previously failed dronedarone and propafenone and now anti-coagulated with Rivaroxaban, and recurrent presyncopal episodes who presented to an OSH after a brief syncopal event with resultant hand and knee lacerations. The pt has been in her normal state of health over recent days, with her chronic SOB at baseline and maintaining strict adherence to her prescribed medication regimen. This evening she exited her car at [employer] and walked towards the door when she felt as though she may pass out. Ms. Zarzycki tried to make it to a shopping cart to steady herself but briefly lost consciousness before getting there, falling to the ground on her right side, but not striking her head. She quickly regained consciousness, and notes that the feeling of "light-headedness and weakness" that preceded her event occurs as often as 1-2 times per week. She  does not experience chest pain, diaphoresis, palpitations, acute SOB, or the sensation of a racing heart during these events nor today. She has only  lost consciousness on one other occasion that she is aware of, occuring last year and resulting in a head laceration.   On arrival to the OSH she was noted to be afebrile and hemodynamically stable. Limited records accompanied Ms. Qu from that hospital, but notably her HR was in the 80s and BP 140-160/85-1100mmHg. There is not an ECG available for review. Labs are notable for K=3.1 and Cr=1.8. Her hand and leg lacerations were sutured, and she is only experiencing mild pain at the sites currently. She is now resting comfortably in bed and has no acute complaints.  Hospital Course     After she was transferred to [facility], she was admitted to cardiology service. Overnight, telemetry did not reveal any significant event that could have triggered syncope. She maintains chronic atrial fibrillation without prolonged pauses or ventricular ectopy. She was seen in the morning of 07/23/2015, she says she continued to have worsening shortness of breath despite medication for pulmonary arterial hypertension since last December. Her previous VQ scan was negative. Her previous PFTs showed restrictive pattern concerning for interstitial lung disease. In ordered to further evaluate, she had a high-resolution CT obtained on 12/25/2014 that showed mosaic attenuation throughout the lung likely due to pulmonary vascular process, although CTEPH cannot be excluded. No evidence of interstitial lung disease. She had a history of parapneumonic effusion that required 4 thoacentesis and VATS with decortication and wedge resection. She underwent right heart cath on 12/17/2014 which showed PA 62/22, mean wedge 19, CO 4.21, CI 2.46. PVR 5 WU.  I am not sure what is causing her worsening shortness of breath. After discussing with M.D., we have obtained a repeat echocardiogram on 07/23/2015 which showed EF 60-65%, mild AR/MR/TR, PA peak pressure 36 mmHg. When compared to the previous echocardiogram in 2016, her PA peak pressure has  significantly improved from a previous 66 mmHg. We have also checked her orthostatic vital sign which was negative.  At this point, no further cardiology workup is planned. We recommended her follow-up with Dr. Shirlee Latch as previously scheduled. I have also instructed her to check with her PCP within the week to follow-up on the laceration and the potential need for suture removal. We will change her dressing prior to discharge.   _____________  Discharge Vitals Blood pressure 131/54, pulse 68, temperature 98.2 F (36.8 C), temperature source Oral, resp. rate 16, height  (1.702 m), weight 145 lb 8 oz (65.998 kg), SpO2 94 %.  Filed Weights   07/22/15 2155 07/23/15 0506  Weight: 150 lb (68.04 kg) 145 lb 8 oz (65.998 kg)    Labs & Radiologic Studies     CBC  Recent Labs  07/23/15 0508  WBC 7.4  HGB 10.7*  HCT 33.1*  MCV 89.2  PLT 227   Basic Metabolic Panel  Recent Labs  07/22/15 2254 07/23/15 0508  NA 139 140  K 3.0* 3.3*  CL 102 104  CO2 26 27  GLUCOSE 204* 79  BUN 32* 29*  CREATININE 1.70* 1.61*  CALCIUM 8.9 9.1    Dg Chest 2 View  07/07/2015  CLINICAL DATA:  Worsening episodes of shortness of breath and peripheral edema for the past several months sense lung surgery; history of CHF and coronary artery disease EXAM: CHEST  2 VIEW COMPARISON:  None in PACs FINDINGS: The lungs are mildly hyperinflated. There is  a small right pleural effusion. There is hemidiaphragm flattening. The interstitial markings of both lungs are increased. There is a coarse 1.5 cm calcification in the right lower lobe laterally. The cardiac silhouette is normal in size. The pulmonary vascularity is not engorged. The bony thorax exhibits no acute abnormality. There are surgical clips in the right axillary region. IMPRESSION: COPD and pulmonary fibrotic change. Superimposed interstitial pneumonia could be present. Pulmonary edema is felt less likely. There is a small right pleural effusion of  uncertain duration. Previous granulomatous infection, stable. Electronically Signed   By: David  SwazilandJordan M.D.   On: 07/07/2015 12:44    Disposition   Pt is being discharged home today in good condition.  Follow-up Plans & Appointments    Follow-up Information    Follow up with [facility] On 07/25/2015.   Specialty:  Cardiology   Why:  11:00AM. Heart failure clinic visit.    Contact information:   [address] [id number] [place] [id number] [phone]      Follow up with Dan MakerR,RICHARD L, MD.   Specialty:  Internal Medicine   Why:  followup with your primary care provider within 1 week to check the laceration wound   Contact information:   [address] [place] [id number] [phone]      Discharge Instructions    Diet - low sodium heart healthy    Complete by:  As directed      Increase activity slowly    Complete by:  As directed            Discharge Medications   Current Discharge Medication List    CONTINUE these medications which have NOT CHANGED   Details  gabapentin (NEURONTIN) 300 MG capsule Take 1 capsule (300 mg total) by mouth 3 (three) times daily. Qty: 270 capsule, Refills: 3    KLOR-CON M20 20 MEQ tablet Take 20 mEq by mouth 2 (two) times daily.    metoprolol succinate (TOPROL-XL) 25 MG 24 hr tablet Take 1 tablet (25 mg total) by mouth daily. Qty: 90 tablet, Refills: 3    Potassium Chloride ER 20 MEQ TBCR Take 20 mEq by mouth 2 (two) times daily. Qty: 180 tablet, Refills: 3   Associated Diagnoses: SOB (shortness of breath); Mild aortic regurgitation    Rivaroxaban (XARELTO) 15 MG TABS tablet Take 1 tablet (15 mg total) by mouth daily with supper. Qty: 90 tablet, Refills: 3    sildenafil (REVATIO) 20 MG tablet Take 1 tablet (20 mg total) by mouth 3 (three) times daily. Qty: 90 tablet, Refills: 0    torsemide (DEMADEX) 20 MG tablet Take 1-2 tablets (20-40  mg total) by mouth 2 (two) times daily. Two in the AM and one in the PM Qty: 360 tablet, Refills: 3    UPTRAVI 200 & 800 MCG TBPK Take 1,000 mcg by mouth 2 (two) times daily.      STOP taking these medications     estrogens, conjugated, (PREMARIN) 0.3 MG tablet      Selexipag (UPTRAVI) 200 MCG TABS            Outstanding Labs/Studies   None  Duration of Discharge Encounter   Greater than 30 minutes including physician time.  Signed, Azalee CourseHao Layann Bluett PA-C 07/23/2015, 3:38 PM

## 2015-07-23 NOTE — Progress Notes (Signed)
Patient Name: Rachael Myers Date of Encounter: 07/23/2015     Principal Problem:   Syncope Active Problems:   Pulmonary hypertension (HCC)   Mild aortic regurgitation   Chronic atrial fibrillation (HCC)   Chronic diastolic CHF (congestive heart failure) (HCC)   Benign essential HTN   Chronic kidney disease   Breath shortness    SUBJECTIVE  Denies any CP or SOB.  No problem moving leg, good sensation of R finger  CURRENT MEDS . estrogens (conjugated)  0.3 mg Oral Daily  . gabapentin  300 mg Oral TID  . metoprolol succinate  25 mg Oral Daily  . potassium chloride  30 mEq Oral Daily  . Rivaroxaban  15 mg Oral Q supper  . Selexipag  400 mcg Oral BID  . sildenafil  20 mg Oral TID  . torsemide  20 mg Oral q1800  . torsemide  40 mg Oral Q breakfast    OBJECTIVE  Filed Vitals:   07/22/15 2235 07/22/15 2305 07/23/15 0506 07/23/15 0815  BP:  156/71 158/56 146/79  Pulse:  76 73   Temp:  98.4 F (36.9 C) 98.2 F (36.8 C)   TempSrc:  Oral Oral   Resp:  18 16   Height:      Weight:   145 lb 8 oz (65.998 kg)   SpO2: 100% 100% 94%     Intake/Output Summary (Last 24 hours) at 07/23/15 1123 Last data filed at 07/22/15 2329  Gross per 24 hour  Intake    240 ml  Output      0 ml  Net    240 ml   Filed Weights   07/22/15 2155 07/23/15 0506  Weight: 150 lb (68.04 kg) 145 lb 8 oz (65.998 kg)    PHYSICAL EXAM  General: Pleasant, NAD. Neuro: Alert and oriented X 3. Moves all extremities spontaneously. Psych: Normal affect. HEENT:  Normal  Neck: Supple without bruits or JVD. Lungs:  Resp regular and unlabored, CTA. Heart: Irregularly irregular. no s3, s4. 2/6 systolic murmur Abdomen: Soft, non-tender, non-distended, BS + x 4.  Extremities: No clubbing, cyanosis or edema. DP/PT/Radials 2+ and equal bilaterally. R hand and R knee in bandage, hematoma noted on R elbow  Accessory Clinical Findings  CBC  Recent Labs  07/23/15 0508  WBC 7.4  HGB 10.7*  HCT 33.1*    MCV 89.2  PLT 227   Basic Metabolic Panel  Recent Labs  07/22/15 2254 07/23/15 0508  NA 139 140  K 3.0* 3.3*  CL 102 104  CO2 26 27  GLUCOSE 204* 79  BUN 32* 29*  CREATININE 1.70* 1.61*  CALCIUM 8.9 9.1    TELE afib with HR 80s    ECG  No new EKG  Echocardiogram 11/13/2014  LV EF: 55% - 60%  ------------------------------------------------------------------- Indications: (I27.2).  ------------------------------------------------------------------- History: PMH: Acquired from the patient and from the patient&'s chart. Dyspnea. Atrial fibrillation. Congestive heart failure. Primary pulmonary hypertension.  ------------------------------------------------------------------- Study Conclusions  - Left ventricle: The cavity size was normal. Wall thickness was  normal. Systolic function was normal. The estimated ejection  fraction was in the range of 55% to 60%. Indeterminant diastolic  function (atrial fibrillation). Wall motion was normal; there  were no regional wall motion abnormalities. - Aortic valve: There was no stenosis. - Mitral valve: Moderately to severely calcified annulus. There was  no significant regurgitation. - Left atrium: The atrium was mildly dilated. - Right ventricle: The cavity size was normal. Systolic function  was  normal. - Right atrium: The atrium was mildly dilated. - Tricuspid valve: Peak RV-RA gradient (S): 58 mm Hg. - Pulmonary arteries: PA peak pressure: 66 mm Hg (S). - Systemic veins: IVC measured 2.1 cm with > 50% respirophasic  variation, suggesting RA pressure 8 mmHg. - Pericardium, extracardiac: A trivial pericardial effusion was  identified posterior to the heart.  Impressions:  - The patient was in atrial fibrillation. Normal LV size and  systolic function, EF 55-60%. Normal RV size and systolic  function. Moderate to severe pulmonary hypertension.    Radiology/Studies  Dg Chest 2  View  07/07/2015  CLINICAL DATA:  Worsening episodes of shortness of breath and peripheral edema for the past several months sense lung surgery; history of CHF and coronary artery disease EXAM: CHEST  2 VIEW COMPARISON:  None in PACs FINDINGS: The lungs are mildly hyperinflated. There is a small right pleural effusion. There is hemidiaphragm flattening. The interstitial markings of both lungs are increased. There is a coarse 1.5 cm calcification in the right lower lobe laterally. The cardiac silhouette is normal in size. The pulmonary vascularity is not engorged. The bony thorax exhibits no acute abnormality. There are surgical clips in the right axillary region. IMPRESSION: COPD and pulmonary fibrotic change. Superimposed interstitial pneumonia could be present. Pulmonary edema is felt less likely. There is a small right pleural effusion of uncertain duration. Previous granulomatous infection, stable. Electronically Signed   By: David  SwazilandJordan M.D.   On: 07/07/2015 12:44    ASSESSMENT AND PLAN  82 y.o. female with a PMH significant for HTN, HFpEF, PAH on Selexipag and Sildenafil, chronic atrial fibrillation previously failed dronedarone and propafenone and now anti-coagulated with Rivaroxaban, and recurrent presyncopal episodes who presented to an OSH after a brief syncopal event with resultant hand and knee lacerations.  1. Syncope  - by story sounds like orthostatic hypotension, I have discussed with nursing staff who will check her vital. No arrhythmia noted on telemetry, permanent afib. Will obtain repeat echo to see her RV function, patient states she has not noticed any benefit from [facility] and continue to have worsening SOB, given her symptom, will defer to Dr. Shirlee LatchMcLean to see if he wish to repeat RHC as outpatient.   - if echo stable, likely can be discharged today. R hand and R knee covered in dressing, reportedly had suture in the leg. Will need to followup with PCP after discharge in 1 week  to evaluate suture, and possibly removal  2. PAH with chronic SOB  - h/o parapneumonic effusion that required 4 thoacentesis and VATS with decortication and wedge resection  - Right heart cath 12/17/2014 showed PA 62/22, mean wedge 19, CO 4.21, CI 2.46. PVR 5 WU.   - PFT showed restrictive pattern concerning for interstitial lung disease  - High resolution CT 12/25/2014 mosaic attenuation throughout the lung likely due to pulm vascular disease, although CTEPH cannot be excluded. No evidence of interstitial lung disease  - V/Q scan 12/25/2014 low probability of PE. Findings suggesting COPD and scarring  - currently on Revatio and Selexipag. Per Dr. Alford HighlandMclean's note, her 6 min walk was worse on 5/5 but thought to be related to leg pain  3. Chronic diastolic HF: euvolemic on exam  4. Chronic afib, failued multaq and propafenone, On Xarelto  5. CKD III-IV   Signed, Azalee CourseHao Meng PA-C Pager: [phone] Patient seen and examined and history reviewed. Agree with above findings and plan. 82 yo WF with history of pulmonary HTN and  diastolic CHF admitted for evaluation of syncope. She has a history of syncope related to sudden drops in BP with 2 scalp lacerations in the past. Now with hematoma right elbow. Laceration of right hand and knee s/p suture.  BP currently elevated. In Afib with controlled rate. Lungs are cleaer. No edema. Will check orthostatic vitals today. If she does have orthostatic hypotension may need to reduce medication. ? Reduce torsemide to once a day. Will check Echo today to reassess RV function with pulmonary HTN. Anticipate DC later today. She has follow up with Dr. Shirlee Latch on Friday.  Nazim Kadlec [place], MDFACC 07/23/2015 11:30 AM

## 2015-07-23 NOTE — Progress Notes (Signed)
  Echocardiogram 2D Echocardiogram has been performed.  Leta JunglingCooper, Shekita Boyden M 07/23/2015, 2:18 PM

## 2015-07-23 NOTE — Plan of Care (Signed)
Problem: Safety: Goal: Ability to remain free from injury will improve Outcome: Progressing Pt verbally agrees to call out for assistance when exiting bed. Bed low and locked, bed alarm activated and call bell within reach.  Problem: Skin Integrity: Goal: Risk for impaired skin integrity will decrease Outcome: Progressing Pt has a right hand laceration with steri strips and gauze wraps in place. A right knee laceration with sutures wrapped in gauze and a right elbow hematoma open to air.   Problem: Activity: Goal: Risk for activity intolerance will decrease Outcome: Progressing Pt with mild dyspnea on exertion when ambulating to bathroom.

## 2015-07-25 ENCOUNTER — Ambulatory Visit (HOSPITAL_COMMUNITY)
Admission: RE | Admit: 2015-07-25 | Discharge: 2015-07-25 | Disposition: A | Discharge status: Home / Self Care | Payer: Medicare Other | Source: Ambulatory Visit | Attending: Cardiology | Admitting: Cardiology

## 2015-07-25 VITALS — BP 140/78 | HR 62 | Wt 148.8 lb

## 2015-07-25 DIAGNOSIS — I130 Hypertensive heart and chronic kidney disease with heart failure and stage 1 through stage 4 chronic kidney disease, or unspecified chronic kidney disease: Secondary | ICD-10-CM | POA: Diagnosis not present

## 2015-07-25 DIAGNOSIS — I480 Paroxysmal atrial fibrillation: Secondary | ICD-10-CM | POA: Insufficient documentation

## 2015-07-25 DIAGNOSIS — I272 Other secondary pulmonary hypertension: Secondary | ICD-10-CM | POA: Diagnosis not present

## 2015-07-25 DIAGNOSIS — R55 Syncope and collapse: Secondary | ICD-10-CM | POA: Insufficient documentation

## 2015-07-25 DIAGNOSIS — I5032 Chronic diastolic (congestive) heart failure: Secondary | ICD-10-CM | POA: Diagnosis not present

## 2015-07-25 DIAGNOSIS — E1122 Type 2 diabetes mellitus with diabetic chronic kidney disease: Secondary | ICD-10-CM | POA: Insufficient documentation

## 2015-07-25 DIAGNOSIS — R600 Localized edema: Secondary | ICD-10-CM | POA: Insufficient documentation

## 2015-07-25 DIAGNOSIS — Z794 Long term (current) use of insulin: Secondary | ICD-10-CM | POA: Insufficient documentation

## 2015-07-25 DIAGNOSIS — Z8701 Personal history of pneumonia (recurrent): Secondary | ICD-10-CM | POA: Diagnosis not present

## 2015-07-25 DIAGNOSIS — Z7901 Long term (current) use of anticoagulants: Secondary | ICD-10-CM | POA: Insufficient documentation

## 2015-07-25 DIAGNOSIS — N189 Chronic kidney disease, unspecified: Secondary | ICD-10-CM | POA: Insufficient documentation

## 2015-07-25 DIAGNOSIS — X58XXXA Exposure to other specified factors, initial encounter: Secondary | ICD-10-CM | POA: Diagnosis not present

## 2015-07-25 DIAGNOSIS — I482 Chronic atrial fibrillation, unspecified: Secondary | ICD-10-CM

## 2015-07-25 DIAGNOSIS — S61411A Laceration without foreign body of right hand, initial encounter: Secondary | ICD-10-CM | POA: Diagnosis not present

## 2015-07-25 DIAGNOSIS — R42 Dizziness and giddiness: Secondary | ICD-10-CM | POA: Diagnosis not present

## 2015-07-25 DIAGNOSIS — L089 Local infection of the skin and subcutaneous tissue, unspecified: Secondary | ICD-10-CM | POA: Insufficient documentation

## 2015-07-25 MED ORDER — DOXYCYCLINE HYCLATE 100 MG PO TABS: 100.0000 mg | ORAL_TABLET | Freq: Two times a day (BID) | ORAL | Status: DC

## 2015-07-25 MED ORDER — PYRIDOSTIGMINE BROMIDE 60 MG PO TABS: 30.0000 mg | ORAL_TABLET | Freq: Three times a day (TID) | ORAL | Status: DC

## 2015-07-25 NOTE — Patient Instructions (Signed)
Start Pyridostigmine 30 mg (1/2 tab) Three times a day for dizziness  Start Doxycycline 100 mg Twice daily for 14 days  PLEASE SEE YOUR PRIMARY CARE MD TODAY  Your physician recommends that you schedule a follow-up appointment in: 1 month

## 2015-07-27 NOTE — Progress Notes (Signed)
Patient ID: Rachael Myers, female   DOB: [birth year], 82 y.o.   MRN: [id number]    Advanced Heart Failure Clinic Note   PCP: Dr. Royanne Foots Cardiology: Dr. Excell Seltzer HF Cardiology: Dr. Shirlee Latch  82 yo with chronic diastolic CHF, chronic atrial fibrillation, and prominent exertional dyspnea presents to CHF clinic for evaluation.  She has had atrial fibrillation for over a year and failed Multaq and propafenone use.  She is in atrial fibrillation chronically now.  In 1/16, she developed PNA.  She had a parapneumonic effusion that required 4 thoracenteses.  Eventually, she had VATS with decortication and a wedge resection.  Cytology was negative from the pleural fluid.  Last chest CT was at [facility] in 8/16 and showed no PE, mild interstitial edema.  Last echo was done in 11/16, showing normal LV EF and apparently normal RV EF.  PA systolic pressure estimation was moderate to severely increased.  I took her for RHC in 12/16, this showed moderate pulmonary arterial hypertension.  PFTs showed a restrictive pattern concerning for interstitial lung disease.  V/Q scan in 12/16 was not suggestive of chronic PE.  Despite abnormal PFTs, high resolution CT did not show interstitial lung disease.    At a prior appointment, started her on Opsumit. She developed a cough and mouth soreness with this medication and had to stop it after about 7 days. Symptoms resolved when she stopped it. Tried to get Adcirca for her. This was denied by her insurance company, so she was started on Revatio 20 mg tid instead. She thinks that this has helped her breathing "a little." She saw a pulmonary specialist who thought dyspnea was likely due to [facility]. Next, tried her on ambrisentan, but she developed facial swelling on ambrisentan (significant around the eyes). She also developed worsening dyspnea. She stopped ambrisentan and the swelling resolved but she was noted to be volume overloaded. Swtched her Lasix to torsemide 40 mg bid.     She presents today for regular follow up.  Taking Revatio 20 mg tid. Her Cordie Grice is at 1000 mcg bid now, increases every Saturday.  Leg pain much better with gabapentin.  She was admitted in 7/17 with syncope.  She was walking in [place], got lightheaded, and passed out.  She was admitted overnight, echo showed lower PA pressure than in the past.  Telemetry was negative for arrhythmia.  Orthostatics were negative.  She has been having lightheaded spells suddenly for about 5 years.  She will pass out or almost pass out.  Always when standing up.  Monitors worn in the past have been negative.  I checked orthostatics today => negative.   In terms of breathing, symptoms have been "up and down."  She is going to cardiac rehab in [place]. No orthopnea/PND.  When she fell, she tore the skin on the back of her right hand.  I took a look at the wound today, there is redness around it as well as drainage. She is not on an antibiotic.    Labs (9/16): K 4, creatinine 1.31, BNP 466 Labs (11/16): BNP 577 Labs (12/16); K 4, creatinine 1.3, normal rheumatoid factor and scleroderma serologies Labs (1/17): ANA positive but only 1:80 titer, dsDNA negative, SSA/B negative, CCP negative.  Labs (2/17): K 4.1, creatinine 1.48 => 1.42, BNP 487 Labs (3/17): K 4.2, creatinine 1.58, BNP 326  PMH: 1. Cardiolite (9/16) with EF 54%, no ischemia/infarction. 2. Chronic diastolic CHF: Echo (11/16) with EF 55-60%, normal RV size and systolic function,  PA systolic pressure 66 mmHg.  3. Chronic atrial fibrillation: She has been intolerant of Multaq and propafenone.  Sotalol and flecainide have not been used due to CKD.   - Echo (7/17) with EF 60-65%, mildly dilated RV with mildly decreased systolic function, PA systolic pressure 36 mmHg.  4. SBO: Managed conservatively. 5. CKD 6. OA: h/o THR and TKR.  7. H/o hyponatremia 8. Pneumonia with parapneumonic effusion with thoracenteses and eventual VATS and decortication.  9.  Pulmonary hypertension: Suspect Group 1 pulmonary hypertension.  RHC (12/16) with mean RA 5, PA 62/22 mean 40, mean PCWP 19, CI 2.46, PVR 5 WU.  PFTs (12/16) wiith FVC 71%, FEV1 73%, ratio 104%, TLC 71%, DLCO 53% => moderate restriction concerning for interstitial lung disease.  High resolution CT (12/16) did not show evidence for interstitial lung disease.  V/Q scan (12/16) did not show evidence for chronic PE.  Serologies: RF negative, scleroderma antibodies negative, ANA positive but only 1:80 titer, SSA/B negative, dsDNA negative, CCP negative.  She did not tolerate macitentan or ambrisentan.  Echo 7/17 with PA systolic pressure down to 36 mmHg.   - 6 minute walk (12/16) with 244 meters - 6 minute walk (2/17) with 232 meters - 6 minute walk (3/17) with 414 meters - 6 minute walk (4/17) with 267 meters 10. Lightheadedness/syncope.   SH: Married, lives in [place], nonsmoker, no ETOH.   FH: Brother with rheumatic fever.   ROS: All systems reviewed and negative except as per HPI.   Current Outpatient Prescriptions  Medication Sig Dispense Refill  . gabapentin (NEURONTIN) 300 MG capsule Take 1 capsule (300 mg total) by mouth 3 (three) times daily. 270 capsule 3  . KLOR-CON M20 20 MEQ tablet Take 20 mEq by mouth 2 (two) times daily.    . metoprolol succinate (TOPROL-XL) 25 MG 24 hr tablet Take 1 tablet (25 mg total) by mouth daily. 90 tablet 3  . Potassium Chloride ER 20 MEQ TBCR Take 20 mEq by mouth 2 (two) times daily. 180 tablet 3  . Rivaroxaban (XARELTO) 15 MG TABS tablet Take 1 tablet (15 mg total) by mouth daily with supper. 90 tablet 3  . sildenafil (REVATIO) 20 MG tablet Take 1 tablet (20 mg total) by mouth 3 (three) times daily. 90 tablet 0  . torsemide (DEMADEX) 20 MG tablet Take 1-2 tablets (20-40 mg total) by mouth 2 (two) times daily. Two in the AM and one in the PM 360 tablet 3  . UPTRAVI 200 & 800 MCG TBPK Take 1,000 mcg by mouth 2 (two) times daily.    Marland Kitchen doxycycline  (VIBRA-TABS) 100 MG tablet Take 1 tablet (100 mg total) by mouth 2 (two) times daily. 28 tablet 0  . pyridostigmine (MESTINON) 60 MG tablet Take 0.5 tablets (30 mg total) by mouth 3 (three) times daily. 45 tablet 3   No current facility-administered medications for this encounter.    BP 140/78   Pulse 62   Wt 148 lb 12.8 oz (67.5 kg)   SpO2 94%   BMI 23.31 kg/m   Negative orthostatics.  General: NAD Neck: JVP 8 cm, no thyromegaly or thyroid nodule.  Lungs: Clear to auscultation bilaterally with normal respiratory effort. CV: Nondisplaced PMI.  Heart irregular S1/S2, no S3/S4.  1/6 HSM LLSB.  Trace ankle edema.  No carotid bruit.  Normal pedal pulses.  Abdomen: Soft,NT, ND, no HSM. No bruits or masses. +BS  Skin: Intact without lesions or rashes.  Neurologic: Alert and oriented x 3.  Psych: Normal affect. Extremities: No clubbing or cyanosis. Right hand with laceration, surrounding redness and drainage.  HEENT: Normal.   Assessment/Plan:  1. Chronic diastolic CHF:  NYHA class II-III symptoms.  Volume status looks stable on exam though weight is up some.  - Continue current torsemide.  BMET today.  2. Pulmonary hypertension: Moderate to severe pulmonary hypertension by echo (PASP 66 mmHg). RHC showed moderate pulmonary arterial hypertension with PVR 5 WU.  PFTs with restrictive changes suggestive of interstitial process but high resolution CT did not show interstitial lung disease.  No evidence for chronic PE on V/Q scan.  Rheumatologic serologies all negative.  Possible group 1 pulmonary hypertension.  She was unable to tolerate macitentan or ambrisentan.  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  She is now on Revatio 20 mg tid and has started selexipag, currently up to 1000 mcg bid. Leg pain with selexipag is improved on gabapentin.  She does not feel like treatment of pulmonary hypertension has helped much symptomatically, but PA systolic pressure was down to 36 mmHg on most recent echo from  66 mmHg prior.   - Continue Revatio 20 mg tid and Selexipag at 1000 mcg bid.   - Continue pulmonary rehab in [place].  - 6 minute walk at next visit in 1 month.  3. Pulmonary: s/p PNA with parapneumonic effusion and eventual VATS with decortication.  As above, restrictive PFTs but no ILD on high resolution CT.  4. Atrial fibrillation: Chronic, apparently > 1 year.  For now, planning rate control/anticoagulation strategy.   - Continue Xarelto and Toprol XL.  5. HTN: BP tends to run around 140 systolic.  6. Syncope: Recent syncopal episode.  Has had these for at least 5 years now.  They always occur when standing, but not immediately after standing.  She has not been orthostatic when checked recently (today and in hospital).  No arrhythmia has been found, she has worn monitors in the past and was on telemetry in the hospital.  - ?vasovagal => episodes of at least lightheadedness occur fairly frequently with standing.  She has baseline borderline hypertension.  I am going to try her on pyridostigmine 30 mg tid to see if this helps these symptoms at all.  If not, will stop it.  - Continue to wear compression stockings.  7. Hand wound: Right hand has a laceration wound with surrounding edema, redness, and some drainage.  I am concerned that the wound by be getting infected.  I will have her start on doxycycline 100 mg bid, I asked her to see her PCP for further evaluation of this today.   [name] 07/27/2015

## 2015-08-04 ENCOUNTER — Telehealth (HOSPITAL_COMMUNITY): Payer: Self-pay | Admitting: *Deleted

## 2015-08-04 NOTE — Telephone Encounter (Signed)
Pt called this am to let us know that she was admitted to [facility] this past weekend. She states that her PCP called and told her to go the ER due to the fact that her kidney function had worsened. Pt was d/c yesterday. On discharge they decreased her Torsemide to 20 mg twice a day, added Norvasc 5 mg daily, increased metoprolol 25 mg 2 tablets once a day. Wanted to know if these were ok to take. Pt states that she has fallen 3 times in the last few weeks. Once at [facility] and once at home at night going to bathroom. Pt also said that she passed out at hospital and they called a code on her while walking to bed from bathroom.  Spoke with Dr Shirlee Latch about medications and he felt ok with the metoprolol increase, told her to take Torsemide 40 mg in the am and 20 mg in the afternoon. He said for her hold off of the Norvasc for now due to the passing out spells. He recommends her taking her BP daily to keep a check on it. He said if her BP is above 150 systolic to call us and let us know.  Called pt back to let her know. While on the phone with her she told me that they also stopped her Xarelto due to blood in her stool. Pt says that she is going to follow up her PCP to get that checked out. Will let Dr Shirlee Latch know.

## 2015-08-20 ENCOUNTER — Encounter: Payer: Self-pay | Admitting: Neurology

## 2015-08-20 ENCOUNTER — Ambulatory Visit (INDEPENDENT_AMBULATORY_CARE_PROVIDER_SITE_OTHER): Payer: Medicare Other | Admitting: Neurology

## 2015-08-20 VITALS — BP 138/72 | HR 78 | Resp 18 | Ht 67.0 in | Wt 147.5 lb

## 2015-08-20 DIAGNOSIS — I272 Other secondary pulmonary hypertension: Secondary | ICD-10-CM | POA: Diagnosis not present

## 2015-08-20 DIAGNOSIS — R55 Syncope and collapse: Secondary | ICD-10-CM

## 2015-08-20 DIAGNOSIS — I482 Chronic atrial fibrillation, unspecified: Secondary | ICD-10-CM

## 2015-08-20 NOTE — Progress Notes (Signed)
[facility]  PATIENT: Rachael Myers DOB: [birth year]  REFERRING DOCTOR OR PCP:  Royanne Foots (PCP); Dr. Shirlee Latch (Cardiology) SOURCE: patient  _________________________________   HISTORICAL  CHIEF COMPLAINT:  Chief Complaint  Patient presents with  . Loss of Consciousness    Sts. she has had 3 syncopal episodes since last ov.  Once she was at [facility] just gotten out of the car and passed out.  Sts. she was hospitalized with A-fib once./fim  . History of Head Injury    HISTORY OF PRESENT ILLNESS:  Rachael Myers is an 82 year old woman who I have previously seen for headaches after a mild head injury. His last visit she has had 3 episodes of syncope.     About 3 weeks ago, she was at [employer]. Her husband that her or in front of the store while he parked the car. When he came back she was on the ground surrounded by people. She probably had loss of consciousness for about 1 minute or less. She very quickly was back to her normal state of mind. 2 days later, while walking to the bathroom in her home she had another episode where she passed out for under a minute. She went to her PCP, Dr. Virginia Rochester.  He sent her to the hospital and she was monitored 3-4 days.    A third episode occurred while in the hospital.   She had just returned to her bed and laid back down (was horizontal) when she lost consciousness.    According to her husband, she wasn't breathing for a few episodes and then came to and quickly was back to her baseline.     [facility] cardi[facility]atch[facility] 30 mg po tid  According to notes, she had orthostatic vital signs checked but orthostatic hyportension was not shown  She had two possibly related episodes in 2015.    In October 2015, she fell out of bed hitting the nightstand and hit her head, requiring stitches in the left temple.     She may have briefly lost consciousness.,   She fell again a month later while walking  to the bathroom and fell.   She may have lost consciousness before the fall x a couple seconds.      She has not had more falls after that.   She has had shortness of breath that affects her walking.    She does not stumble but needs frequent breaks due to SOB.     She denies any headache now.   She is on Xarelto for Atrial Fibrillation She once was cardioverted but AFib came back in 2 days.      She denies neck pain.      She has urinary frequency but is on Lasix for hr CHF.    She denies leg weakness but she has knee pain bilaterally and hip pain.    Both knees and right hip have been replaced.     REVIEW OF SYSTEMS: Constitutional: No fevers, chills, sweats, or change in appetite Eyes: No visual changes, double vision, eye pain Ear, nose and throat: No hearing loss, ear pain, nasal congestion, sore throat Cardiovascular: No chest pain, palpitations Respiratory: She reports shortness of breath. GastrointestinaI: No nausea, vomiting, diarrhea, abdominal pain, fecal incontinence Genitourinary: No dysuria, urinary retention.   She has frequency.  Musculoskeletal: No neck pain, back pain Integumentary: No rash, pruritus, skin lesions Neurological: as above Psychiatric: No depression at this time.  No anxiety  Endocrine: No palpitations, diaphoresis, change in appetite, change in weigh or increased thirst Hematologic/Lymphatic: No anemia, purpura, petechiae. Allergic/Immunologic: No itchy/runny eyes, nasal congestion, recent allergic reactions, rashes  ALLERGIES: Allergies  Allergen Reactions  . Acetaminophen Hives, Swelling and Rash    Lip swelling  . Letairis [Ambrisentan] Swelling    Significant facial/eye swelling  . Ace Inhibitors     unknown  . Atenolol     unknown  . Diltiazem Hcl     unknown  . Hctz [Hydrochlorothiazide]     unknown  . Hydrochloric Acid Other (See Comments)    hyponatremia  . Hydrocodone-Acetaminophen Other (See Comments)    Hives  . Opsumit  [Macitentan]     Mouth sores  . Oxycodone     unknown  . Tylenol [Acetaminophen] Swelling and Rash    HOME MEDICATIONS:  Current Outpatient Prescriptions:  .  doxycycline (VIBRA-TABS) 100 MG tablet, Take 1 tablet (100 mg total) by mouth 2 (two) times daily., Disp: 28 tablet, Rfl: 0 .  gabapentin (NEURONTIN) 300 MG capsule, Take 1 capsule (300 mg total) by mouth 3 (three) times daily., Disp: 270 capsule, Rfl: 3 .  metoprolol succinate (TOPROL-XL) 25 MG 24 hr tablet, Take 1 tablet (25 mg total) by mouth daily., Disp: 90 tablet, Rfl: 3 .  Potassium Chloride ER 20 MEQ TBCR, Take 20 mEq by mouth 2 (two) times daily., Disp: 180 tablet, Rfl: 3 .  pyridostigmine (MESTINON) 60 MG tablet, Take 0.5 tablets (30 mg total) by mouth 3 (three) times daily., Disp: 45 tablet, Rfl: 3 .  sildenafil (REVATIO) 20 MG tablet, Take 1 tablet (20 mg total) by mouth 3 (three) times daily., Disp: 90 tablet, Rfl: 0 .  torsemide (DEMADEX) 20 MG tablet, Take 1-2 tablets (20-40 mg total) by mouth 2 (two) times daily. Two in the AM and one in the PM, Disp: 360 tablet, Rfl: 3 .  UPTRAVI 200 & 800 MCG TBPK, Take 1,000 mcg by mouth 2 (two) times daily., Disp: , Rfl:  .  Rivaroxaban (XARELTO) 15 MG TABS tablet, Take 1 tablet (15 mg total) by mouth daily with supper. (Patient not taking: Reported on 08/20/2015), Disp: 90 tablet, Rfl: 3  PAST MEDICAL HISTORY: Past Medical History:  Diagnosis Date  . CHF (congestive heart failure) (HCC)   . Coronary artery disease   . History of echocardiogram    Echo 11/16:  EF 55-60%, no RWMA, MAC, mild LAE, normal RVF, mild RAE, PASP 66 mmHg, trivial eff  . History of lung biopsy    [facility]  . History of nuclear stress test    Myoview 9/16:  EF 54%, normal study  . History of small bowel obstruction    Managed conservatively  . Hypertension   . Hyponatremia   . Osteoarthritis     PAST SURGICAL HISTORY: Past Surgical History:  Procedure Laterality Date  . CARDIAC  CATHETERIZATION N/A 12/17/2014   Procedure: Right Heart Cath;  Surgeon: Laurey Moralealton S McLean, MD;  [facility];  Service: Cardiovascular;  Laterality: N/A;  . RECTOCELE REPAIR    . TOTAL HIP ARTHROPLASTY    . TOTAL KNEE ARTHROPLASTY    . VESICOVAGINAL FISTULA CLOSURE W/ TAH      FAMILY HISTORY: Family History  Problem Relation Age of Onset  . Cancer Mother     SOCIAL HISTORY:  Social History   Social History  . Marital status: Married    Spouse name: N/A  . Number of children: N/A  .  Years of education: N/A   Occupational History  . retired    Social History Main Topics  . Smoking status: Never Smoker  . Smokeless tobacco: Not on file  . Alcohol use No  . Drug use: No  . Sexual activity: Not on file   Other Topics Concern  . Not on file   Social History Narrative   ** Merged History Encounter **       The patient is married. She is retired from work. She has no history of tobacco or alcohol use.     PHYSICAL EXAM  Vitals:   08/20/15 1420  BP: 138/72  Pulse: 78  Resp: 18  Weight: 147 lb 8 oz (66.9 kg)  Height: 5\' 7"  (1.702 m)    Body mass index is 23.1 kg/m.  Orthostatic VS: Laying:   140/76   68 Sitting    140/70   72 Standing (30 sec)   135/72    72 Standing (3 Min)   138/70  76   General: The patient is well-developed and well-nourished and in no acute distress  Neck: The neck is supple, no carotid bruits are noted.  The neck is nontender with good ROM.  Cardiovascular: The heart has a regular rate and rhythm with a normal S1 and S2. There were no murmurs, gallops or rubs.    Skin: Extremities are without significant edema.  Musculoskeletal:  Back is nontender  Neurologic Exam  Mental status: The patient is alert and oriented x 3 at the time of the examination. The patient has apparent normal recent and remote memory, with an apparently normal attention span and concentration ability.   Speech is normal.  Cranial nerves:  Extraocular movements are full.   Facial symmetry is present. There is good facial sensation to soft touch bilaterally.Facial strength is normal.  Trapezius and sternocleidomastoid strength is normal. No dysarthria is noted.  The tongue is midline, and the patient has symmetric elevation of the soft palate. No obvious hearing deficits are noted.  Motor:  Muscle bulk is normal.   Tone is normal. Strength is  5 / 5 in all 4 extremities.   Sensory: Sensory testing is intact to pinprick, soft touch and vibration sensation in all 4 extremities.  Coordination: Cerebellar testing reveals good finger-nose-finger and heel-to-shin bilaterally.  Gait and station: Station is normal.   Gait is normal. Tandem gait is normal. Romberg is negative.   Reflexes: Deep tendon reflexes are symmetric and normal bilaterally.   Plantar responses are flexor.    DIAGNOSTIC DATA (LABS, IMAGING, TESTING) - I reviewed patient records, labs, notes, testing and imaging myself where available.  Lab Results  Component Value Date   WBC 7.4 07/23/2015   HGB 10.7 (L) 07/23/2015   HCT 33.1 (L) 07/23/2015   MCV 89.2 07/23/2015   PLT 227 07/23/2015      Component Value Date/Time   NA 140 07/23/2015 0508   K 3.3 (L) 07/23/2015 0508   CL 104 07/23/2015 0508   CO2 27 07/23/2015 0508   GLUCOSE 79 07/23/2015 0508   BUN 29 (H) 07/23/2015 0508   CREATININE 1.61 (H) 07/23/2015 0508   CALCIUM 9.1 07/23/2015 0508   PROT 7.3 08/09/2012 0916   ALBUMIN 3.6 08/09/2012 0916   AST 17 08/09/2012 0916   ALT 11 08/09/2012 0916   ALKPHOS 114 08/09/2012 0916   BILITOT 0.5 08/09/2012 0916   GFRNONAA 28 (L) 07/23/2015 0508   GFRAA 33 (L) 07/23/2015 0508   Lab Results  Component Value Date   CHOL 176 08/09/2012   HDL 68.00 08/09/2012   LDLCALC 88 08/09/2012   TRIG 102.0 08/09/2012   CHOLHDL 3 08/09/2012       ASSESSMENT AND PLAN  Syncope, unspecified syncope type  Pulmonary hypertension (HCC)  Chronic atrial  fibrillation (HCC)   1.    Etiology of her syncope episodes is unclear. Seizure is unlikely with her very rapid return to baseline mental status but we will check an EEG to make sure that there is not any epileptiform activity.   She does not have any evidence of Shy-Drager disease or other degenerative process. 2.     She did not have orthostatic hypotension when checked today. Her 3 recent episodes occurred after Mestinon was started 07/25/15.   I discussed with Kathie RhodesBetty and her husband that she should stop it.  She has an appointment with Dr. Shirlee LatchMclean next Monday.   Due to her congestive heart failure and other cardiac history, she is probably not a candidate for fludrocortisone.   If orthostatic hypotension is documented, Midodrin could also be considered.   3.     If the spells continue, consider a tilt table test. 4.     She will return to see me as needed based on the results of the EEG.  45 minute face-to-face evaluation with greater than one half of the time counseling and coordinating care about her syncope and other issues  Yaniv Lage A. Epimenio FootSater, MD, PhD 08/20/2015, 2:32 PM Certified in Neurology, Clinical Neurophysiology, Sleep Medicine, Pain Medicine and Neuroimaging  [facility] [address], [address] [place], [place] [address] [phone]   ADDENDUM: Note faxed to Dr. Royanne Footsrr Reyden Smith A. Epimenio FootSater, MD, PhD

## 2015-08-25 ENCOUNTER — Encounter (HOSPITAL_COMMUNITY): Payer: Self-pay

## 2015-08-25 ENCOUNTER — Ambulatory Visit (HOSPITAL_COMMUNITY)
Admission: RE | Admit: 2015-08-25 | Discharge: 2015-08-25 | Disposition: A | Discharge status: Home / Self Care | Payer: Medicare Other | Source: Ambulatory Visit | Attending: Cardiology | Admitting: Cardiology

## 2015-08-25 VITALS — BP 125/56 | HR 60 | Wt 147.5 lb

## 2015-08-25 DIAGNOSIS — I5032 Chronic diastolic (congestive) heart failure: Secondary | ICD-10-CM | POA: Diagnosis present

## 2015-08-25 DIAGNOSIS — N183 Chronic kidney disease, stage 3 unspecified: Secondary | ICD-10-CM

## 2015-08-25 DIAGNOSIS — Z79899 Other long term (current) drug therapy: Secondary | ICD-10-CM | POA: Diagnosis not present

## 2015-08-25 DIAGNOSIS — Z96659 Presence of unspecified artificial knee joint: Secondary | ICD-10-CM | POA: Insufficient documentation

## 2015-08-25 DIAGNOSIS — I482 Chronic atrial fibrillation, unspecified: Secondary | ICD-10-CM

## 2015-08-25 DIAGNOSIS — Z96649 Presence of unspecified artificial hip joint: Secondary | ICD-10-CM | POA: Diagnosis not present

## 2015-08-25 DIAGNOSIS — I272 Other secondary pulmonary hypertension: Secondary | ICD-10-CM | POA: Diagnosis not present

## 2015-08-25 DIAGNOSIS — Z7901 Long term (current) use of anticoagulants: Secondary | ICD-10-CM | POA: Insufficient documentation

## 2015-08-25 DIAGNOSIS — N189 Chronic kidney disease, unspecified: Secondary | ICD-10-CM | POA: Diagnosis not present

## 2015-08-25 DIAGNOSIS — R55 Syncope and collapse: Secondary | ICD-10-CM

## 2015-08-25 DIAGNOSIS — I130 Hypertensive heart and chronic kidney disease with heart failure and stage 1 through stage 4 chronic kidney disease, or unspecified chronic kidney disease: Secondary | ICD-10-CM | POA: Insufficient documentation

## 2015-08-25 LAB — CBC
HCT: 35.6 % — ABNORMAL LOW (ref 36.0–46.0)
HEMOGLOBIN: 10.9 g/dL — AB (ref 12.0–15.0)
MCH: 26.9 pg (ref 26.0–34.0)
MCHC: 30.6 g/dL (ref 30.0–36.0)
MCV: 87.9 fL (ref 78.0–100.0)
Platelets: 249 10*3/uL (ref 150–400)
RBC: 4.05 MIL/uL (ref 3.87–5.11)
RDW: 15.2 % (ref 11.5–15.5)
WBC: 7 10*3/uL (ref 4.0–10.5)

## 2015-08-25 LAB — BASIC METABOLIC PANEL
ANION GAP: 8 (ref 5–15)
BUN: 32 mg/dL — ABNORMAL HIGH (ref 6–20)
CALCIUM: 9.3 mg/dL (ref 8.9–10.3)
CO2: 28 mmol/L (ref 22–32)
Chloride: 101 mmol/L (ref 101–111)
Creatinine, Ser: 1.87 mg/dL — ABNORMAL HIGH (ref 0.44–1.00)
GFR, EST AFRICAN AMERICAN: 27 mL/min — AB (ref >60)
GFR, EST NON AFRICAN AMERICAN: 24 mL/min — AB (ref >60)
GLUCOSE: 94 mg/dL (ref 65–99)
Potassium: 3.9 mmol/L (ref 3.5–5.1)
SODIUM: 137 mmol/L (ref 135–145)

## 2015-08-25 MED ORDER — SILDENAFIL CITRATE 20 MG PO TABS: 20.0000 mg | ORAL_TABLET | Freq: Three times a day (TID) | ORAL | 6 refills | Status: DC

## 2015-08-25 MED ORDER — PYRIDOSTIGMINE BROMIDE 60 MG PO TABS: 30.0000 mg | ORAL_TABLET | Freq: Three times a day (TID) | ORAL | 3 refills | Status: DC

## 2015-08-25 NOTE — Progress Notes (Signed)
6 min walk test completed.  Pt ambulated 790 ft (240.8 m).

## 2015-08-25 NOTE — Progress Notes (Signed)
Patient ID: Rachael BergeronBetty Myers, female   DOB: 1929/11/22, 82 y.o.   MRN: [id number]    Advanced Heart Failure Clinic Note   PCP: Dr. Royanne Footsichard Orr Cardiology: Dr. Excell Seltzerooper HF Cardiology: Dr. Shirlee LatchMcLean  82 yo with chronic diastolic CHF, chronic atrial fibrillation, and prominent exertional dyspnea presents to CHF clinic for evaluation.  She has had atrial fibrillation long-term and has failed Multaq and propafenone use.  She is in atrial fibrillation chronically now.  In 1/16, she developed PNA.  She had a parapneumonic effusion that required 4 thoracenteses.  Eventually, she had VATS with decortication and a wedge resection.  Cytology was negative from the pleural fluid.  [facility] and showed no PE, mild interstitial edema.  Last echo was done in 11/16, showing normal LV EF and apparently normal RV EF.  PA systolic pressure estimation was moderate to severely increased.  I took her for RHC in 12/16, this showed moderate pulmonary arterial hypertension.  PFTs showed a restrictive pattern concerning for interstitial lung disease.  V/Q scan in 12/16 was not suggestive of chronic PE.  Despite abnormal PFTs, high resolution CT did not show interstitial lung disease.    At a prior appointment, started her on Opsumit. She developed a cough and mouth soreness with this medication and had to stop it after about 7 days. Symptoms resolved when she stopped it. Tried to get Adcirca for her. This was denied by her insurance company, so she was started on Revatio 20 mg tid instead. She thinks that this has helped her breathing "a little." She saw a pulmonary specialist who [facility]. Next, tried her on ambrisentan, but she developed facial swelling on ambrisentan (significant around the eyes). She also developed worsening dyspnea. She stopped ambrisentan and the swelling resolved but she was noted to be volume overloaded. Swtched her Lasix to torsemide 40 mg bid.    She presents today for regular follow up.  Taking Revatio 20 mg tid. Her Cordie GriceUptravi is at 1000 mcg bid now, unable to tolerate further uptitration.  She was admitted in 7/17 with syncope.  [place] [employer], got lightheaded, and passed out.  She was admitted overnight, echo showed lower PA pressure than in the past.  Telemetry was negative for arrhythmia.  Orthostatics were negative.  She has been having lightheaded spells suddenly for about 5 years.  She will pass out or almost pass out.  Always when standing up.  Monitors worn in the past have been negative.  Orthostatics in the hospital and at multiple office settings have been negative. At last appointment, I started her on pyridostigmine to try to help with orthostatic intolerance/vasovagal syncope.  Just after starting this, she had 2 more episodes of syncope, one while walking to bathroom.  The second occurred while she was in the [facility] in [facility] for treatment of cellulitis.  She was on telemetry when she passed out, no arrhythmia.    She has been taking pyridostigmine twice a day.  No lightheadedness or syncope since 7/17.  She actually says she is feeling well on her current regiment.  No dyspnea walking on flat ground. No chest pain.  No orthopnea/PND.  She had some BRBPR at most recent hospitalization [facility] in [facility], and Xarelto was held.  She has had no further BRBPR or melena.   Labs (9/16): K 4, creatinine 1.31, BNP 466 Labs (11/16): BNP 577 Labs (12/16);  K 4, creatinine 1.3, normal rheumatoid factor and scleroderma serologies Labs (1/17): ANA positive but only 1:80 titer, dsDNA negative, SSA/B negative, CCP negative.  Labs (2/17): K 4.1, creatinine 1.48 => 1.42, BNP 487 Labs (3/17): K 4.2, creatinine 1.58, BNP 326 Labs (7/17): K 3.3, creatinine 1.7, hgb 10.7  PMH: 1. Cardiolite (9/16) with EF 54%, no ischemia/infarction. 2. Chronic diastolic CHF: Echo (11/16) with EF 55-60%, normal RV size and systolic  function, PA systolic pressure 66 mmHg.  3. Chronic atrial fibrillation: She has been intolerant of Multaq and propafenone.  Sotalol and flecainide have not been used due to CKD.   - Echo (7/17) with EF 60-65%, mildly dilated RV with mildly decreased systolic function, PA systolic pressure 36 mmHg.  4. SBO: Managed conservatively. 5. CKD 6. OA: h/o THR and TKR.  7. H/o hyponatremia 8. Pneumonia with parapneumonic effusion with thoracenteses and eventual VATS and decortication.  9. Pulmonary hypertension: Suspect Group 1 pulmonary hypertension.  RHC (12/16) with mean RA 5, PA 62/22 mean 40, mean PCWP 19, CI 2.46, PVR 5 WU.  PFTs (12/16) wiith FVC 71%, FEV1 73%, ratio 104%, TLC 71%, DLCO 53% => moderate restriction concerning for interstitial lung disease.  High resolution CT (12/16) did not show evidence for interstitial lung disease.  V/Q scan (12/16) did not show evidence for chronic PE.  Serologies: RF negative, scleroderma antibodies negative, ANA positive but only 1:80 titer, SSA/B negative, dsDNA negative, CCP negative.  She did not tolerate macitentan or ambrisentan.  Echo 7/17 with PA systolic pressure down to 36 mmHg.   - 6 minute walk (12/16) with 244 meters - 6 minute walk (2/17) with 232 meters - 6 minute walk (3/17) with 414 meters - 6 minute walk (4/17) with 267 meters - 6 minute walk (8/17) with 241 meters 10. Lightheadedness/syncope: Extensive workup.  Event and holter monitors as well as telemetry monitoring negative.  She has not been orthostatic.   SH: Married, lives in [place], nonsmoker, no ETOH.   FH: Brother with rheumatic fever.   ROS: All systems reviewed and negative except as per HPI.   Current Outpatient Prescriptions  Medication Sig Dispense Refill  . gabapentin (NEURONTIN) 300 MG capsule Take 1 capsule (300 mg total) by mouth 3 (three) times daily. 270 capsule 3  . metoprolol succinate (TOPROL-XL) 25 MG 24 hr tablet Take 1 tablet (25 mg total) by mouth daily.  90 tablet 3  . Potassium Chloride ER 20 MEQ TBCR Take 20 mEq by mouth 2 (two) times daily. 180 tablet 3  . pyridostigmine (MESTINON) 60 MG tablet Take 0.5 tablets (30 mg total) by mouth 3 (three) times daily. 45 tablet 3  . sildenafil (REVATIO) 20 MG tablet Take 1 tablet (20 mg total) by mouth 3 (three) times daily. 90 tablet 6  . torsemide (DEMADEX) 20 MG tablet Take 1-2 tablets (20-40 mg total) by mouth 2 (two) times daily. Two in the AM and one in the PM 360 tablet 3  . UPTRAVI 200 & 800 MCG TBPK Take 800 mcg by mouth 2 (two) times daily.      No current facility-administered medications for this encounter.    BP (!) 125/56   Pulse 60   Wt 147 lb 8 oz (66.9 kg)   SpO2 98%   BMI 23.10 kg/m   Negative orthostatics.  General: NAD Neck: JVP 8 cm, no thyromegaly or thyroid nodule.  Lungs: Clear to auscultation bilaterally with normal respiratory effort. CV: Nondisplaced PMI.  Heart irregular S1/S2,  no S3/S4.  1/6 HSM LLSB.  No edema.  No carotid bruit.  Normal pedal pulses.  Abdomen: Soft,NT, ND, no HSM. No bruits or masses. +BS  Skin: Intact without lesions or rashes.  Neurologic: Alert and oriented x 3.  Psych: Normal affect. Extremities: No clubbing or cyanosis.  HEENT: Normal.   Assessment/Plan:  1. Chronic diastolic CHF:  NYHA class II-III symptoms.  Volume status looks stable on exam. Weight down 1 lb.  - Continue current torsemide.  BMET today.  2. Pulmonary hypertension: Moderate to severe pulmonary hypertension by echo (PASP 66 mmHg). RHC showed moderate pulmonary arterial hypertension with PVR 5 WU.  PFTs with restrictive changes suggestive of interstitial process but high resolution CT did not show interstitial lung disease.  No evidence for chronic PE on V/Q scan.  Rheumatologic serologies all negative.  Possible group 1 pulmonary hypertension.  She was unable to tolerate macitentan or ambrisentan.  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  She is now on Revatio 20 mg tid and has  started selexipag, currently up to 1000 mcg bid. Leg pain with selexipag is improved on gabapentin.  She does not feel like treatment of pulmonary hypertension has helped much symptomatically, but PA systolic pressure was down to 36 mmHg on most recent echo from 66 mmHg prior.  6 minute walk is fairly stable today compared to most recent prior.  - Continue Revatio 20 mg tid and Selexipag at 1000 mcg bid, unable to tolerate uptitration of Selexipag any further.   - Continue pulmonary rehab in [place].  3. Pulmonary: s/p PNA with parapneumonic effusion and eventual VATS with decortication.  As above, restrictive PFTs but no ILD on high resolution CT.  4. Atrial fibrillation: Chronic, apparently > 1 year.  For now, planning rate control/anticoagulation strategy.   - Continue Toprol XL. - Xarelto held because of BRBPR during [facility].  No further overt bleeding.  Check CBC today.  I think that she can restart Xarelto 15 mg daily.  She is also going to contact her GI [facility] for a followup.    5. HTN: BP ok today.  6. Syncope: Recent syncopal episodes.  Has had these for at least 5 years now.  They always occur when standing, but not immediately after standing.  She has not been orthostatic when checked recently (multiple times).  No arrhythmia has been found, she has worn monitors in the past and was on telemetry in the hospital => [facility] but no events on telemetry.  She has been seen by neurology. ?vasovagal => episodes of at least lightheadedness occur fairly frequently with standing.  She has baseline borderline hypertension.  She says that she has been doing better for the last couple of weeks on pyridostigmine.  - Would increase pyridostigmine to tid rather than bid.   - Continue to wear compression stockings.  - If she has further syncope soon, would consider sending her for evaluation by Dr Graciela HusbandsKlein, ?tilt table test.   Marca Anconaalton  Vergene Marland 08/25/2015

## 2015-08-25 NOTE — Patient Instructions (Signed)
Labs today  We will contact you in 2 months to schedule your next appointment.  

## 2015-08-28 ENCOUNTER — Other Ambulatory Visit (HOSPITAL_COMMUNITY): Payer: Self-pay | Admitting: Pharmacist

## 2015-08-28 MED ORDER — SILDENAFIL CITRATE 20 MG PO TABS: 20.0000 mg | ORAL_TABLET | Freq: Three times a day (TID) | ORAL | 3 refills | Status: DC

## 2015-09-10 ENCOUNTER — Telehealth: Payer: Self-pay | Admitting: Neurology

## 2015-09-10 NOTE — Telephone Encounter (Signed)
Patient called to request appointment with Dr. Epimenio FootSater today, "back hurts so bad, down to ankle, can barely put feet on the floor" x couple of days. Patient advised to contact PCP for new referral for new issue so that we can schedule appointment.

## 2015-09-10 NOTE — Telephone Encounter (Signed)
Noted.  Will work pt. in asap as soon as referral is received/fim

## 2015-09-15 ENCOUNTER — Encounter: Payer: Self-pay | Admitting: Neurology

## 2015-09-15 ENCOUNTER — Ambulatory Visit (INDEPENDENT_AMBULATORY_CARE_PROVIDER_SITE_OTHER): Payer: Medicare Other | Admitting: Neurology

## 2015-09-15 VITALS — BP 168/82 | HR 80 | Resp 18 | Ht 67.0 in | Wt 149.0 lb

## 2015-09-15 DIAGNOSIS — M5431 Sciatica, right side: Secondary | ICD-10-CM

## 2015-09-15 DIAGNOSIS — R55 Syncope and collapse: Secondary | ICD-10-CM | POA: Diagnosis not present

## 2015-09-15 DIAGNOSIS — M5417 Radiculopathy, lumbosacral region: Secondary | ICD-10-CM | POA: Diagnosis not present

## 2015-09-15 NOTE — Progress Notes (Signed)
[facility]  PATIENT: Rachael Myers DOB: [birth year]  REFERRING DOCTOR OR PCP:  Dr. Virginia Rochester SOURCE: patient, records form Dr. Virginia Rochester  _________________________________   HISTORICAL  CHIEF COMPLAINT:  Chief Complaint  Patient presents with  . Back Pain    Seen in the past for h/a, syncope.  Here today for new c/o of right sided lbp radiating into right buttock and down right leg to ankle, with some numbness in her right foot.  Onset 2 weeks ago without known injury.  Hx. of  right hip replacement in 2012, and bilat knee replacements by Dr. Thamas Jaegers in [place]/fim    HISTORY OF PRESENT ILLNESS:  Rachael Myers is an 82 year old woman who I had previously seen for syncopal episodes who now reports back pain and pain and numbness that radiates down the right leg  About 2 weeks ago, she fell (no LOC). The next day she noted a lot more back pain and buttock pain. Additionally she had pain and numbness that would radiate down the legs to the side and bottom of the right foot. Pain would intensify most first thing in the morning when she got out of bed. Pain was generally less with sitting. She has not taken any medications for it.   She denies any weakness in the legs.  She has numbness in the same distribution as the pain (side and bottom of right foot). The numbness is constant. There is no bowel or bladder change.  She continues to be on pyridostigmine for syncope and notes that she has not had any further episodes.    He has not had clear-cut orthostatic hypotension.   Because Riki Rusk syncopal episode at the store lasted more than just a few seconds, and EEG is scheduled for next week.  The neck pain and headache that she was experiencing in the past are both better.    REVIEW OF SYSTEMS: Constitutional: No fevers, chills, sweats, or change in appetite Eyes: No visual changes, double vision, eye pain Ear, nose and throat: No hearing loss, ear pain, nasal congestion, sore  throat Cardiovascular: No chest pain, palpitations Respiratory: No shortness of breath at rest or with exertion.   No wheezes GastrointestinaI: No nausea, vomiting, diarrhea, abdominal pain, fecal incontinence Genitourinary: No dysuria, urinary retention or frequency.  No nocturia. Musculoskeletal: No neck pain, back pain Integumentary: No rash, pruritus, skin lesions Neurological: as above Psychiatric: No depression at this time.  No anxiety Endocrine: No palpitations, diaphoresis, change in appetite, change in weigh or increased thirst Hematologic/Lymphatic: No anemia, purpura, petechiae. Allergic/Immunologic: No itchy/runny eyes, nasal congestion, recent allergic reactions, rashes  ALLERGIES: Allergies  Allergen Reactions  . Acetaminophen Hives, Swelling and Rash    Lip swelling  . Letairis [Ambrisentan] Swelling    Significant facial/eye swelling  . Ace Inhibitors     unknown  . Atenolol     unknown  . Diltiazem Hcl     unknown  . Hctz [Hydrochlorothiazide]     unknown  . Hydrochloric Acid Other (See Comments)    hyponatremia  . Hydrocodone-Acetaminophen Other (See Comments)    Hives  . Opsumit [Macitentan]     Mouth sores  . Oxycodone     unknown  . Tylenol [Acetaminophen] Swelling and Rash    HOME MEDICATIONS:  Current Outpatient Prescriptions:  .  gabapentin (NEURONTIN) 300 MG capsule, Take 1 capsule (300 mg total) by mouth 3 (three) times daily., Disp: 270 capsule, Rfl: 3 .  metoprolol succinate (TOPROL-XL) 25 MG 24  hr tablet, Take 1 tablet (25 mg total) by mouth daily., Disp: 90 tablet, Rfl: 3 .  Potassium Chloride ER 20 MEQ TBCR, Take 20 mEq by mouth 2 (two) times daily., Disp: 180 tablet, Rfl: 3 .  pyridostigmine (MESTINON) 60 MG tablet, Take 0.5 tablets (30 mg total) by mouth 3 (three) times daily., Disp: 45 tablet, Rfl: 3 .  sildenafil (REVATIO) 20 MG tablet, Take 1 tablet (20 mg total) by mouth 3 (three) times daily., Disp: 270 tablet, Rfl: 3 .   torsemide (DEMADEX) 20 MG tablet, Take 1-2 tablets (20-40 mg total) by mouth 2 (two) times daily. Two in the AM and one in the PM, Disp: 360 tablet, Rfl: 3 .  UPTRAVI 200 & 800 MCG TBPK, Take 800 mcg by mouth 2 (two) times daily. , Disp: , Rfl:   PAST MEDICAL HISTORY: Past Medical History:  Diagnosis Date  . CHF (congestive heart failure) (HCC)   . Coronary artery disease   . History of echocardiogram    Echo 11/16:  EF 55-60%, no RWMA, MAC, mild LAE, normal RVF, mild RAE, PASP 66 mmHg, trivial eff  . History of lung biopsy    [facility] 01/2014  . History of nuclear stress test    Myoview 9/16:  EF 54%, normal study  . History of small bowel obstruction    Managed conservatively  . Hypertension   . Hyponatremia   . Osteoarthritis     PAST SURGICAL HISTORY: Past Surgical History:  Procedure Laterality Date  . CARDIAC CATHETERIZATION N/A 12/17/2014   Procedure: Right Heart Cath;  Surgeon: Laurey Morale, MD;  Location[facility] INVASIVE CV LAB;  Service: Cardiovascular;  Laterality: N/A;  . RECTOCELE REPAIR    . TOTAL HIP ARTHROPLASTY    . TOTAL KNEE ARTHROPLASTY    . VESICOVAGINAL FISTULA CLOSURE W/ TAH      FAMILY HISTORY: Family History  Problem Relation Age of Onset  . Cancer Mother     SOCIAL HISTORY:  Social History   Social History  . Marital status: Married    Spouse name: N/A  . Number of children: N/A  . Years of education: N/A   Occupational History  . retired    Social History Main Topics  . Smoking status: Never Smoker  . Smokeless tobacco: Not on file  . Alcohol use No  . Drug use: No  . Sexual activity: Not on file   Other Topics Concern  . Not on file   Social History Narrative   ** Merged History Encounter **       The patient is married. She is retired from work. She has no history of tobacco or alcohol use.     PHYSICAL EXAM  Vitals:   09/15/15 1013  BP: (!) 168/82  Pulse: 80  Resp: 18  Weight: 149 lb (67.6 kg)  Height: 5\' 7"  (1.702  m)    Body mass index is 23.34 kg/m.   General: The patient is well-developed and well-nourished and in no acute distress  Musculoskeletal:  Back is tender over the right piriformis muscle much more so than the lumbar paraspinal muscles. Additionally, she has some trochanteric bursa pain. She has had surgery on the right hip.  Neurologic Exam  Mental status: The patient is alert and oriented x 3 at the time of the examination. The patient has apparent normal recent and remote memory, with an apparently normal attention span and concentration ability.   Speech is normal.  Cranial nerves: Extraocular movements  are full.  There is good facial sensation to soft touch bilaterally. Facial strength is normal.  Trapezius and sternocleidomastoid strength is normal. No dysarthria is noted.   No obvious hearing deficits are noted.  Motor:  Muscle bulk is normal.   Tone is normal. Strength is  5 / 5 in all 4 extremities.   Sensory: Sensory testing is intact to touch and vibration sensation in the arms and left leg but she has decreased sensation to touch in an S1 distribution of the right foot  Coordination: Cerebellar testing reveals good finger-nose-finger and heel-to-shin bilaterally.  Gait and station: Station is normal.   Gait is arthritic.   She can turn in 3 steps. Romberg is negative.   Reflexes: Deep tendon reflexes are symmetric and normal in arms but she has no ankle reflexes in either foot.   Plantar responses are flexor.    DIAGNOSTIC DATA (LABS, IMAGING, TESTING) - I reviewed patient records, labs, notes, testing and imaging myself where available.  Lab Results  Component Value Date   WBC 7.0 08/25/2015   HGB 10.9 (L) 08/25/2015   HCT 35.6 (L) 08/25/2015   MCV 87.9 08/25/2015   PLT 249 08/25/2015      Component Value Date/Time   NA 137 08/25/2015 1315   K 3.9 08/25/2015 1315   CL 101 08/25/2015 1315   CO2 28 08/25/2015 1315   GLUCOSE 94 08/25/2015 1315   BUN 32 (H)  08/25/2015 1315   CREATININE 1.87 (H) 08/25/2015 1315   CALCIUM 9.3 08/25/2015 1315   PROT 7.3 08/09/2012 0916   ALBUMIN 3.6 08/09/2012 0916   AST 17 08/09/2012 0916   ALT 11 08/09/2012 0916   ALKPHOS 114 08/09/2012 0916   BILITOT 0.5 08/09/2012 0916   GFRNONAA 24 (L) 08/25/2015 1315   GFRAA 27 (L) 08/25/2015 1315   Lab Results  Component Value Date   CHOL 176 08/09/2012   HDL 68.00 08/09/2012   LDLCALC 88 08/09/2012   TRIG 102.0 08/09/2012   CHOLHDL 3 08/09/2012       ASSESSMENT AND PLAN  Lumbosacral radiculopathy at S1 - Plan: MR Lumbar Spine Wo Contrast  Syncope, unspecified syncope type  Right sided sciatica   1.   Piriformis trigger point injection on the right with 80 mg Depo-Medrol in 3 mL Marcaine. She tolerated the procedure well and there were no complications.    2.    MRI of the lumbar spine without contrast to evaluate the cause of her S1 radiculopathy   consider epidural steroid injection versus based on results of the MRI. 3.    She will continue pyridostigmine for her suspected orthostatic hypotension. 4.    Return in 2-3 months or sooner if there are new or worsening neurologic symptoms.   Alithea Lapage A. Epimenio FootSater, MD, PhD 09/15/2015, 11:10 AM Certified in Neurology, Clinical Neurophysiology, Sleep Medicine, Pain Medicine and Neuroimaging  [facility] [address], [address] [place], [place] [address] [phone]

## 2015-09-15 NOTE — Progress Notes (Signed)
     Lumbosacral radiculopathy at S1  Syncope, unspecified syncope type  Right sided sciatica

## 2015-09-18 ENCOUNTER — Telehealth: Payer: Self-pay | Admitting: Neurology

## 2015-09-18 NOTE — Telephone Encounter (Signed)
I have spoken with Rachael Myers this morning.  [facility] is sched[facility].  I spoke with Rachael Myers at [facility] and they are able to move her MRI up to this Sunday (9-17), arrival time of 0730.  In the meantime, per RAS, she can increase Gabapentin to 1 tab in the am, one in the afternoon, 2 at hs.  She verbalized understanding of same, sts. she tolerates Gabapentin without side effects, so is agreeable with this plan/fim

## 2015-09-18 NOTE — Telephone Encounter (Signed)
Pt called said the injection she rec'd on 9/11 has not helped. She said the pain is getting worse. She said Dr Epimenio FootSater told her to call him and she is wanting to speak with him.

## 2015-09-21 ENCOUNTER — Ambulatory Visit
Admission: RE | Admit: 2015-09-21 | Discharge: 2015-09-21 | Disposition: A | Discharge status: Home / Self Care | Payer: Medicare Other | Source: Ambulatory Visit | Attending: Neurology | Admitting: Neurology

## 2015-09-21 DIAGNOSIS — M5417 Radiculopathy, lumbosacral region: Secondary | ICD-10-CM

## 2015-09-22 ENCOUNTER — Ambulatory Visit (INDEPENDENT_AMBULATORY_CARE_PROVIDER_SITE_OTHER): Payer: Medicare Other | Admitting: Neurology

## 2015-09-22 ENCOUNTER — Telehealth: Payer: Self-pay | Admitting: Neurology

## 2015-09-22 DIAGNOSIS — R55 Syncope and collapse: Secondary | ICD-10-CM

## 2015-09-22 DIAGNOSIS — M5417 Radiculopathy, lumbosacral region: Secondary | ICD-10-CM

## 2015-09-22 NOTE — Progress Notes (Signed)
   [facility]  EEG (ELECTROENCEPHALOGRAM) REPORT   STUDY DATE: 09/22/2015 PATIENT NAME: Rachael BergeronBetty Myers DOB: 1929/01/25 MRN: [id number]  ORDERING CLINICIAN: Elena Davia A. Epimenio FootSater, MD. PhD  TECHNOLOGIST: Gearldine ShownLorraine Jones TECHNIQUE: Electroencephalogram was recorded utilizing standard 10-20 system of lead placement and reformatted into average and bipolar montages.  RECORDING TIME: 22 minutes ACTIVATION: Photic stimulation and hyperventilation.  CLINICAL INFORMATION: 82 year old woman with episodes of syncope  FINDINGS:   The EEG showed a posterior dominant rhythm of 9.5 Hz that reacted to eye opening and closing. There was intermixed activities more anteriorly.   There were no spikes, sharp waves or other epileptiform activity. No slowing noted. She had a normal photic stimulation response. Hyperventilation and recovery did not change the underlying rhythms. For the second half of the study, several minutes of sleep was recorded.   EKG channel shows atrial fibrillation with a normal rate.   IMPRESSION: This EEG during wakefulness, drowsiness and sleep did not show any epileptiform activity or other abnormality. Incidental note is made of atrial fibrillation with a normal rate.   INTERPRETING PHYSICIAN:   Evadene Wardrip A. Epimenio FootSater, MD, PhD Certified in Neurology, Clinical Neurophysiology, Sleep Medicine, Pain Medicine and Neuroimaging  [facility] [address], [address] [place], [place] [address] [phone]

## 2015-09-22 NOTE — Telephone Encounter (Signed)
I spoke to Rachael Myers and Rachael Myers about the MRI of the lumbar spine in the EEG.  MRI of the lumbar spine shows severe spinal stenosis at L4-L5 and herniated disc at L5-S1 that is compressing the right S1 nerve root. The EEG is normal for age.  We discussed options. The trigger point injection did not really help. She is hoping to avoid surgery given her age.  I will set up a fluoro guided epidural steroid injection

## 2015-09-24 ENCOUNTER — Telehealth: Payer: Self-pay | Admitting: Neurology

## 2015-09-24 NOTE — Telephone Encounter (Signed)
Called and spoke to patient's husband and relayed order was faxed to Dr. Roxy CedarMoser's office for ASAP apt. Patient's and her husband wanted Dr. Maple HudsonMoser to do procedure. Order was faxed on 09/18/2015. Patient 's husband relayed he will call Dr. Roxy CedarMoser's office at [phone].

## 2015-09-26 ENCOUNTER — Other Ambulatory Visit: Payer: Self-pay

## 2015-10-28 ENCOUNTER — Encounter (HOSPITAL_COMMUNITY): Payer: Self-pay

## 2015-10-29 ENCOUNTER — Encounter (HOSPITAL_COMMUNITY): Payer: Self-pay

## 2015-10-29 ENCOUNTER — Ambulatory Visit (HOSPITAL_COMMUNITY)
Admission: RE | Admit: 2015-10-29 | Discharge: 2015-10-29 | Disposition: A | Discharge status: Home / Self Care | Payer: Medicare Other | Source: Ambulatory Visit | Attending: Cardiology | Admitting: Cardiology

## 2015-10-29 VITALS — BP 148/68 | HR 60 | Wt 146.5 lb

## 2015-10-29 DIAGNOSIS — I272 Pulmonary hypertension, unspecified: Secondary | ICD-10-CM

## 2015-10-29 DIAGNOSIS — I130 Hypertensive heart and chronic kidney disease with heart failure and stage 1 through stage 4 chronic kidney disease, or unspecified chronic kidney disease: Secondary | ICD-10-CM | POA: Insufficient documentation

## 2015-10-29 DIAGNOSIS — R55 Syncope and collapse: Secondary | ICD-10-CM | POA: Diagnosis not present

## 2015-10-29 DIAGNOSIS — I2721 Secondary pulmonary arterial hypertension: Secondary | ICD-10-CM | POA: Insufficient documentation

## 2015-10-29 DIAGNOSIS — I482 Chronic atrial fibrillation, unspecified: Secondary | ICD-10-CM

## 2015-10-29 DIAGNOSIS — I5032 Chronic diastolic (congestive) heart failure: Secondary | ICD-10-CM | POA: Insufficient documentation

## 2015-10-29 DIAGNOSIS — Z7901 Long term (current) use of anticoagulants: Secondary | ICD-10-CM | POA: Diagnosis not present

## 2015-10-29 DIAGNOSIS — Z79899 Other long term (current) drug therapy: Secondary | ICD-10-CM | POA: Insufficient documentation

## 2015-10-29 LAB — BASIC METABOLIC PANEL
ANION GAP: 13 (ref 5–15)
BUN: 29 mg/dL — ABNORMAL HIGH (ref 6–20)
CALCIUM: 8.9 mg/dL (ref 8.9–10.3)
CO2: 24 mmol/L (ref 22–32)
Chloride: 101 mmol/L (ref 101–111)
Creatinine, Ser: 1.31 mg/dL — ABNORMAL HIGH (ref 0.44–1.00)
GFR calc Af Amer: 42 mL/min — ABNORMAL LOW (ref >60)
GFR, EST NON AFRICAN AMERICAN: 36 mL/min — AB (ref >60)
Glucose, Bld: 79 mg/dL (ref 65–99)
POTASSIUM: 4 mmol/L (ref 3.5–5.1)
SODIUM: 138 mmol/L (ref 135–145)

## 2015-10-29 MED ORDER — SILDENAFIL CITRATE 20 MG PO TABS: 40.0000 mg | ORAL_TABLET | Freq: Three times a day (TID) | ORAL | 3 refills | Status: DC

## 2015-10-29 NOTE — Patient Instructions (Signed)
Increase Sildenafil to 40 mg (2 tabs) Three times a day, PLEASE DO NOT INCREASE UNTIL YOU GET THE NEW PRESCRIPTION FROM [facility]  We will contact you in 3 months to schedule your next appointment.

## 2015-10-29 NOTE — Progress Notes (Signed)
6 min walk test completed.  Pt ambulated 840 ft (256 m), O2 sat stayed in low 90s on RA, HR ranged 65-90

## 2015-10-30 NOTE — Progress Notes (Signed)
Patient ID: Rachael BergeronBetty Ormand, female   DOB: 1929-10-19, 82 y.o.   MRN: [id number]    Advanced Heart Failure Clinic Note   PCP: Dr. Royanne Footsichard Orr Cardiology: Dr. Excell Seltzerooper HF Cardiology: Dr. Shirlee LatchMcLean  82 yo with chronic diastolic CHF, chronic atrial fibrillation, and prominent exertional dyspnea presents to CHF clinic for evaluation.  She has had atrial fibrillation long-term and has failed Multaq and propafenone use.  She is in atrial fibrillation chronically now.  In 1/16, she developed PNA.  She had a parapneumonic effusion that required 4 thoracenteses.  Eventually, she had VATS with decortication and a wedge resection.  Cytology was negative from the pleural fluid.  [facility] and showed no PE, mild interstitial edema.  Last echo was done in 11/16, showing normal LV EF and apparently normal RV EF.  PA systolic pressure estimation was moderate to severely increased.  I took her for RHC in 12/16, this showed moderate pulmonary arterial hypertension.  PFTs showed a restrictive pattern concerning for interstitial lung disease.  V/Q scan in 12/16 was not suggestive of chronic PE.  Despite abnormal PFTs, high resolution CT did not show interstitial lung disease.    At a prior appointment, started her on Opsumit. She developed a cough and mouth soreness with this medication and had to stop it after about 7 days. Symptoms resolved when she stopped it. Tried to get Adcirca for her. This was denied by her insurance company, so she was started on Revatio 20 mg tid instead. She thinks that this has helped her breathing "a little." She saw a pulmonary specialist who [facility]. Next, tried her on ambrisentan, but she developed facial swelling on ambrisentan (significant around the eyes). She also developed worsening dyspnea. She stopped ambrisentan and the swelling resolved but she was noted to be volume overloaded. Swtched her Lasix to torsemide 40 mg bid.    She presents today for regular follow up.  Taking Revatio 20 mg tid. Her Cordie GriceUptravi is at 1000 mcg bid now, unable to tolerate further uptitration.  She is now on pyridostigmine with no further dizzy spells or syncope.  No falls.  Bothered more by sciatica than anything else.  Not short of breath unless she walks fast or goes up an incline.  No chest pain, orthopnea/PND, palpitations.   Labs (9/16): K 4, creatinine 1.31, BNP 466 Labs (11/16): BNP 577 Labs (12/16); K 4, creatinine 1.3, normal rheumatoid factor and scleroderma serologies Labs (1/17): ANA positive but only 1:80 titer, dsDNA negative, SSA/B negative, CCP negative.  Labs (2/17): K 4.1, creatinine 1.48 => 1.42, BNP 487 Labs (3/17): K 4.2, creatinine 1.58, BNP 326 Labs (7/17): K 3.3, creatinine 1.7, hgb 10.7 Labs (8/17): K 3.9, creatinine 1.87, HCT 35.6  PMH: 1. Cardiolite (9/16) with EF 54%, no ischemia/infarction. 2. Chronic diastolic CHF: Echo (11/16) with EF 55-60%, normal RV size and systolic function, PA systolic pressure 66 mmHg.  3. Chronic atrial fibrillation: She has been intolerant of Multaq and propafenone.  Sotalol and flecainide have not been used due to CKD.   - Echo (7/17) with EF 60-65%, mildly dilated RV with mildly decreased systolic function, PA systolic pressure 36 mmHg.  4. SBO: Managed conservatively. 5. CKD 6. OA: h/o THR and TKR.  7. H/o hyponatremia 8. Pneumonia with parapneumonic effusion with thoracenteses and eventual VATS and decortication.  9. Pulmonary hypertension: Suspect Group 1 pulmonary hypertension.  RHC (12/16) with mean RA 5, PA  62/22 mean 40, mean PCWP 19, CI 2.46, PVR 5 WU.  PFTs (12/16) wiith FVC 71%, FEV1 73%, ratio 104%, TLC 71%, DLCO 53% => moderate restriction concerning for interstitial lung disease.  High resolution CT (12/16) did not show evidence for interstitial lung disease.  V/Q scan (12/16) did not show evidence for chronic PE.  Serologies: RF negative, scleroderma antibodies  negative, ANA positive but only 1:80 titer, SSA/B negative, dsDNA negative, CCP negative.  She did not tolerate macitentan or ambrisentan.  Echo 7/17 with PA systolic pressure down to 36 mmHg.   - 6 minute walk (12/16) with 244 meters - 6 minute walk (2/17) with 232 meters - 6 minute walk (3/17) with 414 meters - 6 minute walk (4/17) with 267 meters - 6 minute walk (8/17) with 241 meters - 6 minute walk (10/17) with 256 meters 10. Lightheadedness/syncope: Extensive workup.  Event and holter monitors as well as telemetry monitoring negative.  She has not been orthostatic.  11. Sciatica  SH: Married, lives in [place], nonsmoker, no ETOH.   FH: Brother with rheumatic fever.   ROS: All systems reviewed and negative except as per HPI.   Current Outpatient Prescriptions  Medication Sig Dispense Refill  . ferrous sulfate 325 (65 FE) MG tablet Take 325 mg by mouth daily.    Marland Kitchen gabapentin (NEURONTIN) 300 MG capsule Take 1 capsule (300 mg total) by mouth 3 (three) times daily. 270 capsule 3  . metoprolol succinate (TOPROL-XL) 25 MG 24 hr tablet Take 1 tablet (25 mg total) by mouth daily. 90 tablet 3  . Potassium Chloride ER 20 MEQ TBCR Take 20 mEq by mouth 2 (two) times daily. 180 tablet 3  . pyridostigmine (MESTINON) 60 MG tablet Take 0.5 tablets (30 mg total) by mouth 3 (three) times daily. 45 tablet 3  . Selexipag (UPTRAVI) 1000 MCG TABS Take 1 tablet by mouth 2 (two) times daily.    . sildenafil (REVATIO) 20 MG tablet Take 2 tablets (40 mg total) by mouth 3 (three) times daily. 540 tablet 3  . torsemide (DEMADEX) 20 MG tablet Take 1-2 tablets (20-40 mg total) by mouth 2 (two) times daily. Two in the AM and one in the PM 360 tablet 3   No current facility-administered medications for this encounter.    BP (!) 148/68   Pulse 60   Wt 146 lb 8 oz (66.5 kg)   SpO2 98%   BMI 22.95 kg/m   Negative orthostatics.  General: NAD Neck: JVP 7 cm, no thyromegaly or thyroid nodule.  Lungs: Clear  to auscultation bilaterally with normal respiratory effort. CV: Nondisplaced PMI.  Heart irregular S1/S2, no S3/S4.  1/6 HSM LLSB.  No edema.  No carotid bruit.  Normal pedal pulses.  Abdomen: Soft,NT, ND, no HSM. No bruits or masses. +BS  Skin: Intact without lesions or rashes.  Neurologic: Alert and oriented x 3.  Psych: Normal affect. Extremities: No clubbing or cyanosis.  HEENT: Normal.   Assessment/Plan:  1. Chronic diastolic CHF:  NYHA class II-III symptoms.  Volume status looks stable on exam. Weight down 1 lb.  - Continue current torsemide.  BMET today.  2. Pulmonary hypertension: Moderate to severe pulmonary hypertension by echo (PASP 66 mmHg). RHC showed moderate pulmonary arterial hypertension with PVR 5 WU.  PFTs with restrictive changes suggestive of interstitial process but high resolution CT did not show interstitial lung disease.  No evidence for chronic PE on V/Q scan.  Rheumatologic serologies all negative.  Possible group 1  pulmonary hypertension.  She was unable to tolerate macitentan or ambrisentan.  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  She is now on Revatio 20 mg tid and has started selexipag, currently up to 1000 mcg bid, unable to increase selexipag further.  PA systolic pressure was down to 36 mmHg on most recent echo from 66 mmHg prior.  6 minute walk is fairly stable today compared to most recent prior.  - Continue Selexipag at 1000 mcg bid, unable to tolerate uptitration of Selexipag any further.  - Increase Revatio to 40 mg tid.    - Continue pulmonary rehab in [place].  3. Pulmonary: s/p PNA with parapneumonic effusion and eventual VATS with decortication.  As above, restrictive PFTs but no ILD on high resolution CT.  4. Atrial fibrillation: Chronic, apparently > 1 year.  For now, planning rate control/anticoagulation strategy.   - Continue Toprol XL. - Continue Xarelto 15 mg daily, no overt bleeding.    5. HTN: BP mildly elevated today but will not change her  meds.  6. Syncope: Has had syncopal episodes for at least 5 years now.  They always occur when standing, but not immediately after standing.  She has not been orthostatic when checked recently (multiple times).  No arrhythmia has been found, she has worn monitors in the past and was on telemetry in the hospital => had syncope at [facility] in 7/17 but no events on telemetry.  She has been seen by neurology. ?vasovagal.  She has baseline borderline hypertension.  She seems improved by pyridostigmine. - Continue pyridostigmine 30 mg tid.   - Continue to wear compression stockings.  - If she has further syncope, would consider sending her for evaluation by Dr Graciela Husbands, ?tilt table test.   [name] 10/30/2015

## 2015-10-31 ENCOUNTER — Other Ambulatory Visit (HOSPITAL_COMMUNITY): Payer: Self-pay | Admitting: *Deleted

## 2015-11-03 ENCOUNTER — Encounter (HOSPITAL_COMMUNITY): Payer: Self-pay

## 2015-11-03 NOTE — Progress Notes (Signed)
Pulmonary rehab through [place] has been DC'd due to continued inability to participate with physical limitations.  Ave FilterBradley, Megan Genevea, RN

## 2015-11-04 ENCOUNTER — Other Ambulatory Visit (HOSPITAL_COMMUNITY): Payer: Self-pay | Admitting: *Deleted

## 2015-11-17 ENCOUNTER — Ambulatory Visit (INDEPENDENT_AMBULATORY_CARE_PROVIDER_SITE_OTHER): Payer: Medicare Other | Admitting: Neurology

## 2015-11-17 ENCOUNTER — Encounter: Payer: Self-pay | Admitting: Neurology

## 2015-11-17 VITALS — BP 136/72 | HR 68 | Resp 16 | Ht 67.0 in | Wt 150.2 lb

## 2015-11-17 DIAGNOSIS — M48061 Spinal stenosis, lumbar region without neurogenic claudication: Secondary | ICD-10-CM

## 2015-11-17 DIAGNOSIS — M5127 Other intervertebral disc displacement, lumbosacral region: Secondary | ICD-10-CM | POA: Insufficient documentation

## 2015-11-17 DIAGNOSIS — R55 Syncope and collapse: Secondary | ICD-10-CM | POA: Diagnosis not present

## 2015-11-17 DIAGNOSIS — M542 Cervicalgia: Secondary | ICD-10-CM | POA: Diagnosis not present

## 2015-11-17 DIAGNOSIS — M5417 Radiculopathy, lumbosacral region: Secondary | ICD-10-CM

## 2015-11-17 MED ORDER — GABAPENTIN 600 MG PO TABS: 600.0000 mg | ORAL_TABLET | Freq: Three times a day (TID) | ORAL | 5 refills | Status: DC

## 2015-11-17 MED ORDER — GABAPENTIN 600 MG PO TABS: 600.0000 mg | ORAL_TABLET | Freq: Three times a day (TID) | ORAL | 3 refills | Status: DC

## 2015-11-17 NOTE — Progress Notes (Signed)
[facility]  PATIENT: Rachael Myers DOB: Feb 14, 1929  REFERRING DOCTOR OR PCP:  Dr. Virginia Rochester SOURCE: patient, records form Dr. Virginia Rochester  _________________________________   HISTORICAL  CHIEF COMPLAINT:  Chief Complaint  Patient presents with  . Back Pain    Sts. she continues to have lbp radiating into her left leg--sts. Right S-1 tfesi done on 10-02-15 did not help at all, even for a short time.     HISTORY OF PRESENT ILLNESS:  Rachael Myers is an 82 year old woman with a right S1 radiculopathy.   Current pain is in her lower back and radiates down into the right leg.   She has stumbled some when pain and weakness was present.   Pain would intensify most first thing in the morning when she got out of bed. Pain was generally less with sitting. She has not taken any medications for it.   She denies any weakness in the legs.  She has numbness in the same distribution as the pain (side and bottom of right foot). The numbness is constant. There is no bowel or bladder change.   An ESI at [facility] did npt help any (09/21/15).   Tramadol has not been hopeful she can tolerate other pain pills. She is on a blood thinner so cannot be on an anti-inflammatory agent. She can not take Tylenol.   Gabapentin is well tolerated but she is not sure how much it is helping.  Syncope:   She has not had any more episodes of syncope and presyncope since starting pyridostigmine.  EEG was normal for agfe.  The neck pain and headache that she was experiencing in the past are both better.    REVIEW OF SYSTEMS: Constitutional: No fevers, chills, sweats, or change in appetite Eyes: No visual changes, double vision, eye pain Ear, nose and throat: No hearing loss, ear pain, nasal congestion, sore throat Cardiovascular: No chest pain, palpitations Respiratory: No shortness of breath at rest or with exertion.   No wheezes GastrointestinaI: No nausea, vomiting, diarrhea, abdominal pain, fecal  incontinence Genitourinary: No dysuria, urinary retention or frequency.  No nocturia. Musculoskeletal: as above. Integumentary: No rash, pruritus, skin lesions Neurological: as above Psychiatric: No depression at this time.  No anxiety Endocrine: No palpitations, diaphoresis, change in appetite, change in weigh or increased thirst Hematologic/Lymphatic: No anemia, purpura, petechiae. Allergic/Immunologic: No itchy/runny eyes, nasal congestion, recent allergic reactions, rashes  ALLERGIES: Allergies  Allergen Reactions  . Acetaminophen Hives, Swelling and Rash    Lip swelling  . Letairis [Ambrisentan] Swelling    Significant facial/eye swelling  . Ace Inhibitors     unknown  . Atenolol     unknown  . Diltiazem Hcl     unknown  . Hctz [Hydrochlorothiazide]     unknown  . Hydrochloric Acid Other (See Comments)    hyponatremia  . Hydrocodone-Acetaminophen Other (See Comments)    Hives  . Opsumit [Macitentan]     Mouth sores  . Oxycodone     unknown  . Tylenol [Acetaminophen] Swelling and Rash    HOME MEDICATIONS:  Current Outpatient Prescriptions:  .  ferrous sulfate 325 (65 FE) MG tablet, Take 325 mg by mouth daily., Disp: , Rfl:  .  gabapentin (NEURONTIN) 300 MG capsule, Take 1 capsule (300 mg total) by mouth 3 (three) times daily., Disp: 270 capsule, Rfl: 3 .  metoprolol succinate (TOPROL-XL) 25 MG 24 hr tablet, Take 1 tablet (25 mg total) by mouth daily., Disp: 90 tablet, Rfl: 3 .  Potassium Chloride ER 20 MEQ TBCR, Take 20 mEq by mouth 2 (two) times daily., Disp: 180 tablet, Rfl: 3 .  pyridostigmine (MESTINON) 60 MG tablet, Take 0.5 tablets (30 mg total) by mouth 3 (three) times daily., Disp: 45 tablet, Rfl: 3 .  Selexipag (UPTRAVI) 1000 MCG TABS, Take 1 tablet by mouth 2 (two) times daily., Disp: , Rfl:  .  sildenafil (REVATIO) 20 MG tablet, Take 2 tablets (40 mg total) by mouth 3 (three) times daily., Disp: 540 tablet, Rfl: 3 .  torsemide (DEMADEX) 20 MG tablet,  Take 1-2 tablets (20-40 mg total) by mouth 2 (two) times daily. Two in the AM and one in the PM, Disp: 360 tablet, Rfl: 3  PAST MEDICAL HISTORY: Past Medical History:  Diagnosis Date  . CHF (congestive heart failure) (HCC)   . Coronary artery disease   . History of echocardiogram    Echo 11/16:  EF 55-60%, no RWMA, MAC, mild LAE, normal RVF, mild RAE, PASP 66 mmHg, trivial eff  . [facility] lung biopsy    [facility]  . History of nuclear stress test    Myoview 9/16:  EF 54%, normal study  . History of small bowel obstruction    Managed conservatively  . Hypertension   . Hyponatremia   . Osteoarthritis     PAST SURGICAL HISTORY: Past Surgical History:  Procedure Laterality Date  . CARDIAC CATHETERIZATION N/A 12/17/2014   Procedure: Right Heart Cath;  Surgeon: Laurey Moralealton S McLean, MD;  [facility];  Service: Cardiovascular;  Laterality: N/A;  . RECTOCELE REPAIR    . TOTAL HIP ARTHROPLASTY    . TOTAL KNEE ARTHROPLASTY    . VESICOVAGINAL FISTULA CLOSURE W/ TAH      FAMILY HISTORY: Family History  Problem Relation Age of Onset  . Cancer Mother     SOCIAL HISTORY:  Social History   Social History  . Marital status: Married    Spouse name: N/A  . Number of children: N/A  . Years of education: N/A   Occupational History  . retired    Social History Main Topics  . Smoking status: Never Smoker  . Smokeless tobacco: Not on file  . Alcohol use No  . Drug use: No  . Sexual activity: Not on file   Other Topics Concern  . Not on file   Social History Narrative   ** Merged History Encounter **       The patient is married. She is retired from work. She has no history of tobacco or alcohol use.     PHYSICAL EXAM  Vitals:   11/17/15 1049  BP: 136/72  Pulse: 68  Resp: 16  Weight: 150 lb 3.2 oz (68.1 kg)  Height: 5\' 7"  (1.702 m)    Body mass index is 23.52 kg/m.   General: The patient is well-developed and well-nourished and in no acute  distress  Musculoskeletal:  Back is mildly tender over the right piriformis muscle.  She has had surgery on the right hip.  Neurologic Exam  Mental status: The patient is alert and oriented x 3 at the time of the examination. The patient has apparent normal recent and remote memory, with an apparently normal attention span and concentration ability.   Speech is normal.  Cranial nerves: Extraocular movements are full.  There is good facial sensation to soft touch bilaterally. Facial strength is normal.  Trapezius and sternocleidomastoid strength is normal. No dysarthria is noted.   No obvious hearing  deficits are noted.  Motor:  Muscle bulk is normal.   Tone is normal. Strength is  5 / 5 in all 4 extremities.   However on weightbearing testing she has slight weakness in the right gastrocnemius muscles.  Sensory: Sensory testing is intact to touch and vibration sensation in the arms and left leg but she has decreased sensation to touch in an S1 distribution of the right foot  Coordination: Cerebellar testing reveals good finger-nose-finger bilaterally.  Gait and station: Station is normal.   Gait is arthritic.   She can turn in 3 steps. Romberg is negative.   Reflexes: Deep tendon reflexes are symmetric and normal in arms but she has no ankle reflexes in either foot.   Plantar responses are flexor.    DIAGNOSTIC DATA (LABS, IMAGING, TESTING) - I reviewed patient records, labs, notes, testing and imaging myself where available.  Lab Results  Component Value Date   WBC 7.0 08/25/2015   HGB 10.9 (L) 08/25/2015   HCT 35.6 (L) 08/25/2015   MCV 87.9 08/25/2015   PLT 249 08/25/2015      Component Value Date/Time   NA 138 10/29/2015 1116   K 4.0 10/29/2015 1116   CL 101 10/29/2015 1116   CO2 24 10/29/2015 1116   GLUCOSE 79 10/29/2015 1116   BUN 29 (H) 10/29/2015 1116   CREATININE 1.31 (H) 10/29/2015 1116   CALCIUM 8.9 10/29/2015 1116   PROT 7.3 08/09/2012 0916   ALBUMIN 3.6  08/09/2012 0916   AST 17 08/09/2012 0916   ALT 11 08/09/2012 0916   ALKPHOS 114 08/09/2012 0916   BILITOT 0.5 08/09/2012 0916   GFRNONAA 36 (L) 10/29/2015 1116   GFRAA 42 (L) 10/29/2015 1116   Lab Results  Component Value Date   CHOL 176 08/09/2012   HDL 68.00 08/09/2012   LDLCALC 88 08/09/2012   TRIG 102.0 08/09/2012   CHOLHDL 3 08/09/2012       ASSESSMENT AND PLAN  Lumbosacral radiculopathy at S1 - Plan: Ambulatory referral to Neurosurgery  Syncope, unspecified syncope type  Spinal stenosis of lumbar region, unspecified whether neurogenic claudication present - Plan: Ambulatory referral to Neurosurgery  Neck pain  Herniation of intervertebral disc between L5 and S1    1.   Increase gabapentin to 600 mg by mouth 3 times a day. Unfortunately, she cannot tolerate many medications.  2.   To neurosurgery for evaluation. I believe her worse pain is due to the disc herniation at L5-S1 but she also has severe spinal stenosis at L4-L5 that could certainly be playing a role.  3.    She will continue pyridostigmine for her orthostatic hypotension. 4.    Return in 3 months or sooner if there are new or worsening neurologic symptoms.   Jaikob Borgwardt A. Epimenio FootSater, MD, PhD 11/17/2015, 10:57 AM Certified in Neurology, Clinical Neurophysiology, Sleep Medicine, Pain Medicine and Neuroimaging  [facility] [address], [address] [place], [place] [address] [phone]

## 2015-11-20 ENCOUNTER — Other Ambulatory Visit (HOSPITAL_COMMUNITY): Payer: Self-pay | Admitting: *Deleted

## 2015-11-20 MED ORDER — SILDENAFIL CITRATE 20 MG PO TABS: 40.0000 mg | ORAL_TABLET | Freq: Three times a day (TID) | ORAL | 3 refills | Status: DC

## 2015-11-24 ENCOUNTER — Other Ambulatory Visit (HOSPITAL_COMMUNITY): Payer: Self-pay | Admitting: *Deleted

## 2015-11-24 MED ORDER — SELEXIPAG 1000 MCG PO TABS: 1.0000 | ORAL_TABLET | Freq: Two times a day (BID) | ORAL | 6 refills | Status: DC

## 2015-12-04 ENCOUNTER — Other Ambulatory Visit: Payer: Self-pay | Admitting: Neurological Surgery

## 2015-12-10 ENCOUNTER — Telehealth (HOSPITAL_COMMUNITY): Payer: Self-pay | Admitting: *Deleted

## 2015-12-10 NOTE — Telephone Encounter (Signed)
Received fax from [facility].  Pt needs clearance to hold Xarelto for a L4-5 maximum access posterior lumbar fusion w/Right L5-S1 microdiscectomy on 01/08/16.  Per Dr Shirlee LatchMcLean:  "May hold Xarelto x 3 days pre-surgery"  Note faxed back to them at [phone]

## 2015-12-11 ENCOUNTER — Other Ambulatory Visit (HOSPITAL_COMMUNITY): Payer: Self-pay | Admitting: Pharmacist

## 2015-12-11 MED ORDER — SELEXIPAG 1000 MCG PO TABS: 1.0000 | ORAL_TABLET | Freq: Two times a day (BID) | ORAL | 6 refills | Status: DC

## 2015-12-30 ENCOUNTER — Encounter (HOSPITAL_COMMUNITY): Payer: Self-pay

## 2015-12-30 ENCOUNTER — Encounter (HOSPITAL_COMMUNITY)
Admission: RE | Admit: 2015-12-30 | Discharge: 2015-12-30 | Disposition: A | Discharge status: Home / Self Care | Payer: Medicare Other | Source: Ambulatory Visit | Attending: Neurological Surgery | Admitting: Neurological Surgery

## 2015-12-30 ENCOUNTER — Ambulatory Visit (HOSPITAL_COMMUNITY)
Admission: RE | Admit: 2015-12-30 | Discharge: 2015-12-30 | Disposition: A | Discharge status: Home / Self Care | Payer: Medicare Other | Source: Ambulatory Visit | Attending: Anesthesiology | Admitting: Anesthesiology

## 2015-12-30 DIAGNOSIS — Z01818 Encounter for other preprocedural examination: Secondary | ICD-10-CM | POA: Diagnosis present

## 2015-12-30 DIAGNOSIS — I351 Nonrheumatic aortic (valve) insufficiency: Secondary | ICD-10-CM | POA: Insufficient documentation

## 2015-12-30 DIAGNOSIS — I509 Heart failure, unspecified: Secondary | ICD-10-CM | POA: Insufficient documentation

## 2015-12-30 DIAGNOSIS — R55 Syncope and collapse: Secondary | ICD-10-CM | POA: Diagnosis not present

## 2015-12-30 DIAGNOSIS — M5431 Sciatica, right side: Secondary | ICD-10-CM | POA: Diagnosis not present

## 2015-12-30 DIAGNOSIS — M1990 Unspecified osteoarthritis, unspecified site: Secondary | ICD-10-CM | POA: Insufficient documentation

## 2015-12-30 DIAGNOSIS — M5417 Radiculopathy, lumbosacral region: Secondary | ICD-10-CM | POA: Insufficient documentation

## 2015-12-30 DIAGNOSIS — I272 Pulmonary hypertension, unspecified: Secondary | ICD-10-CM | POA: Diagnosis not present

## 2015-12-30 DIAGNOSIS — R0602 Shortness of breath: Secondary | ICD-10-CM | POA: Insufficient documentation

## 2015-12-30 DIAGNOSIS — N189 Chronic kidney disease, unspecified: Secondary | ICD-10-CM | POA: Diagnosis not present

## 2015-12-30 DIAGNOSIS — F419 Anxiety disorder, unspecified: Secondary | ICD-10-CM | POA: Diagnosis not present

## 2015-12-30 DIAGNOSIS — I130 Hypertensive heart and chronic kidney disease with heart failure and stage 1 through stage 4 chronic kidney disease, or unspecified chronic kidney disease: Secondary | ICD-10-CM | POA: Diagnosis not present

## 2015-12-30 DIAGNOSIS — M5127 Other intervertebral disc displacement, lumbosacral region: Secondary | ICD-10-CM | POA: Insufficient documentation

## 2015-12-30 DIAGNOSIS — E785 Hyperlipidemia, unspecified: Secondary | ICD-10-CM | POA: Diagnosis not present

## 2015-12-30 DIAGNOSIS — M542 Cervicalgia: Secondary | ICD-10-CM | POA: Insufficient documentation

## 2015-12-30 DIAGNOSIS — I4891 Unspecified atrial fibrillation: Secondary | ICD-10-CM | POA: Diagnosis not present

## 2015-12-30 DIAGNOSIS — E871 Hypo-osmolality and hyponatremia: Secondary | ICD-10-CM | POA: Insufficient documentation

## 2015-12-30 DIAGNOSIS — R600 Localized edema: Secondary | ICD-10-CM | POA: Insufficient documentation

## 2015-12-30 DIAGNOSIS — Z01812 Encounter for preprocedural laboratory examination: Secondary | ICD-10-CM | POA: Diagnosis present

## 2015-12-30 DIAGNOSIS — H3581 Retinal edema: Secondary | ICD-10-CM | POA: Insufficient documentation

## 2015-12-30 HISTORY — DX: Unspecified atrial fibrillation: I48.91

## 2015-12-30 HISTORY — DX: Personal history of pneumonia (recurrent): Z87.01

## 2015-12-30 HISTORY — DX: Pulmonary hypertension, unspecified: I27.20

## 2015-12-30 HISTORY — DX: Dyspnea, unspecified: R06.00

## 2015-12-30 LAB — BASIC METABOLIC PANEL
ANION GAP: 10 (ref 5–15)
BUN: 30 mg/dL — ABNORMAL HIGH (ref 6–20)
CHLORIDE: 102 mmol/L (ref 101–111)
CO2: 26 mmol/L (ref 22–32)
Calcium: 9.1 mg/dL (ref 8.9–10.3)
Creatinine, Ser: 1.69 mg/dL — ABNORMAL HIGH (ref 0.44–1.00)
GFR calc Af Amer: 31 mL/min — ABNORMAL LOW (ref >60)
GFR calc non Af Amer: 27 mL/min — ABNORMAL LOW (ref >60)
GLUCOSE: 99 mg/dL (ref 65–99)
POTASSIUM: 4.3 mmol/L (ref 3.5–5.1)
Sodium: 138 mmol/L (ref 135–145)

## 2015-12-30 LAB — CBC
HEMATOCRIT: 35.1 % — AB (ref 36.0–46.0)
HEMOGLOBIN: 11.1 g/dL — AB (ref 12.0–15.0)
MCH: 29.6 pg (ref 26.0–34.0)
MCHC: 31.6 g/dL (ref 30.0–36.0)
MCV: 93.6 fL (ref 78.0–100.0)
Platelets: 286 10*3/uL (ref 150–400)
RBC: 3.75 MIL/uL — ABNORMAL LOW (ref 3.87–5.11)
RDW: 16.8 % — ABNORMAL HIGH (ref 11.5–15.5)
WBC: 6.4 10*3/uL (ref 4.0–10.5)

## 2015-12-30 LAB — TYPE AND SCREEN
ABO/RH(D): O POS
Antibody Screen: NEGATIVE

## 2015-12-30 LAB — SURGICAL PCR SCREEN
MRSA, PCR: NEGATIVE
STAPHYLOCOCCUS AUREUS: NEGATIVE

## 2015-12-30 LAB — ABO/RH: ABO/RH(D): O POS

## 2015-12-30 NOTE — Progress Notes (Signed)
PCP - Royanne Footsichard Orr Cardiologist - Mclean - clearance in epic - patient wants to see him   Chest x-ray - 12/30/15 for shortness of breath  EKG - 07/22/15 Stress Test - 10/01/14 ECHO - 07/23/15 Cardiac Cath - 12/17/14  Anesthesia was called to see patient today before patient is to go home for review of history  Patient is stopping xarelto on the 1st per MD  Patient denies  fever, cough and chest pain at PAT appointment

## 2015-12-30 NOTE — Progress Notes (Signed)
Your procedure is scheduled on Thursday January 4.            [facility]            Call this number if you have problems the morning of surgery:            [phone]             Remember:            Do not eat food or drink liquids after midnight.            Take these medicines the morning of surgery with A SIP OF WATER: gabapentin (neurontin), metoprolol (Toprol-XL), sildenafil (REVATIO), Selexipag (UPTRAVI, pyridostigmine (MESTINON)  7 days prior to surgery STOP taking any Aspirin, Aleve, Naproxen, Ibuprofen, Motrin, Advil, Goody's, BC's, all herbal medications, fish oil, and all vitamins              Do not wear jewelry, make-up or nail polish.            Do not wear lotions, powders, or perfumes, or deoderant.            Do not shave 48 hours prior to surgery.              Do not bring valuables to the hospital.            [facility] any belongings or valuables.  Contacts, dentures or bridgework may not be worn into surgery.  Leave your suitcase in the car.  After surgery it may be brought to your room.  For patients admitted to the hospital, discharge time will be determined by your treatment team.  Patients discharged the day of surgery will not be allowed to drive home.    Special instructions:    [facility]- Preparing For Surgery  Before surgery, you can play an important role. Because skin is not sterile, your skin needs to be as free of germs as possible. You can reduce the number of germs on your skin by washing with CHG (chlorahexidine gluconate) Soap before surgery.  CHG is an antiseptic cleaner which kills germs and bonds with the skin to continue killing germs even after washing.  Please do not use if you have an allergy to CHG or antibacterial soaps. If your skin becomes reddened/irritated stop using the CHG.  Do not shave (including legs and underarms) for at least 48 hours prior  to first CHG shower. It is OK to shave your face.  Please follow these instructions carefully.                                                                                                                     1. Shower the NIGHT BEFORE SURGERY and the MORNING OF SURGERY with CHG.   2. If you chose to wash your hair, wash your hair first as usual with your normal shampoo.  3. After you  shampoo, rinse your hair and body thoroughly to remove the shampoo.  4. Use CHG as you would any other liquid soap. You can apply CHG directly to the skin and wash gently with a scrungie or a clean washcloth.   5. Apply the CHG Soap to your body ONLY FROM THE NECK DOWN.  Do not use on open wounds or open sores. Avoid contact with your eyes, ears, mouth and genitals (private parts). Wash genitals (private parts) with your normal soap.  6. Wash thoroughly, paying special attention to the area where your surgery will be performed.  7. Thoroughly rinse your body with warm water from the neck down.  8. DO NOT shower/wash with your normal soap after using and rinsing off the CHG Soap.  9. Pat yourself dry with a CLEAN TOWEL.   10. Wear CLEAN PAJAMAS   11. Place CLEAN SHEETS on your bed the night of your first shower and DO NOT SLEEP WITH PETS.    Day of Surgery: Do not apply any deodorants/lotions. Please wear clean clothes to the hospital/surgery center.      Please read over the following fact sheets that you were given. MRSA Information

## 2015-12-30 NOTE — Progress Notes (Addendum)
Anesthesia PAT Evaluation: Patient is a 82 year old female scheduled for L4-5 MAS, PLIF with Gill laminectomies, bilaterally with right L5-S1 microdiscectomy on 01/08/16 by Dr. Bevely Palmer. OR room is booked for 5 hours.   History includes non-smoker, osteoarthritis, hyponatremia, small bowel obstruction (conservative management), hypertension, chronic diastolic CHF, atrial fibrillation, moderate to severe pulmonary hypertension, bilateral TKA, right THA '12 , dyspnea, recurrent left parapneumonic effusion s/p left VATS pneumolysis and LUL wedge resection (negative for malignancy) 07/24/14 (Dr. Derrill Memo, [facility]). CAD is listed, but she is unsure of this diagnosis--no history of MI or PCI.  - Admitted [facility] 07/22/15-07/23/15 for syncope.  - Admitted [facility] 07/30/15-08/03/15 for RUE cellulitis with history of RUE injury following syncopal episode. Also with acute on chronic kidney failure (Cr 3.10 after placed on Bactrim at [facility]. Bactrim discontinued). She was started on pyridostigmine (07/25/15) for possible vasovagal syncope. She reports last syncopal episode 07/2015.  - Seen by Dr. Royanne Foots on 12/24/15 for routine follow-up. Reported new onset BLE rash (started ~ 12/23/15). No itching or fever. No known injury or exposure. She wears compression stocking, but this is not new. Patient tells me that Dr. Virginia Rochester was not sure what was causing her rash. 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 She feels rash is "much worse" from when she saw Dr. Virginia Rochester last week.  - PCP is Dr. Royanne Foots with [facility] in [facility]. - Cardiologist was Dr. Tonny Bollman, but most recently she has been seeing Dr. [name] at the [facility] since 11/2014. Dr. Shirlee Latch is aware of surgery plans and gave permission to hold Xarelto for three days prior to surgery.  - Pulmonologist is Dr. Kalman Shan. - Neurologist is Dr. Despina Arias.  - Nephrologist is Dr. Jaye Beagle.  Husband's name is Adrea Sherpa, cell [phone].  Meds include ferrous sulfate, gabapentin, Toprol, KCl, Mestinon, Xarelto (to hold three days pre-op), selexipag Cordie Grice), sildenafil (Revatio), torsemide.   BP 126/76   Pulse 75   Temp 36.7 C   Resp 20   Ht 5' 6.5" (1.689 m)   Wt 153 lb 12.8 oz (69.8 kg)   SpO2 97%   BMI 24.45 kg/m   Patient reports she has good and bad days with SOB. A little more SOB over the holidays, but has been more busy. She feels like she can do whatever she wants activity wise, although in October she had to exit out of pulmonary rehab because she was no longer able to participate given her back issues. She is able to sleep flat with one pillow.  No fever.  Exam shows a pleasant Caucasian female in NAD. She has an upper mouth "plate" and lower partial. Heart irregularly irregular, rate controlled. I/VI SEM. Lungs clear. She has 1+ pretibial edema. She has petechial/purpura type rash on BLE and to a lesser extent above the knee. There is associated erythema, particularly on the RLE. She does have evidence of varicosities.   EKG 07/22/15: Afib at 70 bpm, LAFB.   Echo 07/23/15: Study Conclusions - Left ventricle: The cavity size was normal. Wall thickness was   normal. Systolic function was normal. The estimated ejection   fraction was in the range of 60% to 65%. Wall motion was normal;   there were no regional wall motion abnormalities. The study is   not technically sufficient to allow evaluation of LV diastolic   function. - Aortic  valve: Sclerosis without stenosis. There was mild   regurgitation. - Mitral valve: Posterior calcified annulus. Mildly thickened   leaflets . There was mild regurgitation. - Left atrium: The atrium was normal in size. - Right ventricle: The cavity size was mildly dilated. Systolic   function is reduced. - Right atrium: The atrium was mildly  dilated. - Tricuspid valve: There was mild regurgitation. - Pulmonary arteries: PA peak pressure: 36 mm Hg (S). - Inferior vena cava: The vessel was normal in size. The   respirophasic diameter changes were in the normal range (>= 50%),   consistent with normal central venous pressure. Impressions: - Compared to a prior echo in 11/2014, the LVEF is higher at   60-65%, RVSP appears lower at 36 mmHg in this study.  RHC 12/17/14: Moderate pulmonary hypertension. mean RA 5, PA 62/22 mean 40, mean PCWP 19, CI 2.46, PVR 5 WU. Mildly elevated PCWP.  Plan: - She will continue Lasix at current dose.  - She will need full workup for pulmonary hypertension => needs high resolution CT of chest given abnormal PFTs, needs V/Q scan, needs rheumatologic serologies and referral to pulmonary.  - We will work on starting her on macitentan and proceed with combination therapy with Adcirca after that.   Myoview 10/01/14: Myocardial perfusion is normal. The study is normal. This is a low risk study. Overall left ventricular systolic function was abnormal. LV cavity size is normal. Nuclear stress EF: 54%. The left ventricular ejection fraction is mildly decreased (45-54%). There is no prior study for comparison.  Carotid U/S 08/27/15 [facility]): Requested.  PFTs 12/10/14: FVC 1.93 (71%), FEV1 1.47 (73%), ratio 104%, TLC 71%, DLCO 14.37 (53%) => moderate restriction concerning for interstitial lung disease.   08/25/15 6 min walk test 08/25/15: Pt ambulated 790 ft (240.8 m).    Renal U/S 07/31/15 [facility]; [facility]):  Normal sonographic appearance of the kidneys.  CXR 12/30/15: FINDINGS: Aeration of the lungs has improved slightly. However, there is indistinctness of the parenchymal vasculature with tiny effusions most consistent with persistent mild CHF. Changes of prior granulomatous disease are noted with a large granuloma at the right lung base and calcified right hilar lymph nodes as well. The  heart remains mildly enlarged. No acute bony abnormality is seen. IMPRESSION: Probable mild CHF with small effusions.  Stable cardiomegaly.  CT Chest High Resolution 12/25/14: IMPRESSION: 1. 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 No specific noncontrast CT evidence of chronic pulmonary thromboembolism (i.e. no pulmonary artery calcifications and no obvious dilated bronchial artery collaterals), although CTEPH cannot be excluded on the basis of this noncontrast study. Recommend correlation with V/Q scan. 2. Mild subpleural scarring associated with the wedge resection suture lines in the apical and lingular left upper lobe. No residual/recurrent pleural effusion. 3. Otherwise no evidence of interstitial lung disease. Specifically, no significant regions of subpleural reticulation, traction bronchiectasis or frank honeycombing. 4. Mild cardiomegaly.  Three-vessel coronary atherosclerosis.  Preoperative labs noted. H/H 11.1/35.1, PLT 286. Glucose 99. BUN 30. Cr 1.69 which is consistent with previous labs in [facility] from 08/06/15 (1.79) and 12/20/167 (1.67). 12/16/15 Urine culture ([facility]) showed 60,000 E. Coli.   Patient without acute cardiopulmonary issues at present. We did discuss that GETA is needed for this procedure and with her pulmonary hypertension history, there is a possibility that she may need to remain intubated after surgery. She understands that she should let the appropriate providers  know if there is a change in her status, as we would want her at her baseline line for surgery. In regards to her BLE rash.  I'm not sure of the etiology. She does have findings consistent with venous insufficiency, but she also describes a rather acute onset in the last week and worsening (redness and edema) since then. Both legs are effected, and she has been on Xarelto, so I would think  DVT is less likely. Because she feels her legs look "much worse" in one week and now showing some erythema concerning for cellulitis, particularly in the RLE, I advised that she should be reevaluated prior to surgery. Her WBC and PLT count are WNL. Her husband questioned a referral to dermatology, but I explained that a referral would have to come from her PCP. She and her husband were reluctant to go back in to the PCP, so I told them that I would contact Dr. Wende Neighborsrr's staff. They also wanted to know if Dr. Virginia Rochesterrr had any additional recommendations regarding her 12/16/15 urine culture (from what she said this was done following treatment for UTI). When I called and spoke with staff, I was told patient was actually being seen at their walk-in clinic today. I asked staff to follow-up with urine culture recommendations as well.   I have updated anesthesiologist Dr. Michelle Piperssey regarding above. He recommended holding Mestinon on the morning of surgery. Chart will be left for follow-up regarding today's clinic visit and this years' carotid U/S.   Velna Ochsllison Zelenak, PA-C [facility]iology Phone [phone] 12/30/2015 3:20 PM  Addendum: I called and spoke with Demetrios IsaacsZackary Rogers, [facility]-C [facility]. He saw patient yesterday. He started patient on Bactrim and ordered ABIs and [facility] titer. Reportedly ABIs okay. He is still waiting on RMSF titer result. If results okay, he anticipates she will ultimately need a dermatology referral. (I did discuss that by hospitalization notes in July 2017, that patient had recently been on Bactrim for RUE cellulitis when she developed acute on chronic renal failure with Cr 3.18. He will review and let patient know if any medication adjustments.)   Carotid U/S 08/27/15 (PCP office): Impressions: 1. Mild 1-39% stenosis of the right and left internal carotid arteries. 2. Antegrade blood flow through both vertebral  arteries.  Patient notified to hold mestinon on the morning of surgery.    I left a message with Shanda BumpsJessica at Dr. Mervyn Gayitty's office regarding BLE rash events as he may want to follow-up with patient prior to surgery to ensure rash/cellulitis is improved. (Update 01/01/16 10:15AM: RMSF titer was non-reactive. Shanda BumpsJessica to follow-up with patient prior to surgery.)  Velna Ochsllison Zelenak, PA-C [facility]Anest[facility]iology Phone [phone] 12/31/2015 3:55 PM

## 2016-01-07 ENCOUNTER — Encounter (HOSPITAL_COMMUNITY): Payer: Self-pay | Admitting: Anesthesiology

## 2016-01-07 NOTE — Anesthesia Preprocedure Evaluation (Addendum)
Anesthesia Evaluation  Patient identified by MRN, date of birth, ID band Patient awake    Reviewed: Allergy & Precautions, NPO status , Patient's Chart, lab work & pertinent test results  Airway Mallampati: III       Dental  (+) Upper Dentures, Partial Lower   Pulmonary shortness of breath,    Pulmonary exam normal breath sounds clear to auscultation       Cardiovascular hypertension, Pt. on medications + CAD, + Peripheral Vascular Disease and +CHF  Normal cardiovascular exam+ dysrhythmias Atrial Fibrillation + Valvular Problems/Murmurs AI  Rhythm:Irregular Rate:Normal + Systolic murmurs Pulmonary HTN Hx/o Chronic Atrial Fibrillation   Neuro/Psych  Headaches, Anxiety Hx/o retinopathy  Neuromuscular disease    GI/Hepatic   Endo/Other    Renal/GU Renal InsufficiencyRenal disease  negative genitourinary   Musculoskeletal  (+) Arthritis , Osteoarthritis,  Spondylolisthesis Lumbosacral   Abdominal   Peds  Hematology  (+) anemia , On Xarelto- last dose 01/04/2016   Anesthesia Other Findings   Reproductive/Obstetrics                             Chemistry      Component Value Date/Time   NA 138 12/30/2015 1052   K 4.3 12/30/2015 1052   CL 102 12/30/2015 1052   CO2 26 12/30/2015 1052   BUN 30 (H) 12/30/2015 1052   CREATININE 1.69 (H) 12/30/2015 1052      Component Value Date/Time   CALCIUM 9.1 12/30/2015 1052   ALKPHOS 114 08/09/2012 0916   AST 17 08/09/2012 0916   ALT 11 08/09/2012 0916   BILITOT 0.5 08/09/2012 0916     Lab Results  Component Value Date   WBC 6.4 12/30/2015   HGB 11.1 (L) 12/30/2015   HCT 35.1 (L) 12/30/2015   MCV 93.6 12/30/2015   PLT 286 12/30/2015   EKG 07/22/15: atrial fibrillation, rate 70, LAFB. Echo 07/23/15:- Left ventricle: The cavity size was normal. Wall thickness was   normal. Systolic function was normal. The estimated ejection   fraction was in the  range of 60% to 65%. Wall motion was normal;   there were no regional wall motion abnormalities. The study is   not technically sufficient to allow evaluation of LV diastolic   function. - Aortic valve: Sclerosis without stenosis. There was mild   regurgitation. - Mitral valve: Posterior calcified annulus. Mildly thickened   leaflets . There was mild regurgitation. - Left atrium: The atrium was normal in size. - Right ventricle: The cavity size was mildly dilated. Systolic   function is reduced. - Right atrium: The atrium was mildly dilated. - Tricuspid valve: There was mild regurgitation. - Pulmonary arteries: PA peak pressure: 36 mm Hg (S). - Inferior vena cava: The vessel was normal in size. The   respirophasic diameter changes were in the normal range (>= 50%),   consistent with normal central venous pressure.  Anesthesia Physical Anesthesia Plan  ASA: III  Anesthesia Plan: General   Post-op Pain Management:    Induction: Intravenous  Airway Management Planned: Oral ETT  Additional Equipment: Arterial line  Intra-op Plan:   Post-operative Plan: Extubation in OR  Informed Consent: I have reviewed the patients History and Physical, chart, labs and discussed the procedure including the risks, benefits and alternatives for the proposed anesthesia with the patient or authorized representative who has indicated his/her understanding and acceptance.   Dental advisory given  Plan Discussed with: Anesthesiologist, CRNA and Surgeon  Anesthesia Plan Comments:        Anesthesia Quick Evaluation

## 2016-01-08 ENCOUNTER — Encounter (HOSPITAL_COMMUNITY): Payer: Self-pay | Admitting: Certified Registered Nurse Anesthetist

## 2016-01-08 ENCOUNTER — Encounter (HOSPITAL_COMMUNITY)
Admission: RE | Disposition: A | Discharge status: Home / Self Care | Payer: Self-pay | Source: Ambulatory Visit | Attending: Neurological Surgery

## 2016-01-08 ENCOUNTER — Inpatient Hospital Stay (HOSPITAL_COMMUNITY): Payer: Medicare Other | Admitting: Anesthesiology

## 2016-01-08 ENCOUNTER — Inpatient Hospital Stay (HOSPITAL_COMMUNITY): Payer: Medicare Other | Admitting: Vascular Surgery

## 2016-01-08 ENCOUNTER — Inpatient Hospital Stay (HOSPITAL_COMMUNITY)
Admission: RE | Admit: 2016-01-08 | Discharge: 2016-01-11 | DRG: 460 | Disposition: A | Discharge status: Home Health | Payer: Medicare Other | Source: Ambulatory Visit | Attending: Neurological Surgery | Admitting: Neurological Surgery

## 2016-01-08 ENCOUNTER — Inpatient Hospital Stay (HOSPITAL_COMMUNITY): Payer: Medicare Other

## 2016-01-08 DIAGNOSIS — Z888 Allergy status to other drugs, medicaments and biological substances status: Secondary | ICD-10-CM

## 2016-01-08 DIAGNOSIS — Z96653 Presence of artificial knee joint, bilateral: Secondary | ICD-10-CM | POA: Diagnosis present

## 2016-01-08 DIAGNOSIS — I272 Pulmonary hypertension, unspecified: Secondary | ICD-10-CM | POA: Diagnosis present

## 2016-01-08 DIAGNOSIS — I4891 Unspecified atrial fibrillation: Secondary | ICD-10-CM | POA: Diagnosis present

## 2016-01-08 DIAGNOSIS — I509 Heart failure, unspecified: Secondary | ICD-10-CM | POA: Diagnosis present

## 2016-01-08 DIAGNOSIS — Z96649 Presence of unspecified artificial hip joint: Secondary | ICD-10-CM | POA: Diagnosis present

## 2016-01-08 DIAGNOSIS — Z90710 Acquired absence of both cervix and uterus: Secondary | ICD-10-CM | POA: Diagnosis not present

## 2016-01-08 DIAGNOSIS — M4316 Spondylolisthesis, lumbar region: Secondary | ICD-10-CM | POA: Diagnosis present

## 2016-01-08 DIAGNOSIS — Z79899 Other long term (current) drug therapy: Secondary | ICD-10-CM | POA: Diagnosis not present

## 2016-01-08 DIAGNOSIS — I110 Hypertensive heart disease with heart failure: Secondary | ICD-10-CM | POA: Diagnosis present

## 2016-01-08 DIAGNOSIS — M5416 Radiculopathy, lumbar region: Secondary | ICD-10-CM | POA: Diagnosis present

## 2016-01-08 DIAGNOSIS — R2681 Unsteadiness on feet: Secondary | ICD-10-CM

## 2016-01-08 DIAGNOSIS — Z885 Allergy status to narcotic agent status: Secondary | ICD-10-CM

## 2016-01-08 DIAGNOSIS — I739 Peripheral vascular disease, unspecified: Secondary | ICD-10-CM | POA: Diagnosis present

## 2016-01-08 DIAGNOSIS — Z419 Encounter for procedure for purposes other than remedying health state, unspecified: Secondary | ICD-10-CM

## 2016-01-08 DIAGNOSIS — I2510 Atherosclerotic heart disease of native coronary artery without angina pectoris: Secondary | ICD-10-CM | POA: Diagnosis present

## 2016-01-08 DIAGNOSIS — Z7901 Long term (current) use of anticoagulants: Secondary | ICD-10-CM | POA: Diagnosis not present

## 2016-01-08 DIAGNOSIS — Z9889 Other specified postprocedural states: Secondary | ICD-10-CM

## 2016-01-08 DIAGNOSIS — M1990 Unspecified osteoarthritis, unspecified site: Secondary | ICD-10-CM | POA: Diagnosis present

## 2016-01-08 DIAGNOSIS — I482 Chronic atrial fibrillation: Secondary | ICD-10-CM | POA: Diagnosis present

## 2016-01-08 HISTORY — PX: MAXIMUM ACCESS (MAS)POSTERIOR LUMBAR INTERBODY FUSION (PLIF) 1 LEVEL: SHX6368

## 2016-01-08 LAB — CBC
HCT: 31 % — ABNORMAL LOW (ref 36.0–46.0)
Hemoglobin: 9.9 g/dL — ABNORMAL LOW (ref 12.0–15.0)
MCH: 29.5 pg (ref 26.0–34.0)
MCHC: 31.9 g/dL (ref 30.0–36.0)
MCV: 92.3 fL (ref 78.0–100.0)
PLATELETS: 292 10*3/uL (ref 150–400)
RBC: 3.36 MIL/uL — ABNORMAL LOW (ref 3.87–5.11)
RDW: 15.8 % — AB (ref 11.5–15.5)
WBC: 9.8 10*3/uL (ref 4.0–10.5)

## 2016-01-08 LAB — CREATININE, SERUM
CREATININE: 1.42 mg/dL — AB (ref 0.44–1.00)
GFR, EST AFRICAN AMERICAN: 38 mL/min — AB (ref >60)
GFR, EST NON AFRICAN AMERICAN: 33 mL/min — AB (ref >60)

## 2016-01-08 LAB — PROTIME-INR
INR: 1.19
PROTHROMBIN TIME: 15.2 s (ref 11.4–15.2)

## 2016-01-08 SURGERY — FOR MAXIMUM ACCESS (MAS) POSTERIOR LUMBAR INTERBODY FUSION (PLIF) 1 LEVEL
Anesthesia: General | Site: Spine Lumbar | Laterality: Bilateral

## 2016-01-08 MED ORDER — PHENOL 1.4 % MT LIQD: 1.0000 | OROMUCOSAL | Status: DC | PRN

## 2016-01-08 MED ORDER — CEFAZOLIN SODIUM 1 G IJ SOLR
INTRAMUSCULAR | Status: AC
  Filled 2016-01-08: qty 20

## 2016-01-08 MED ORDER — VANCOMYCIN HCL 1000 MG IV SOLR
INTRAVENOUS | Status: AC
Start: 2016-01-08 — End: 2016-01-08
  Filled 2016-01-08: qty 1000

## 2016-01-08 MED ORDER — SURGIFOAM 100 EX MISC
CUTANEOUS | Status: DC | PRN
  Administered 2016-01-08: 10 mL via TOPICAL

## 2016-01-08 MED ORDER — 0.9 % SODIUM CHLORIDE (POUR BTL) OPTIME
TOPICAL | Status: DC | PRN
  Administered 2016-01-08: 1000 mL

## 2016-01-08 MED ORDER — BUPIVACAINE HCL (PF) 0.25 % IJ SOLN
INTRAMUSCULAR | Status: AC
  Filled 2016-01-08: qty 30

## 2016-01-08 MED ORDER — CHLORHEXIDINE GLUCONATE CLOTH 2 % EX PADS: 6.0000 | MEDICATED_PAD | Freq: Once | CUTANEOUS | Status: DC

## 2016-01-08 MED ORDER — ONDANSETRON HCL 4 MG/2ML IJ SOLN: 4.0000 mg | INTRAMUSCULAR | Status: DC | PRN

## 2016-01-08 MED ORDER — BUPIVACAINE HCL 0.5 % IJ SOLN
INTRAMUSCULAR | Status: DC | PRN
  Administered 2016-01-08: 15 mL

## 2016-01-08 MED ORDER — VANCOMYCIN HCL 1000 MG IV SOLR
INTRAVENOUS | Status: DC | PRN
  Administered 2016-01-08: 1000 mg via TOPICAL

## 2016-01-08 MED ORDER — PHENYLEPHRINE 40 MCG/ML (10ML) SYRINGE FOR IV PUSH (FOR BLOOD PRESSURE SUPPORT)
PREFILLED_SYRINGE | INTRAVENOUS | Status: AC
  Filled 2016-01-08: qty 10

## 2016-01-08 MED ORDER — ALUM & MAG HYDROXIDE-SIMETH 200-200-20 MG/5ML PO SUSP: 30.0000 mL | Freq: Four times a day (QID) | ORAL | Status: DC | PRN

## 2016-01-08 MED ORDER — DEXAMETHASONE SODIUM PHOSPHATE 10 MG/ML IJ SOLN
INTRAMUSCULAR | Status: DC | PRN
  Administered 2016-01-08: 10 mg via INTRAVENOUS

## 2016-01-08 MED ORDER — CEFAZOLIN IN D5W 1 GM/50ML IV SOLN
1.0000 g | Freq: Three times a day (TID) | INTRAVENOUS | Status: AC
  Administered 2016-01-08 – 2016-01-09 (×2): 1 g via INTRAVENOUS
  Filled 2016-01-08 (×2): qty 50

## 2016-01-08 MED ORDER — BUPIVACAINE HCL (PF) 0.5 % IJ SOLN
INTRAMUSCULAR | Status: AC
Start: 2016-01-08 — End: 2016-01-08
  Filled 2016-01-08: qty 30

## 2016-01-08 MED ORDER — KETOROLAC TROMETHAMINE 30 MG/ML IJ SOLN
INTRAMUSCULAR | Status: DC | PRN
  Administered 2016-01-08: 30 mg

## 2016-01-08 MED ORDER — SODIUM CHLORIDE 0.9 % IJ SOLN
INTRAMUSCULAR | Status: DC | PRN
  Administered 2016-01-08: 10 mL

## 2016-01-08 MED ORDER — ENOXAPARIN SODIUM 40 MG/0.4ML ~~LOC~~ SOLN
40.0000 mg | SUBCUTANEOUS | Status: DC
  Administered 2016-01-09 – 2016-01-11 (×3): 40 mg via SUBCUTANEOUS
  Filled 2016-01-08 (×3): qty 0.4

## 2016-01-08 MED ORDER — ONDANSETRON HCL 4 MG/2ML IJ SOLN
INTRAMUSCULAR | Status: AC
  Filled 2016-01-08: qty 2

## 2016-01-08 MED ORDER — PANTOPRAZOLE SODIUM 40 MG IV SOLR
40.0000 mg | Freq: Every day | INTRAVENOUS | Status: DC
  Administered 2016-01-08 – 2016-01-10 (×3): 40 mg via INTRAVENOUS
  Filled 2016-01-08 (×3): qty 40

## 2016-01-08 MED ORDER — POTASSIUM CHLORIDE CRYS ER 20 MEQ PO TBCR
20.0000 meq | EXTENDED_RELEASE_TABLET | Freq: Two times a day (BID) | ORAL | Status: DC
  Administered 2016-01-08 – 2016-01-11 (×6): 20 meq via ORAL
  Filled 2016-01-08 (×6): qty 1

## 2016-01-08 MED ORDER — GABAPENTIN 300 MG PO CAPS
900.0000 mg | ORAL_CAPSULE | Freq: Three times a day (TID) | ORAL | Status: DC
  Administered 2016-01-08 – 2016-01-11 (×9): 900 mg via ORAL
  Filled 2016-01-08 (×9): qty 3

## 2016-01-08 MED ORDER — LIDOCAINE-EPINEPHRINE (PF) 2 %-1:200000 IJ SOLN
INTRAMUSCULAR | Status: AC
Start: 2016-01-08 — End: 2016-01-08
  Filled 2016-01-08: qty 20

## 2016-01-08 MED ORDER — TORSEMIDE 20 MG PO TABS: 20.0000 mg | ORAL_TABLET | Freq: Two times a day (BID) | ORAL | Status: DC

## 2016-01-08 MED ORDER — FENTANYL CITRATE (PF) 100 MCG/2ML IJ SOLN
INTRAMUSCULAR | Status: AC
  Filled 2016-01-08: qty 2

## 2016-01-08 MED ORDER — FLEET ENEMA 7-19 GM/118ML RE ENEM: 1.0000 | ENEMA | Freq: Once | RECTAL | Status: DC | PRN

## 2016-01-08 MED ORDER — FENTANYL CITRATE (PF) 100 MCG/2ML IJ SOLN
INTRAMUSCULAR | Status: DC | PRN
  Administered 2016-01-08 (×4): 50 ug via INTRAVENOUS
  Administered 2016-01-08: 100 ug via INTRAVENOUS

## 2016-01-08 MED ORDER — EPINEPHRINE PF 1 MG/ML IJ SOLN
INTRAMUSCULAR | Status: AC
  Filled 2016-01-08: qty 3

## 2016-01-08 MED ORDER — THROMBIN 20000 UNITS EX SOLR
CUTANEOUS | Status: AC
  Filled 2016-01-08: qty 20000

## 2016-01-08 MED ORDER — ARTIFICIAL TEARS OP OINT
TOPICAL_OINTMENT | OPHTHALMIC | Status: AC
  Filled 2016-01-08: qty 3.5

## 2016-01-08 MED ORDER — METHOCARBAMOL 750 MG PO TABS
750.0000 mg | ORAL_TABLET | Freq: Four times a day (QID) | ORAL | Status: DC
  Administered 2016-01-08 – 2016-01-10 (×9): 750 mg via ORAL
  Filled 2016-01-08 (×11): qty 1

## 2016-01-08 MED ORDER — LACTATED RINGERS IV SOLN
INTRAVENOUS | Status: DC | PRN
  Administered 2016-01-08: 08:00:00 via INTRAVENOUS

## 2016-01-08 MED ORDER — PHENYLEPHRINE HCL 10 MG/ML IJ SOLN
INTRAMUSCULAR | Status: DC | PRN
  Administered 2016-01-08: 40 ug via INTRAVENOUS

## 2016-01-08 MED ORDER — CEFAZOLIN SODIUM-DEXTROSE 2-4 GM/100ML-% IV SOLN
2.0000 g | INTRAVENOUS | Status: AC
  Administered 2016-01-08 (×2): 2 g via INTRAVENOUS
  Filled 2016-01-08: qty 100

## 2016-01-08 MED ORDER — BUPIVACAINE LIPOSOME 1.3 % IJ SUSP
20.0000 mL | INTRAMUSCULAR | Status: DC
  Filled 2016-01-08: qty 20

## 2016-01-08 MED ORDER — BUPIVACAINE LIPOSOME 1.3 % IJ SUSP
INTRAMUSCULAR | Status: DC | PRN
Start: 2016-01-08 — End: 2016-01-08
  Administered 2016-01-08: 20 mL

## 2016-01-08 MED ORDER — SODIUM CHLORIDE 0.9 % IV SOLN: 250.0000 mL | INTRAVENOUS | Status: DC

## 2016-01-08 MED ORDER — POTASSIUM CHLORIDE IN NACL 20-0.9 MEQ/L-% IV SOLN
100.0000 mL/h | INTRAVENOUS | Status: DC
  Administered 2016-01-09 (×2): 100 mL/h via INTRAVENOUS
  Filled 2016-01-08 (×3): qty 1000

## 2016-01-08 MED ORDER — DEXAMETHASONE SODIUM PHOSPHATE 10 MG/ML IJ SOLN
INTRAMUSCULAR | Status: AC
  Filled 2016-01-08: qty 1

## 2016-01-08 MED ORDER — METOPROLOL SUCCINATE ER 25 MG PO TB24
25.0000 mg | ORAL_TABLET | Freq: Every day | ORAL | Status: DC
  Administered 2016-01-09 – 2016-01-11 (×3): 25 mg via ORAL
  Filled 2016-01-08 (×5): qty 1

## 2016-01-08 MED ORDER — BUPIVACAINE HCL 0.25 % IJ SOLN
INTRAMUSCULAR | Status: DC | PRN
  Administered 2016-01-08: 1 mL

## 2016-01-08 MED ORDER — METHYLPREDNISOLONE ACETATE PF 80 MG/ML IJ SUSP
INTRAMUSCULAR | Status: DC | PRN
  Administered 2016-01-08: 80 mg

## 2016-01-08 MED ORDER — THROMBIN 5000 UNITS EX SOLR
CUTANEOUS | Status: AC
  Filled 2016-01-08: qty 5000

## 2016-01-08 MED ORDER — SODIUM CHLORIDE 0.9% FLUSH: 3.0000 mL | INTRAVENOUS | Status: DC | PRN

## 2016-01-08 MED ORDER — METHYLPREDNISOLONE ACETATE 80 MG/ML IJ SUSP
INTRAMUSCULAR | Status: AC
  Filled 2016-01-08: qty 1

## 2016-01-08 MED ORDER — PROPOFOL 10 MG/ML IV BOLUS
INTRAVENOUS | Status: AC
  Filled 2016-01-08: qty 20

## 2016-01-08 MED ORDER — SILDENAFIL CITRATE 20 MG PO TABS
40.0000 mg | ORAL_TABLET | Freq: Three times a day (TID) | ORAL | Status: DC
  Administered 2016-01-08 – 2016-01-11 (×9): 40 mg via ORAL
  Filled 2016-01-08 (×11): qty 2

## 2016-01-08 MED ORDER — PHENYLEPHRINE HCL 10 MG/ML IJ SOLN
INTRAVENOUS | Status: DC | PRN
  Administered 2016-01-08: 10 ug/min via INTRAVENOUS

## 2016-01-08 MED ORDER — SELEXIPAG 1000 MCG PO TABS: 1.0000 | ORAL_TABLET | Freq: Two times a day (BID) | ORAL | Status: DC

## 2016-01-08 MED ORDER — THROMBIN 5000 UNITS EX SOLR
OROMUCOSAL | Status: DC | PRN
  Administered 2016-01-08 (×3): 5 mL via TOPICAL

## 2016-01-08 MED ORDER — ONDANSETRON HCL 4 MG/2ML IJ SOLN: 4.0000 mg | Freq: Once | INTRAMUSCULAR | Status: DC | PRN

## 2016-01-08 MED ORDER — PYRIDOSTIGMINE BROMIDE 60 MG PO TABS
30.0000 mg | ORAL_TABLET | Freq: Three times a day (TID) | ORAL | Status: DC
  Administered 2016-01-08 – 2016-01-11 (×9): 30 mg via ORAL
  Filled 2016-01-08 (×11): qty 0.5

## 2016-01-08 MED ORDER — ONDANSETRON HCL 4 MG/2ML IJ SOLN
INTRAMUSCULAR | Status: DC | PRN
  Administered 2016-01-08: 4 mg via INTRAVENOUS

## 2016-01-08 MED ORDER — LIDOCAINE HCL (CARDIAC) 20 MG/ML IV SOLN
INTRAVENOUS | Status: DC | PRN
  Administered 2016-01-08: 60 mg via INTRAVENOUS

## 2016-01-08 MED ORDER — ARTIFICIAL TEARS OP OINT
TOPICAL_OINTMENT | OPHTHALMIC | Status: DC | PRN
  Administered 2016-01-08: 1 via OPHTHALMIC

## 2016-01-08 MED ORDER — SENNA 8.6 MG PO TABS
1.0000 | ORAL_TABLET | Freq: Two times a day (BID) | ORAL | Status: DC
  Administered 2016-01-08 – 2016-01-11 (×6): 8.6 mg via ORAL
  Filled 2016-01-08 (×6): qty 1

## 2016-01-08 MED ORDER — LIDOCAINE-EPINEPHRINE (PF) 2 %-1:200000 IJ SOLN
INTRAMUSCULAR | Status: DC | PRN
  Administered 2016-01-08: 15 mL via INTRADERMAL

## 2016-01-08 MED ORDER — MENTHOL 3 MG MT LOZG: 1.0000 | LOZENGE | OROMUCOSAL | Status: DC | PRN

## 2016-01-08 MED ORDER — KETOROLAC TROMETHAMINE 30 MG/ML IJ SOLN
INTRAMUSCULAR | Status: AC
  Filled 2016-01-08: qty 1

## 2016-01-08 MED ORDER — MEPERIDINE HCL 25 MG/ML IJ SOLN: 6.2500 mg | INTRAMUSCULAR | Status: DC | PRN

## 2016-01-08 MED ORDER — LIDOCAINE HCL 2 % IJ SOLN
INTRAMUSCULAR | Status: AC
  Filled 2016-01-08: qty 20

## 2016-01-08 MED ORDER — SUCCINYLCHOLINE CHLORIDE 20 MG/ML IJ SOLN
INTRAMUSCULAR | Status: DC | PRN
  Administered 2016-01-08: 100 mg via INTRAVENOUS

## 2016-01-08 MED ORDER — TORSEMIDE 20 MG PO TABS
20.0000 mg | ORAL_TABLET | Freq: Every day | ORAL | Status: DC
  Administered 2016-01-08 – 2016-01-10 (×3): 20 mg via ORAL
  Filled 2016-01-08 (×3): qty 1

## 2016-01-08 MED ORDER — LACTATED RINGERS IV SOLN
INTRAVENOUS | Status: DC | PRN
  Administered 2016-01-08 (×2): via INTRAVENOUS

## 2016-01-08 MED ORDER — SODIUM CHLORIDE 0.9% FLUSH
3.0000 mL | Freq: Two times a day (BID) | INTRAVENOUS | Status: DC
  Administered 2016-01-08 – 2016-01-11 (×5): 3 mL via INTRAVENOUS

## 2016-01-08 MED ORDER — FENTANYL CITRATE (PF) 100 MCG/2ML IJ SOLN
INTRAMUSCULAR | Status: AC
  Filled 2016-01-08: qty 4

## 2016-01-08 MED ORDER — PROPOFOL 500 MG/50ML IV EMUL
INTRAVENOUS | Status: DC | PRN
Start: 2016-01-08 — End: 2016-01-08
  Administered 2016-01-08: 50 ug/kg/min via INTRAVENOUS

## 2016-01-08 MED ORDER — DIAZEPAM 5 MG PO TABS: 5.0000 mg | ORAL_TABLET | Freq: Three times a day (TID) | ORAL | Status: DC | PRN

## 2016-01-08 MED ORDER — PROPOFOL 10 MG/ML IV BOLUS
INTRAVENOUS | Status: DC | PRN
  Administered 2016-01-08: 100 mg via INTRAVENOUS

## 2016-01-08 MED ORDER — FERROUS SULFATE 325 (65 FE) MG PO TABS: 325.0000 mg | ORAL_TABLET | ORAL | Status: DC

## 2016-01-08 MED ORDER — FENTANYL CITRATE (PF) 100 MCG/2ML IJ SOLN
25.0000 ug | INTRAMUSCULAR | Status: DC | PRN
  Administered 2016-01-08: 25 ug via INTRAVENOUS

## 2016-01-08 MED ORDER — BISACODYL 10 MG RE SUPP: 10.0000 mg | Freq: Every day | RECTAL | Status: DC | PRN

## 2016-01-08 MED ORDER — CELECOXIB 200 MG PO CAPS
200.0000 mg | ORAL_CAPSULE | Freq: Two times a day (BID) | ORAL | Status: DC
  Administered 2016-01-08 – 2016-01-11 (×5): 200 mg via ORAL
  Filled 2016-01-08 (×6): qty 1

## 2016-01-08 MED ORDER — DOCUSATE SODIUM 100 MG PO CAPS
100.0000 mg | ORAL_CAPSULE | Freq: Two times a day (BID) | ORAL | Status: DC
  Administered 2016-01-08 – 2016-01-11 (×6): 100 mg via ORAL
  Filled 2016-01-08 (×7): qty 1

## 2016-01-08 MED ORDER — TORSEMIDE 20 MG PO TABS
40.0000 mg | ORAL_TABLET | Freq: Every day | ORAL | Status: DC
  Administered 2016-01-09 – 2016-01-11 (×3): 40 mg via ORAL
  Filled 2016-01-08 (×3): qty 2

## 2016-01-08 MED ORDER — PROPOFOL 1000 MG/100ML IV EMUL
INTRAVENOUS | Status: AC
  Filled 2016-01-08: qty 100

## 2016-01-08 MED ORDER — NAPROXEN SODIUM 275 MG PO TABS
275.0000 mg | ORAL_TABLET | Freq: Every day | ORAL | Status: DC | PRN
  Filled 2016-01-08: qty 1

## 2016-01-08 MED ORDER — SODIUM CHLORIDE 0.9 % IR SOLN
Status: DC | PRN
  Administered 2016-01-08: 500 mL

## 2016-01-08 MED FILL — Bupivacaine HCl Preservative Free (PF) Inj 0.25%: INTRAMUSCULAR | Qty: 30 | Status: AC

## 2016-01-08 MED FILL — Ketorolac Tromethamine Inj 30 MG/ML: INTRAMUSCULAR | Qty: 1 | Status: AC

## 2016-01-08 MED FILL — Methylprednisolone Acetate Inj Susp 80 MG/ML: INTRAMUSCULAR | Qty: 1 | Status: AC

## 2016-01-08 SURGICAL SUPPLY — 84 items
BAG DECANTER FOR FLEXI CONT (MISCELLANEOUS) ×3 IMPLANT
BENZOIN TINCTURE PRP APPL 2/3 (GAUZE/BANDAGES/DRESSINGS) ×3 IMPLANT
BIT DRILL NEURO 2X3.1 SFT TUCH (MISCELLANEOUS) ×1 IMPLANT
BIT DRILL PLIF MAS 5.0MM DISP (DRILL) ×1 IMPLANT
BLADE CLIPPER SURG (BLADE) IMPLANT
BLADE SURG 11 STRL SS (BLADE) ×3 IMPLANT
BUR MATCHSTICK NEURO 3.0 LAGG (BURR) ×3 IMPLANT
BUR ROUND FLUTED 5 RND (BURR) ×2 IMPLANT
BUR ROUND FLUTED 5MM RND (BURR) ×1
CAGE COROENT MP 10X9X23 8DEG (Cage) ×4 IMPLANT
CAGE COROENT MP 10X9X23MM 8DEG (Cage) ×2 IMPLANT
CANISTER SUCT 3000ML PPV (MISCELLANEOUS) ×6 IMPLANT
CARTRIDGE OIL MAESTRO DRILL (MISCELLANEOUS) ×1 IMPLANT
CHLORAPREP W/TINT 26ML (MISCELLANEOUS) ×3 IMPLANT
CLIP NEUROVISION LG (CLIP) ×3 IMPLANT
CONT SPEC 4OZ CLIKSEAL STRL BL (MISCELLANEOUS) ×3 IMPLANT
DECANTER SPIKE VIAL GLASS SM (MISCELLANEOUS) IMPLANT
DERMABOND ADVANCED (GAUZE/BANDAGES/DRESSINGS) ×2
DERMABOND ADVANCED .7 DNX12 (GAUZE/BANDAGES/DRESSINGS) ×1 IMPLANT
DIFFUSER DRILL AIR PNEUMATIC (MISCELLANEOUS) ×3 IMPLANT
DRAPE C-ARM 42X72 X-RAY (DRAPES) ×6 IMPLANT
DRAPE C-ARMOR (DRAPES) ×3 IMPLANT
DRAPE MICROSCOPE LEICA (MISCELLANEOUS) ×3 IMPLANT
DRAPE POUCH INSTRU U-SHP 10X18 (DRAPES) ×3 IMPLANT
DRAPE SURG 17X23 STRL (DRAPES) ×3 IMPLANT
DRILL NEURO 2X3.1 SOFT TOUCH (MISCELLANEOUS) ×3
DRILL PLIF MAS 5.0MM DISP (DRILL) ×3
DURAMATRIX ONLAY 2X2 (Neuro Prosthesis/Implant) ×3 IMPLANT
ELECT REM PT RETURN 9FT ADLT (ELECTROSURGICAL) ×3
ELECTRODE REM PT RTRN 9FT ADLT (ELECTROSURGICAL) ×1 IMPLANT
GAUZE SPONGE 4X4 12PLY STRL (GAUZE/BANDAGES/DRESSINGS) ×6 IMPLANT
GAUZE SPONGE 4X4 16PLY XRAY LF (GAUZE/BANDAGES/DRESSINGS) IMPLANT
GLOVE BIOGEL PI IND STRL 7.5 (GLOVE) ×2 IMPLANT
GLOVE BIOGEL PI INDICATOR 7.5 (GLOVE) ×4
GLOVE SS BIOGEL STRL SZ 7.5 (GLOVE) ×3 IMPLANT
GLOVE SUPERSENSE BIOGEL SZ 7.5 (GLOVE) ×6
GOWN STRL REUS W/ TWL LRG LVL3 (GOWN DISPOSABLE) ×1 IMPLANT
GOWN STRL REUS W/ TWL XL LVL3 (GOWN DISPOSABLE) ×2 IMPLANT
GOWN STRL REUS W/TWL LRG LVL3 (GOWN DISPOSABLE) ×2
GOWN STRL REUS W/TWL XL LVL3 (GOWN DISPOSABLE) ×4
HEMOSTAT POWDER KIT SURGIFOAM (HEMOSTASIS) IMPLANT
HEMOSTAT POWDER SURGIFOAM 1G (HEMOSTASIS) ×6 IMPLANT
KIT BASIN OR (CUSTOM PROCEDURE TRAY) ×3 IMPLANT
KIT INFUSE X SMALL 1.4CC (Orthopedic Implant) ×3 IMPLANT
KIT ROOM TURNOVER OR (KITS) ×3 IMPLANT
LIGHT SOURCE ANGLE TIP STR 7FT (MISCELLANEOUS) ×3 IMPLANT
MODULE NVM5 NEXT GEN EMG (NEEDLE) ×3 IMPLANT
NEEDLE HYPO 18GX1.5 BLUNT FILL (NEEDLE) ×3 IMPLANT
NEEDLE HYPO 21X1.5 SAFETY (NEEDLE) ×6 IMPLANT
NEEDLE HYPO 25X1 1.5 SAFETY (NEEDLE) IMPLANT
NS IRRIG 1000ML POUR BTL (IV SOLUTION) ×3 IMPLANT
OIL CARTRIDGE MAESTRO DRILL (MISCELLANEOUS) ×3
PACK LAMINECTOMY NEURO (CUSTOM PROCEDURE TRAY) ×3 IMPLANT
PACK UNIVERSAL I (CUSTOM PROCEDURE TRAY) ×3 IMPLANT
PAD ARMBOARD 7.5X6 YLW CONV (MISCELLANEOUS) ×3 IMPLANT
PATTIES SURGICAL .5X1.5 (GAUZE/BANDAGES/DRESSINGS) IMPLANT
PATTIES SURGICAL 1X1 (DISPOSABLE) ×3 IMPLANT
PUTTY BONE ATTRAX 5CC STRIP (Putty) ×3 IMPLANT
ROD PREBENT 45MM LUMBAR (Rod) ×6 IMPLANT
RUBBERBAND STERILE (MISCELLANEOUS) ×3 IMPLANT
SCREW LOCK (Screw) ×8 IMPLANT
SCREW LOCK FXNS SPNE MAS PL (Screw) ×4 IMPLANT
SCREW SHANK 5.0X35 (Screw) ×9 IMPLANT
SCREW SHANK 5.0X40MM (Screw) ×6 IMPLANT
SCREW TULIP 5.5 (Screw) ×12 IMPLANT
SEALANT ADHERUS EXTEND TIP (MISCELLANEOUS) ×3 IMPLANT
SPONGE NEURO XRAY DETECT 1X3 (DISPOSABLE) IMPLANT
SPONGE SURGIFOAM ABS GEL 100 (HEMOSTASIS) ×3 IMPLANT
SUT PROLENE 6 0 BV (SUTURE) ×3 IMPLANT
SUT STRATAFIX MNCRL+ 3-0 PS-2 (SUTURE) ×1
SUT STRATAFIX MONOCRYL 3-0 (SUTURE) ×2
SUT VIC AB 0 CT1 18XCR BRD8 (SUTURE) ×4 IMPLANT
SUT VIC AB 0 CT1 8-18 (SUTURE) ×8
SUT VIC AB 2-0 CT1 18 (SUTURE) ×9 IMPLANT
SUT VIC AB 3-0 SH 8-18 (SUTURE) ×3 IMPLANT
SUT VIC AB 4-0 PS2 27 (SUTURE) IMPLANT
SUTURE STRATFX MNCRL+ 3-0 PS-2 (SUTURE) ×1 IMPLANT
SYR 30ML LL (SYRINGE) ×6 IMPLANT
SYR 5ML LL (SYRINGE) ×3 IMPLANT
TOWEL OR 17X24 6PK STRL BLUE (TOWEL DISPOSABLE) ×3 IMPLANT
TOWEL OR 17X26 10 PK STRL BLUE (TOWEL DISPOSABLE) ×3 IMPLANT
TUBE CONNECTING 12'X1/4 (SUCTIONS) ×1
TUBE CONNECTING 12X1/4 (SUCTIONS) ×2 IMPLANT
WATER STERILE IRR 1000ML POUR (IV SOLUTION) IMPLANT

## 2016-01-08 NOTE — Transfer of Care (Signed)
Immediate Anesthesia Transfer of Care Note  Patient: Rachael KaplanBetty H Louison  Procedure(s) Performed: Procedure(s) with comments: L4-5 MAXIMUM ACCESS POSTERIOR LUMBAR INTERBODY FUSION WITH GILL LAMINECTOMIES, BILATERALLY WITH RIGHT L5-S1 MICRODISKECTOMY (Bilateral) - L4-5 MAXIMUM ACCESS POSTERIOR LUMBAR INTERBODY FUSION WITH GILL LAMINECTOMIES, BILATERALLY WITH RIGHT L5-S1 MICRODISKECTOMY  Patient Location: PACU  Anesthesia Type:General  Level of Consciousness: awake  Airway & Oxygen Therapy: Patient Spontanous Breathing and Patient connected to nasal cannula oxygen  Post-op Assessment: Report given to RN, Post -op Vital signs reviewed and stable and Patient moving all extremities X 4  Post vital signs: Reviewed and stable  Last Vitals:  Vitals:   01/08/16 0649 01/08/16 0653  BP: (!) 177/99   Pulse: 67   Resp: 20   Temp:  36.7 C    Last Pain:  Vitals:   01/08/16 0653  TempSrc: Oral         Complications: No apparent anesthesia complications

## 2016-01-08 NOTE — Progress Notes (Signed)
Pt admitted to unit from pacu; pt alert and verbally responsive; IV intact and transfusing. Back incision clean, dry and intact with dermabond. No active drainage or bleeding noted. Pt oriented to the unit and room; fall/safety precaution and prevention education completed; foley intact and unclamped; ace wrap intact to LUE with non pitting edema noted to fingers. Pt report sling tingling to BUE; able to MAE x4; VSS; HOB flat and pt educated to remain and keep HOB flat as ordered by MD. Call light within reach and bed alarm on. Pt remains on 2L oxygen. Will continue to closely monitor pt. Dionne BucyP. Amo Brenyn Petrey RN

## 2016-01-08 NOTE — Op Note (Signed)
01/08/2016  12:13 PM  PATIENT:  Rachael Myers  83 y.o. female  PRE-OPERATIVE DIAGNOSIS:  Lumbar spondylolisthesis (L4-5); herniated nucleus pulposis (right L5-S1); lumbar radiculopathy  POST-OPERATIVE DIAGNOSIS:  Same  PROCEDURE:  L4-5 posterior lumbar interbody fusion, pedicle screw fixation L4-5 utilizing cortical trajectory pedicle screws; right L5-S1 laminotomy for discectomy; use of operating microscope; use of BMP  SURGEON:  Hulan Saas, MD  ASSISTANTS: Tressie Stalker, MD  ANESTHESIA:   General  DRAINS: None   SPECIMEN:  None  INDICATION FOR PROCEDURE: 84 year old woman with mechanical back pain and lumbar radiculopathy.  She has had worsening pain despite conservative treatment.  I recommended the above operation. Patient understood the risks, benefits, and alternatives and potential outcomes and wished to proceed.  PROCEDURE DETAILS: After smooth induction of general endotracheal anesthesia the patient was turned prone on the [place] table. The skin of the lumbar area was clipped of hair and wiped out with alcohol. It was prepped and draped in the usual sterile fashion. The planned incision was injected with a mixture of lidocaine and Marcaine with epinephrine.  The skin was opened sharply and a subperiosteal dissection was performed to expose the lateral edges of the lamina of L4-5 bilaterally and right L5-S1. Using the high speed burr I thinned the inferior edge of the right L5 lamina and superior edge of the right S1 lamina.  I then resected this with Kerrison punches.  I then brought in the operating microscope to provide light and magnification.  Using microsurgical technique I elevated the ligamentum flavum and resected this. I then medialized the traversing S1 nerve root and identified a hard disc herniation which was tenting up the nerve root. I incised the annulus and resected the herniated disc material.  I then palpated the ventral surface and identified more  compressive material.  I further elevated and separated the thecal sac and was able to work the remaining fragment laterally.  I resected that and confirmed complete decompression.  I obtained hemostasis. I then turned my attention to L4-5.  A Nuvasive MAS PLIF retractor was inserted into the field and opened over the facets.  Subperiosteal dissection was performed over the L4-5 facet joints bilaterally.  Using AP and lateral fluoroscopy cortical trajectory screws were placed at L4 and L5. A wide decompression including superior facetectomy and partial inferior facetectomy was performed at L4-5 to decompress the thecal sac and nerve roots. When elevating the ligamentum flavum it was found to be adherent and grown into the dura at several points.  I attempted to separate this as carefully as possible but there were several small durotomies through which there was egress of CSF but no nerve root herniation.  The exiting L4 and traversing L5 nerve roots were identified and found to to be adequately decompressed at this point.  At L4-5 an annulotomy was made. Using sequentially larger disc shavers this space was expanded. Disc material was removed with shavers, curettes, and the bone was decorticated with a rasp. The interbody space was packed with a combination BMP, beta tricalcium phosphate, and locally harvested bone and two titanium spacers were inserted into the interbody space.  A lordotic rod was inserted and secured in the screw caps. Final tightening with a torque wrench was performed at all levels. Meticulous hemostasis was obtained. The wound was irrigated with bacitracin saline. A valsalva maneuver was performed which did not result in CSF egress.  I placed a piece of duramatrix over the thecal sac and then  coated it with Adherus.  I injected a combination of marcaine, toradol, and depomedrol in the epidural space at L4-5 and right L5-S1.  Exparel was injected into the paraspinous muscles.  About 1 g  of vancomycin powder was inserted into the wound. The wound was closed in routine anatomic layers. The skin was closed with a running subcuticular monocryl suture and then sealed with dermabond.   The patient was then returned to the supine position on the bed.  PATIENT DISPOSITION:  PACU - hemodynamically stable.   Delay start of Pharmacological VTE agent (>24hrs) due to surgical blood loss or risk of bleeding:  yes

## 2016-01-08 NOTE — Anesthesia Procedure Notes (Signed)
Procedure Name: Intubation Date/Time: 01/08/2016 7:48 AM Performed by: Rejeana Brock L Pre-anesthesia Checklist: Patient identified, Emergency Drugs available, Suction available and Patient being monitored Patient Re-evaluated:Patient Re-evaluated prior to inductionOxygen Delivery Method: Circle System Utilized Preoxygenation: Pre-oxygenation with 100% oxygen Intubation Type: IV induction Ventilation: Mask ventilation without difficulty Laryngoscope Size: Mac and 3 Grade View: Grade I Tube type: Oral Tube size: 7.0 mm Number of attempts: 1 Airway Equipment and Method: Stylet and Oral airway Placement Confirmation: ETT inserted through vocal cords under direct vision,  positive ETCO2 and breath sounds checked- equal and bilateral Secured at: 21 cm Tube secured with: Tape Dental Injury: Teeth and Oropharynx as per pre-operative assessment

## 2016-01-08 NOTE — H&P (Signed)
CC:  Back and leg pain  HPI: Rachael Myers is a 83 y.o. female with lumbar spondylolisthesis and herniated nucleus pulposis presents for lumbar fusion and discectomy.  She complains of back and leg pain worse since I saw her last.  Otherwise no changes.  PMH: Past Medical History:  Diagnosis Date  . A-fib ([place])   . CHF (congestive heart failure) ([place])   . Coronary artery disease   . Dyspnea   . History of echocardiogram    Echo 11/16:  EF 55-60%, no RWMA, MAC, mild LAE, normal RVF, mild RAE, PASP 66 mmHg, trivial eff  . History of lung biopsy    [facility] 01/2014  . History of nuclear stress test    Myoview 9/16:  EF 54%, normal study  . History of pneumonia   . History of small bowel obstruction    Managed conservatively  . Hypertension   . Hyponatremia   . Osteoarthritis   . Pulmonary hypertension     PSH: Past Surgical History:  Procedure Laterality Date  . CARDIAC CATHETERIZATION N/A 12/17/2014   Procedure: Right Heart Cath;  Surgeon: Larey Dresser, MD;  Location: [place] [facility];  Service: Cardiovascular;  Laterality: N/A;  . COLONOSCOPY    . RECTOCELE REPAIR    . TOTAL HIP ARTHROPLASTY    . TOTAL KNEE ARTHROPLASTY Bilateral   . VESICOVAGINAL FISTULA CLOSURE W/ TAH    . VIDEO ASSISTED THORACOSCOPY (VATS)/THOROCOTOMY      SH: Social History  Substance Use Topics  . Smoking status: Never Smoker  . Smokeless tobacco: Never Used  . Alcohol use No    MEDS: Prior to Admission medications   Medication Sig Start Date End Date Taking? Authorizing Provider  ferrous sulfate 325 (65 FE) MG tablet Take 325 mg by mouth every 7 (seven) days.    Yes Historical Provider, MD  gabapentin (NEURONTIN) 600 MG tablet Take 1 tablet (600 mg total) by mouth 3 (three) times daily. 11/17/15  Yes Britt Bottom, MD  metoprolol succinate (TOPROL-XL) 25 MG 24 hr tablet Take 1 tablet (25 mg total) by mouth daily. 05/09/15  Yes Larey Dresser, MD  Potassium Chloride ER 20 MEQ TBCR Take  20 mEq by mouth 2 (two) times daily. 05/09/15  Yes Larey Dresser, MD  pyridostigmine (MESTINON) 60 MG tablet Take 0.5 tablets (30 mg total) by mouth 3 (three) times daily. 08/25/15  Yes Larey Dresser, MD  Rivaroxaban (XARELTO) 15 MG TABS tablet Take 15 mg by mouth daily with supper. Will stop 01-05-2016   Yes Historical Provider, MD  Selexipag (UPTRAVI) 1000 MCG TABS Take 1 tablet by mouth 2 (two) times daily. 12/11/15  Yes Larey Dresser, MD  sildenafil (REVATIO) 20 MG tablet Take 2 tablets (40 mg total) by mouth 3 (three) times daily. 11/20/15  Yes Larey Dresser, MD  torsemide (DEMADEX) 20 MG tablet Take 1-2 tablets (20-40 mg total) by mouth 2 (two) times daily. Two in the AM and one in the PM 05/12/15  Yes Larey Dresser, MD  naproxen sodium (ANAPROX) 220 MG tablet Take 220 mg by mouth daily as needed (pain).    Historical Provider, MD    ALLERGY: Allergies  Allergen Reactions  . Acetaminophen Hives, Swelling and Rash    LIPS SWELL  . Hydrocodone-Acetaminophen Hives, Swelling and Rash    LIPS SWELL  . Letairis [Ambrisentan] Swelling    "SIGNIFICANT" FACIAL and EYE SWELLING   . Opsumit [Macitentan] Other (See Comments)  DELUSIONS HALLUCINATIONS MOUTH SORES  . Ace Inhibitors Cough  . Atenolol Other (See Comments)    FATIGUE  . Hctz [Hydrochlorothiazide] Other (See Comments)    HYPONATREMIA, REACTION NOT AN ALLERGY   . Oxycodone Hives  . Diltiazem Hcl Swelling    SWELLING REACTION UNSPECIFIED     ROS: ROS  NEUROLOGIC EXAM: Awake, alert, oriented Memory and concentration grossly intact Speech fluent, appropriate CN grossly intact Motor exam: Upper Extremities Deltoid Bicep Tricep Grip  Right 5/5 5/5 5/5 5/5  Left 5/5 5/5 5/5 5/5   Lower Extremity IP Quad PF DF EHL  Right 5/5 5/5 5/5 5/5 5/5  Left 5/5 5/5 5/5 5/5 5/5   Sensation grossly intact to LT  IMAGING: No new imaging  IMPRESSION: - 83 y.o. female with lumbar spondylolisthesis and herniated nucleus  pulposis with mechanical back pain and lumbar radiculopathy.  PLAN: - L4-5 PLIF, right L5-S1 discectomy - We have had a long discussion about the risks and benefits of surgery as well as alternatives and she wishes to proceed.

## 2016-01-08 NOTE — Anesthesia Postprocedure Evaluation (Signed)
Anesthesia Post Note  Patient: Derwood KaplanBetty H Hagey  Procedure(s) Performed: Procedure(s) (LRB): L4-5 MAXIMUM ACCESS POSTERIOR LUMBAR INTERBODY FUSION WITH GILL LAMINECTOMIES, BILATERALLY WITH RIGHT L5-S1 MICRODISKECTOMY (Bilateral)  Patient location during evaluation: PACU Anesthesia Type: General Level of consciousness: awake and alert and oriented Pain management: pain level controlled Vital Signs Assessment: post-procedure vital signs reviewed and stable Respiratory status: spontaneous breathing, nonlabored ventilation, respiratory function stable and patient connected to nasal cannula oxygen Cardiovascular status: blood pressure returned to baseline and stable Postop Assessment: no signs of nausea or vomiting Anesthetic complications: no        Last Vitals:  Vitals:   01/08/16 1400 01/08/16 1415  BP: (!) 168/90 (!) 166/88  Pulse:    Resp:    Temp:      Last Pain:  Vitals:   01/08/16 1300  TempSrc:   PainSc: 0-No pain   Pain Goal:                 Laurann Mcmorris A.

## 2016-01-09 ENCOUNTER — Encounter (HOSPITAL_COMMUNITY): Payer: Self-pay | Admitting: Neurological Surgery

## 2016-01-09 NOTE — Evaluation (Signed)
Physical Therapy Evaluation Patient Details Name: Rachael Myers MRN: [id number] DOB: 03-01-1929 Today's Date: 01/09/2016   History of Present Illness  Patient is an 83 y/o female admitted with lumbar spondylolisthesis and herniated nucleus pulposis presents for lumbar fusion and discectomy.  Now s/p L4-5 PLIF and R L5-S1 lamy for discectomy.  Clinical Impression  Patient presents with decreased independence following above procedure.  Does report some history of falling and on some medication that helped, but not taking now.  Did note posterior bias with standing balance initially and one episode of R LE buckling prior to sitting.  Feel continued skilled PT needed during acute stay prior to d/c and will need HHPT at d/c as well.  Spouse able to assist, but has some mild balance issues himself.  Will continue assessment.     Follow Up Recommendations Home health PT;Supervision/Assistance - 24 hour    Equipment Recommendations  None recommended by PT    Recommendations for Other Services       Precautions / Restrictions Precautions Precautions: Fall;Back Precaution Booklet Issued: Yes (comment) Required Braces or Orthoses: Spinal Brace Spinal Brace: Lumbar corset;Applied in standing position (no brace initially, [employer] in to fit prior to sitting)      Mobility  Bed Mobility Overal bed mobility: Needs Assistance Bed Mobility: Rolling;Sidelying to Sit Rolling: Min guard Sidelying to sit: Min assist       General bed mobility comments: increased time, cues for technique and use of rail, assist for lifting trunk  Transfers Overall transfer level: Needs assistance Equipment used: Rolling walker (2 wheeled) Transfers: Sit to/from Stand Sit to Stand: Mod assist         General transfer comment: lifting help and pt leaning posteriorly initially; cues for hand placement  Ambulation/Gait Ambulation/Gait assistance: Mod assist;Min assist Ambulation Distance (Feet): 15  Feet Assistive device: Rolling walker (2 wheeled) Gait Pattern/deviations: Step-through pattern;Decreased stride length;Leaning posteriorly     General Gait Details: initially leaning posteriorly with mod support and cues, help with walker positioning to encourage anterior weight shift; then imiproved with time; stood for brace fitting when [employer] arrived, then as backing up to chair, R LE buckled and mod support to prevent LOB, assist and cues to correct and continue to back up and sit with UE support  Stairs            Wheelchair Mobility    Modified Rankin (Stroke Patients Only)       Balance Overall balance assessment: Needs assistance   Sitting balance-Leahy Scale: Good   Postural control: Posterior lean Standing balance support: Bilateral upper extremity supported Standing balance-Leahy Scale: Poor Standing balance comment: min to mod support with UE support for balance, initially leaning back, then improved with increased time upright                             Pertinent Vitals/Pain Pain Assessment: No/denies pain    Home Living Family/patient expects to be discharged to:: Private residence Living Arrangements: Spouse/significant other Available Help at Discharge: Family;Available 24 hours/day Type of Home: House Home Access: Stairs to enter   [employer] CorporationEntrance Stairs-Number of Steps: 1 Home Layout: One level Home Equipment: Shower seat;Grab bars - tub/shower;Hand held shower head;None;Walker - 2 wheels;Walker - 4 wheels      Prior Function Level of Independence: Independent         Comments: drives, does chores, etc     Hand Dominance   Dominant Hand:  Right    Extremity/Trunk Assessment   Upper Extremity Assessment Upper Extremity Assessment: Overall WFL for tasks assessed    Lower Extremity Assessment Lower Extremity Assessment: RLE deficits/detail;LLE deficits/detail RLE Deficits / Details: AROM WFL, strength hip flexion 3+/5, knee  extension 4/5, ankle DF 4+/5 LLE Deficits / Details: AROM WFL, strength hip flexion 3+/5, knee extension 4+/5, ankle DF 4-/5       Communication   Communication: No difficulties  Cognition Arousal/Alertness: Awake/alert Behavior During Therapy: WFL for tasks assessed/performed Overall Cognitive Status: Within Functional Limits for tasks assessed                      General Comments General comments (skin integrity, edema, etc.): Spouse in room and assisting, but note some mild balance deficits with him initially standing from low surface of couch and sat back down then rose with UE support    Exercises     Assessment/Plan    PT Assessment Patient needs continued PT services  PT Problem List Decreased mobility;Decreased knowledge of precautions;Decreased balance;Decreased knowledge of use of DME;Decreased strength;Decreased safety awareness          PT Treatment Interventions DME instruction;Gait training;Therapeutic activities;Therapeutic exercise;Patient/family education;Functional mobility training;Balance training    PT Goals (Current goals can be found in the Care Plan section)  Acute Rehab PT Goals Patient Stated Goal: To return to independent PT Goal Formulation: With patient/family Time For Goal Achievement: 01/16/16 Potential to Achieve Goals: Good    Frequency Min 5X/week   Barriers to discharge        Co-evaluation               End of Session Equipment Utilized During Treatment: Back brace Activity Tolerance: Patient tolerated treatment well Patient left: in chair;with call bell/phone within reach;with family/visitor present;with nursing/sitter in room           Time: 1610-9604 PT Time Calculation (min) (ACUTE ONLY): 30 min   Charges:   PT Evaluation $PT Eval Moderate Complexity: 1 Procedure PT Treatments $Gait Training: 8-22 mins   PT G CodesElray Mcgregor 2016/01/19, 11:35 AM  Sheran Lawless,  PT [phone] 01-19-2016

## 2016-01-09 NOTE — Clinical Social Work Note (Signed)
CSW consulted for New SNF. PT is recommending HHPT. CSW updated RNCM. CSW is signing off as no further needs identified.   Deshondra Worst, MSW, LCSW  Clinical Social Worker [phone] 

## 2016-01-09 NOTE — Progress Notes (Signed)
No acute events Awake and alert Moving legs with full strength Comfortable Ambulate with therapy after brace

## 2016-01-09 NOTE — Evaluation (Signed)
Occupational Therapy Evaluation Patient Details Name: Rachael Myers MRN: [id number] DOB: 1929-08-06 Today's Date: 01/09/2016    History of Present Illness Patient is an 83 y/o female admitted with lumbar spondylolisthesis and herniated nucleus pulposis presents for lumbar fusion and discectomy.  Now s/p L4-5 PLIF and R L5-S1 lamy for discectomy.   Clinical Impression   Pt with decline in function and safety with ADLs and ADL mobility with decreased balance and endurance. Pt would benefit from acute OT services to address impairments to increase level of function and safety    Follow Up Recommendations  No OT follow up;Supervision - Intermittent    Equipment Recommendations  Other (comment) (reacher, LH sponge)    Recommendations for Other Services       Precautions / Restrictions Precautions Precautions: Fall;Back Precaution Booklet Issued: Yes (comment) Precaution Comments: pt able to recall 1/3 back preacutions, reviewed back precautions with pt and husband Required Braces or Orthoses: Spinal Brace Spinal Brace: Lumbar corset;Applied in standing position Restrictions Weight Bearing Restrictions: No Other Position/Activity Restrictions: educated pt and husband on bed mobility techniques for getting OOB and back into bed      Mobility Bed Mobility Overal bed mobility: Needs Assistance Bed Mobility: Rolling;Sidelying to Sit;Sit to Supine;Sit to Sidelying Rolling: Min guard Sidelying to sit: Min assist   Sit to supine: Min assist Sit to sidelying: Min assist General bed mobility comments: increased time, cues for technique and use of rail, assist for lifting trunk and LEs back onto bed  Transfers Overall transfer level: Needs assistance Equipment used: Rolling walker (2 wheeled) Transfers: Sit to/from Stand Sit to Stand: Mod assist         General transfer comment:  pt leaning posteriorly initially; cues for hand placement    Balance Overall balance  assessment: Needs assistance   Sitting balance-Leahy Scale: Good   Postural control: Posterior lean Standing balance support: Bilateral upper extremity supported Standing balance-Leahy Scale: Poor Standing balance comment: min to mod support with UE support for balance, initially leaning back, then improved with increased time upright                            ADL Overall ADL's : Needs assistance/impaired     Grooming: Wash/dry hands;Wash/dry face;Sitting;Set up   Upper Body Bathing: Set up;Supervision/ safety   Lower Body Bathing: Moderate assistance;With caregiver independent assisting   Upper Body Dressing : Set up;Supervision/safety   Lower Body Dressing: Moderate assistance;With caregiver independent assisting   Toilet Transfer: Moderate assistance;BSC;Cueing for safety;RW   Toileting- Clothing Manipulation and Hygiene: Minimal assistance;Sit to/from stand;With caregiver independent assisting   Tub/ Shower Transfer: 3 in 1;Rolling walker;With caregiver independent assisting;Cueing for safety     General ADL Comments: educated pt and husband on ADL A/E and toileting aid for home use     Vision Vision Assessment?: No apparent visual deficits              Pertinent Vitals/Pain Pain Assessment: No/denies pain     Hand Dominance Right   Extremity/Trunk Assessment Upper Extremity Assessment Upper Extremity Assessment: Overall WFL for tasks assessed   Lower Extremity Assessment Lower Extremity Assessment: Defer to PT evaluation RLE Deficits / Details: AROM WFL, strength hip flexion 3+/5, knee extension 4/5, ankle DF 4+/5 LLE Deficits / Details: AROM WFL, strength hip flexion 3+/5, knee extension 4+/5, ankle DF 4-/5   Cervical / Trunk Assessment Cervical / Trunk Assessment: Normal   Communication Communication  Communication: No difficulties   Cognition Arousal/Alertness: Awake/alert Behavior During Therapy: WFL for tasks  assessed/performed Overall Cognitive Status: Within Functional Limits for tasks assessed                     General Comments   pt very pleasant and cooperative                 Home Living Family/patient expects to be discharged to:: Private residence Living Arrangements: Spouse/significant other Available Help at Discharge: Family;Available 24 hours/day Type of Home: House Home Access: Stairs to enter [employer] CorporationEntrance Stairs-Number of Steps: 1   Home Layout: One level     Bathroom Shower/Tub: Producer, television/film/videoWalk-in shower   Bathroom Toilet: Handicapped height     Home Equipment: Shower seat;Grab bars - tub/shower;Hand held Diplomatic Services operational officershower head;None;Walker - 2 wheels;Walker - 4 wheels          Prior Functioning/Environment Level of Independence: Independent        Comments: drives, does chores, etc        OT Problem List: Impaired balance (sitting and/or standing);Decreased knowledge of precautions;Decreased activity tolerance;Decreased knowledge of use of DME or AE   OT Treatment/Interventions: Self-care/ADL training;DME and/or AE instruction;Therapeutic activities;Patient/family education    OT Goals(Current goals can be found in the care plan section) Acute Rehab OT Goals Patient Stated Goal: To return to independent OT Goal Formulation: With patient/family Time For Goal Achievement: 01/16/16 Potential to Achieve Goals: Good ADL Goals Pt Will Perform Grooming: with supervision;with set-up;standing Pt Will Perform Lower Body Bathing: with min assist;with caregiver independent in assisting Pt Will Perform Lower Body Dressing: with min assist;with caregiver independent in assisting Pt Will Transfer to Toilet: with min assist;with min guard assist;ambulating Pt Will Perform Toileting - Clothing Manipulation and hygiene: with min guard assist;sit to/from stand Pt Will Perform Tub/Shower Transfer: with min assist;with min guard assist;with caregiver independent in assisting;shower  seat;3 in 1;ambulating Additional ADL Goal #1: Pt will recall 3/3 back precatuions and adhere to during ADLs and ADL mobility  OT Frequency: Min 2X/week   Barriers to D/C:    no barriers                     End of Session Equipment Utilized During Treatment: Gait belt;Rolling walker;Back brace;Other (comment) (BSC)  Activity Tolerance: Patient tolerated treatment well Patient left: in bed;with call bell/phone within reach;with family/visitor present   Time: [id number] OT Time Calculation (min): 32 min Charges:  OT General Charges $OT Visit: 1 Procedure OT Evaluation $OT Eval Moderate Complexity: 1 Procedure OT Treatments $Self Care/Home Management : 8-22 mins $Therapeutic Activity: 8-22 mins G-Codes:    Galen ManilaSpencer, Cortina Vultaggio Jeanette 01/09/2016, 1:26 PM

## 2016-01-10 NOTE — Progress Notes (Signed)
Occupational Therapy Treatment Patient Details Name: Rachael Myers MRN: [id number] DOB: November 04, 1929 Today's Date: 01/10/2016    History of present illness Patient is an 83 y/o female admitted with lumbar spondylolisthesis and herniated nucleus pulposis presents for lumbar fusion and discectomy.  Now s/p L4-5 PLIF and R L5-S1 lamy for discectomy.   OT comments  Pt. Able to complete bed mobility and short distance amb. To recliner.  Reports fears of right leg/knee buckling from yesterday.  Moving well, d/c likely tomorrow.  Will see in am for continued ADL training.    Follow Up Recommendations  No OT follow up;Supervision - Intermittent    Equipment Recommendations       Recommendations for Other Services      Precautions / Restrictions Precautions Precautions: Fall;Back Precaution Comments: pt. able to recall 3/3 back precautions Required Braces or Orthoses: Spinal Brace Spinal Brace: Lumbar corset;Applied in standing position       Mobility Bed Mobility Overal bed mobility: Modified Independent Bed Mobility: Rolling;Sidelying to Sit Rolling: Modified independent (Device/Increase time) Sidelying to sit: Modified independent (Device/Increase time)       General bed mobility comments: hob flat, used bed rails and reports she has a bed rail on her bed at home  Transfers Overall transfer level: Needs assistance Equipment used: Rolling walker (2 wheeled) Transfers: Sit to/from [employer] CorporationStand;Stand Pivot Transfers Sit to Stand: Min assist Stand pivot transfers: Min assist       General transfer comment: difficulty intially bringing hands from bed to RW. also guarded during amb. after reports of "right leg giving out yesterday".  pt. fearful of buckling today.  no buckling noted but gait appeard off will discuss with PT.    Balance                                   ADL Overall ADL's : Needs assistance/impaired                 Upper Body Dressing : Minimal  assistance;Sitting;Cueing for sequencing Upper Body Dressing Details (indicate cue type and reason): cues for donning brace     Toilet Transfer: RW;Ambulation Toilet Transfer Details (indicate cue type and reason): sim. bsc with use with eob and 4 steps to recliner Toileting- Clothing Manipulation and Hygiene: Minimal assistance;Sit to/from stand Toileting - Clothing Manipulation Details (indicate cue type and reason): simulated     Functional mobility during ADLs: Minimal assistance        Vision                     Perception     Praxis      Cognition   Behavior During Therapy: WFL for tasks assessed/performed Overall Cognitive Status: Within Functional Limits for tasks assessed                       Extremity/Trunk Assessment               Exercises     Shoulder Instructions       General Comments      Pertinent Vitals/ Pain       Pain Assessment: No/denies pain  Home Living  Prior Functioning/Environment              Frequency  Min 2X/week        Progress Toward Goals  OT Goals(current goals can now be found in the care plan section)  Progress towards OT goals: Progressing toward goals     Plan Discharge plan remains appropriate    Co-evaluation                 End of Session Equipment Utilized During Treatment: Gait belt;Rolling walker;Back brace;Other (comment)   Activity Tolerance Patient tolerated treatment well   Patient Left in chair;with call bell/phone within reach   Nurse Communication          Time: 1610-9604 OT Time Calculation (min): 12 min  Charges: OT General Charges $OT Visit: 1 Procedure  Robet Leu, COTA/L 01/10/2016, 9:29 AM

## 2016-01-10 NOTE — Progress Notes (Signed)
Physical Therapy Treatment Patient Details Name: Rachael Myers MRN: [id number] DOB: 04-29-1929 Today's Date: 01/10/2016    History of Present Illness Patient is an 83 y/o female admitted with lumbar spondylolisthesis and herniated nucleus pulposis presents for lumbar fusion and discectomy.  Now s/p L4-5 PLIF and R L5-S1 lamy for discectomy.    PT Comments    Pt progressing towards physical therapy goals. Was able to perform transfers and ambulation with min guard to min assist for balance support and safety. Pt fearful of RLE buckling during activity, however appeared that LLE was buckling during session. Inconsistent throughout session. With casual MMT, strength appears good. May need to look at more in depth next session if persists. Will continue to follow and progress as able per POC.   Follow Up Recommendations  Home health PT;Supervision/Assistance - 24 hour     Equipment Recommendations  None recommended by PT    Recommendations for Other Services       Precautions / Restrictions Precautions Precautions: Fall;Back Precaution Booklet Issued: Yes (comment) Precaution Comments: pt. able to recall 3/3 back precautions Required Braces or Orthoses: Spinal Brace Spinal Brace: Lumbar corset;Applied in standing position Restrictions Weight Bearing Restrictions: No    Mobility  Bed Mobility               General bed mobility comments: Pt sitting up in recliner upon PT arrival.   Transfers Overall transfer level: Needs assistance Equipment used: Rolling walker (2 wheeled) Transfers: Sit to/from Stand Sit to Stand: Min assist         General transfer comment: VC's for hand placement on seated surface for safety. Pt was able to power-up to full standing position with min assist for balance support and safety.   Ambulation/Gait Ambulation/Gait assistance: Min assist Ambulation Distance (Feet): 200 Feet Assistive device: Rolling walker (2 wheeled) Gait  Pattern/deviations: Step-through pattern;Decreased stride length;Leaning posteriorly Gait velocity: Decreased Gait velocity interpretation: Below normal speed for age/gender General Gait Details: Pre-gait at edge of chair consisted of marching in place as pt fearful of RLE buckling. With this activity, appeared that LLE was bucking, not the R. Pt states it has switched on her. Pt with minimal buckling during gait training, and 1 seated rest taken due to fatigue. Generally slow and guarded.    Stairs            Wheelchair Mobility    Modified Rankin (Stroke Patients Only)       Balance Overall balance assessment: Needs assistance Sitting-balance support: Feet supported;No upper extremity supported Sitting balance-Leahy Scale: Good   Postural control: Posterior lean Standing balance support: Bilateral upper extremity supported Standing balance-Leahy Scale: Poor Standing balance comment: UE support required.                     Cognition Arousal/Alertness: Awake/alert Behavior During Therapy: WFL for tasks assessed/performed Overall Cognitive Status: Within Functional Limits for tasks assessed                      Exercises      General Comments        Pertinent Vitals/Pain Pain Assessment: Faces Faces Pain Scale: Hurts a little bit Pain Location: Back Pain Descriptors / Indicators: Operative site guarding Pain Intervention(s): Limited activity within patient's tolerance;Monitored during session;Repositioned    Home Living                      Prior Function  PT Goals (current goals can now be found in the care plan section) Acute Rehab PT Goals Patient Stated Goal: To return to independent PT Goal Formulation: With patient/family Time For Goal Achievement: 01/16/16 Potential to Achieve Goals: Good Progress towards PT goals: Progressing toward goals    Frequency    Min 5X/week      PT Plan Current plan remains  appropriate    Co-evaluation             End of Session Equipment Utilized During Treatment: Back brace;Gait belt Activity Tolerance: Patient tolerated treatment well Patient left: in chair;with call bell/phone within reach;with chair alarm set     Time: 1020-1041 PT Time Calculation (min) (ACUTE ONLY): 21 min  Charges:  $Gait Training: 8-22 mins                    G Codes:      Marylynn PearsonLaura D Shylynn Bruning 01/10/2016, 3:18 PM   Conni SlipperLaura Shatonia Hoots, PT, DPT Acute Rehabilitation Services Pager: [phone]

## 2016-01-10 NOTE — Progress Notes (Signed)
Pt seen and examined. No issues overnight.  Frequent urination, no burning.  EXAM: Temp:  [98 F (36.7 C)-98.2 F (36.8 C)] 98 F (36.7 C) (01/06 0517) Pulse Rate:  [67-87] 87 (01/06 0517) Resp:  [18-20] 20 (01/06 0517) BP: (138-166)/(60-69) 160/64 (01/06 0517) SpO2:  [97 %-99 %] 99 % (01/06 0517) Weight:  [66.8 kg (147 lb 4.3 oz)] 66.8 kg (147 lb 4.3 oz) (01/06 0600) Intake/Output      01/05 0701 - 01/06 0700 01/06 0701 - 01/07 0700   I.V. (mL/kg) 800 (12)    Total Intake(mL/kg) 800 (12)    Urine (mL/kg/hr)     Stool     Blood     Total Output       Net +800          Urine Occurrence 11 x 1 x    Awake and alert Follows commands throughout Full strength  Stable Check UA Likely home tomorrow

## 2016-01-11 LAB — URINALYSIS, ROUTINE W REFLEX MICROSCOPIC
Bilirubin Urine: NEGATIVE
GLUCOSE, UA: NEGATIVE mg/dL
Hgb urine dipstick: NEGATIVE
KETONES UR: NEGATIVE mg/dL
LEUKOCYTES UA: NEGATIVE
Nitrite: NEGATIVE
PH: 6 (ref 5.0–8.0)
Protein, ur: NEGATIVE mg/dL
Specific Gravity, Urine: 1.006 (ref 1.005–1.030)

## 2016-01-11 MED ORDER — METHOCARBAMOL 750 MG PO TABS: 750.0000 mg | ORAL_TABLET | Freq: Four times a day (QID) | ORAL | 2 refills | Status: DC | PRN

## 2016-01-11 MED ORDER — PANTOPRAZOLE SODIUM 40 MG PO TBEC: 40.0000 mg | DELAYED_RELEASE_TABLET | Freq: Every day | ORAL | Status: DC

## 2016-01-11 NOTE — Progress Notes (Signed)
Physical Therapy Treatment Patient Details Name: Rachael Myers MRN: [id number] DOB: December 06, 1929 Today's Date: 01/11/2016    History of Present Illness Patient is an 83 y/o female admitted with lumbar spondylolisthesis and herniated nucleus pulposis presents for lumbar fusion and discectomy.  Now s/p L4-5 PLIF and R L5-S1 lamy for discectomy.    PT Comments    Pt progressing well with mobility. She has all needed DME at home.  Follow Up Recommendations  Home health PT;Supervision/Assistance - 24 hour     Equipment Recommendations  None recommended by PT    Recommendations for Other Services       Precautions / Restrictions Precautions Precautions: Fall;Back Precaution Comments: pt. able to recall 3/3 back precautions Required Braces or Orthoses: Spinal Brace Spinal Brace: Lumbar corset;Applied in standing position    Mobility  Bed Mobility               General bed mobility comments: Pt received in recliner and returned to recliner after Rx.  Transfers Overall transfer level: Needs assistance Equipment used: Rolling walker (2 wheeled) Transfers: Sit to/from UGI CorporationStand;Stand Pivot Transfers Sit to Stand: Min guard Stand pivot transfers: Min guard       General transfer comment: Min guard for safety only. Pt demo good/safe technique.   Ambulation/Gait Ambulation/Gait assistance: Min guard Ambulation Distance (Feet): 250 Feet Assistive device: Rolling walker (2 wheeled) Gait Pattern/deviations: Step-through pattern;Decreased stride length Gait velocity: Decreased Gait velocity interpretation: Below normal speed for age/gender General Gait Details: Increased time between sit to stand and initiation of gait due to h/o orthostatic hypotension. Pt reports multi episodes of passing out at home.   Stairs            Wheelchair Mobility    Modified Rankin (Stroke Patients Only)       Balance   Sitting-balance support: Feet supported;No upper extremity  supported Sitting balance-Leahy Scale: Good     Standing balance support: Bilateral upper extremity supported;During functional activity Standing balance-Leahy Scale: Fair Standing balance comment: RW needed for ambulation                    Cognition Arousal/Alertness: Awake/alert Behavior During Therapy: WFL for tasks assessed/performed Overall Cognitive Status: Within Functional Limits for tasks assessed                      Exercises      General Comments        Pertinent Vitals/Pain Pain Assessment: No/denies pain    Home Living                      Prior Function            PT Goals (current goals can now be found in the care plan section) Acute Rehab PT Goals Patient Stated Goal: To return to independent PT Goal Formulation: With patient/family Time For Goal Achievement: 01/16/16 Potential to Achieve Goals: Good Progress towards PT goals: Progressing toward goals    Frequency    Min 5X/week      PT Plan Current plan remains appropriate    Co-evaluation             End of Session Equipment Utilized During Treatment: Back brace;Gait belt Activity Tolerance: Patient tolerated treatment well Patient left: in chair;with call bell/phone within reach     Time: 1021-1038 PT Time Calculation (min) (ACUTE ONLY): 17 min  Charges:  $Gait Training: 8-22 mins  G Codes:      Ilda Foil 01/11/2016, 11:06 AM

## 2016-01-11 NOTE — Progress Notes (Addendum)
Occupational Therapy Treatment Patient Details Name: Rachael Myers MRN: [id number] DOB: 1929/09/17 Today's Date: 01/11/2016    History of present illness Patient is an 83 y/o female admitted with lumbar spondylolisthesis and herniated nucleus pulposis presents for lumbar fusion and discectomy.  Now s/p L4-5 PLIF and R L5-S1 lamy for discectomy.   OT comments  Pt. Making gains with skilled OT.  Reports her BLES are feeling stronger than previous day.  Able to amb. To/from b.room and complete shower stall transfer.  Husband available to assist with all LB ADLS as needed.  Pt. Eager for d/c home.  Clear for d/c from OT standpoint.  Will alert OTR/L to sign off.  Follow Up Recommendations  No OT follow up;Supervision - Intermittent    Equipment Recommendations  Other (comment)    Recommendations for Other Services      Precautions / Restrictions Precautions Precautions: Fall;Back Precaution Comments: pt. able to recall 3/3 back precautions Required Braces or Orthoses: Spinal Brace Spinal Brace: Lumbar corset;Applied in standing position       Mobility Bed Mobility               General bed mobility comments: Pt sitting up in recliner upon OT arrival.   Transfers Overall transfer level: Needs assistance Equipment used: Rolling walker (2 wheeled) Transfers: Sit to/from [employer] Sit to Stand: Min guard Stand pivot transfers: Min guard       General transfer comment: no physical assist required for sit/stand or stand/sit, good technique with hand placement    Balance                                   ADL Overall ADL's : Needs assistance/impaired     Grooming: Standing;Supervision/safety         Lower Body Bathing Details (indicate cue type and reason): reports husband able to assist as needed   Upper Body Dressing Details (indicate cue type and reason): cues for donning brace   Lower Body Dressing Details (indicate cue type  and reason): reports husband available to assist as needed Toilet Transfer: RW;Ambulation;Min guard     Toileting - Clothing Manipulation Details (indicate cue type and reason): simulated min guard in sit/stand Tub/ Shower Transfer: Walk-in shower;Min guard;Ambulation;Rolling walker;Anterior/posterior   Functional mobility during ADLs: Min guard General ADL Comments: pt. reports no A/E needs, husband will assist as needed.  performed shower transfer with min guard a but states she will likely sponge bathe initially      Vision                     Perception     Praxis      Cognition   Behavior During Therapy: [facility] for tasks assessed/performed Overall Cognitive Status: Within Functional Limits for tasks assessed                       Extremity/Trunk Assessment               Exercises     Shoulder Instructions       General Comments      Pertinent Vitals/ Pain       Pain Assessment: No/denies pain  Home Living  Prior Functioning/Environment              Frequency  Min 2X/week        Progress Toward Goals  OT Goals(current goals can now be found in the care plan section)  Progress towards OT goals: Goals met/education completed, patient discharged from [place] Discharge plan remains appropriate    Co-evaluation                 End of Session Equipment Utilized During Treatment: Gait belt;Rolling walker;Back brace   Activity Tolerance Patient tolerated treatment well   Patient Left in chair;with call bell/phone within reach   Nurse Communication          Time: 2122-4825 OT Time Calculation (min): 11 min  Charges: OT General Charges $OT Visit: 1 Procedure OT Treatments $Self Care/Home Management : 8-22 mins  Janice Coffin, COTA/L 01/11/2016, 9:26 AM

## 2016-01-11 NOTE — Progress Notes (Signed)
Pt seen and examined. No issues overnight.  EXAM: Temp:  [97 F (36.1 C)-98.4 F (36.9 C)] 97.7 F (36.5 C) (01/07 0900) Pulse Rate:  [58-82] 74 (01/07 0900) Resp:  [18-20] 18 (01/07 0900) BP: (113-160)/(51-75) 152/57 (01/07 0900) SpO2:  [96 %-98 %] 98 % (01/07 0900) Intake/Output      01/06 0701 - 01/07 0700 01/07 0701 - 01/08 0700   P.O. 720    I.V. (mL/kg)     Total Intake(mL/kg) 720 (10.8)    Net +720          Urine Occurrence 9 x 1 x   Stool Occurrence 3 x     Awake and alert Follows commands throughout Full strength  UA negative  Stable D/c

## 2016-01-11 NOTE — Care Management Note (Signed)
Case Management Note  Patient Details  Name: Loletta ParishBetty H Mcconico MRN: [id number] Date of Birth: 07-12-1929  Subjective/Objective:                  lumbar fusion and discectomy Action/Plan: Discharge planning Expected Discharge Date:  01/11/16               Expected Discharge Plan:  Home w Home Health Services  In-House Referral:     Discharge planning Services  CM Consult  Post Acute Care Choice:  Home Health Choice offered to:  Patient  DME Arranged:  N/A DME Agency:  [facility]  [facility]  [facility]  Status of Service:  Completed, signed off  If discussed at Long Length of Stay Meetings, dates discussed:    Additional Comments: CM spoke with pt for choice of home health agency.  Pt chooses [facility] to render HHPT.  [facility], Jermaine.  Pt states she has all the DME needed at home.  No other CM needs were communicated. Yves DillJeffries, Jasyah Theurer Christine, RN 01/11/2016, 11:13 AM

## 2016-01-11 NOTE — Discharge Instructions (Signed)
Spinal Fusion, Care After °Refer to this sheet in the next few weeks. These instructions provide you with information about caring for yourself after your procedure. Your health care provider may also give you more specific instructions. Your treatment has been planned according to current medical practices, but problems sometimes occur. Call your health care provider if you have any problems or questions after your procedure. °WHAT TO EXPECT AFTER THE PROCEDURE °After your procedure, it is common to have: °· Pain and stiffness in your back. °· Pain around your incision. °HOME CARE INSTRUCTIONS  °Medicines  °· Take over-the-counter and prescription medicines only as told by your health care provider. These include any pain medicines. °· Do not drive for 24 hours if you received a sedative. °· Do not drive or operate heavy machinery while taking prescription pain medicine. °· If you were prescribed an antibiotic medicine, take it as told by your health care provider. Do not stop taking the antibiotic even if you start to feel better. °Incision Care  °· Follow instructions from your health care provider about how to take care of your incision. Make sure you: °¨ Wash your hands with soap and water before you change your bandage (dressing). If soap and water are not available, use hand sanitizer. °¨ Change your dressing as told by your health care provider. °¨ Leave stitches (sutures), skin glue, or adhesive strips in place. These skin closures may need to be in place for 2 weeks or longer. If adhesive strip edges start to loosen and curl up, you may trim the loose edges. Do not remove adhesive strips completely unless your health care provider tells you to do that. °· Keep your incision clean and dry. Do not take baths, swim, or use a hot tub until your health care provider approves. °· Check your incision and the surrounding area every day for redness, swelling, and leaking fluid. °Physical Activity  °· Return to your  normal activities as told by your health care provider. Ask your health care provider what activities are safe for you. Rest and protect your back as much as possible. °· Follow instructions from your health care provider about how to move and use good posture to help your spine heal. °· Do not lift anything that is heavier than 8 lb (3.6 kg) or the limit that your health care provider tells you until he or she says that it is safe. Avoid lifting anything over your head. °· Do not twist or bend at the waist until your health care provider approves. °· Avoid pushing and pulling motions. °· Avoid sitting or lying down in the same position for long periods of time. °· Do not begin exercising until told by your health care provider. Ask your health care provider what kinds of exercise you can do to make your back stronger. °General Instructions  °· If you were given a brace, use it as told by your health care provider. °· Wear compression stockings as told by your health care provider. These stockings help to prevent blood clots and reduce swelling in your legs. °· Do not use tobacco products, including cigarettes, chewing tobacco, or e-cigarettes. If you need help quitting, ask your health care provider. °· Keep all follow-up visits as told by your health care provider and, if necessary, your physical therapist. This is important. °SEEK MEDICAL CARE IF: °· You have pain that gets worse or does not get better with medicine. °· Your legs or feet become painful or swollen. °· You have redness, swelling, or   pain at the site of your incision. °· You have fluid, blood, or pus coming from your incision. °· You vomit or feel nauseous. °· You have weakness or numbness in your legs that is new or getting worse. °· You have a fever. °· You have trouble controlling urination or bowel movements. °SEEK IMMEDIATE MEDICAL CARE IF:  °· You have severe pain. °· You have chest pain. °· You have trouble breathing. °· You develop a  cough. °These symptoms may represent a serious problem that is an emergency. Do not wait to see if the symptoms will go away. Get medical help right away. Call your local emergency services (911 in the [place] Do not drive yourself to the hospital.  °This information is not intended to replace advice given to you by your health care provider. Make sure you discuss any questions you have with your health care provider. °Document Released: 07/10/2004 Document Revised: 04/14/2015 Document Reviewed: 06/05/2014 °Elsevier Interactive Patient Education © 2017 [employer]. ° °

## 2016-01-11 NOTE — Discharge Summary (Signed)
Date of Admission: 01/08/2016  Date of Discharge: 01/11/16  PRE-OPERATIVE DIAGNOSIS:  Lumbar spondylolisthesis (L4-5); herniated nucleus pulposis (right L5-S1); lumbar radiculopathy  POST-OPERATIVE DIAGNOSIS:  Same  PROCEDURE:  L4-5 posterior lumbar interbody fusion, pedicle screw fixation L4-5 utilizing cortical trajectory pedicle screws; right L5-S1 laminotomy for discectomy; use of operating microscope; use of BMP  Attending: Loura HaltBenjamin Jared Jamee Pacholski, MD  Hospital Course:  The patient was admitted for the above listed operation and had an uncomplicated post-operative course.  They were discharged in stable condition.  Follow up: 3 weeks  Allergies as of 01/11/2016      Reactions   Acetaminophen Hives, Swelling, Rash   LIPS SWELL   Hydrocodone-acetaminophen Hives, Swelling, Rash   LIPS SWELL   Letairis [ambrisentan] Swelling   "SIGNIFICANT" FACIAL and EYE SWELLING   Opsumit [macitentan] Other (See Comments)   DELUSIONS HALLUCINATIONS MOUTH SORES   Ace Inhibitors Cough   Atenolol Other (See Comments)   FATIGUE   Hctz [hydrochlorothiazide] Other (See Comments)   HYPONATREMIA, REACTION NOT AN ALLERGY    Oxycodone Hives   Diltiazem Hcl Swelling   SWELLING REACTION UNSPECIFIED       Medication List    STOP taking these medications   naproxen sodium 220 MG tablet Commonly known as:  ANAPROX     TAKE these medications   ferrous sulfate 325 (65 FE) MG tablet Take 325 mg by mouth every 7 (seven) days.   gabapentin 600 MG tablet Commonly known as:  NEURONTIN Take 1 tablet (600 mg total) by mouth 3 (three) times daily.   methocarbamol 750 MG tablet Commonly known as:  ROBAXIN Take 1 tablet (750 mg total) by mouth every 6 (six) hours as needed for muscle spasms.   metoprolol succinate 25 MG 24 hr tablet Commonly known as:  TOPROL-XL Take 1 tablet (25 mg total) by mouth daily.   Potassium Chloride ER 20 MEQ Tbcr Take 20 mEq by mouth 2 (two) times daily.    pyridostigmine 60 MG tablet Commonly known as:  MESTINON Take 0.5 tablets (30 mg total) by mouth 3 (three) times daily.   Rivaroxaban 15 MG Tabs tablet Commonly known as:  XARELTO Take 15 mg by mouth daily with supper. Will stop 01-05-2016   Selexipag 1000 MCG Tabs Commonly known as:  UPTRAVI Take 1 tablet by mouth 2 (two) times daily.   sildenafil 20 MG tablet Commonly known as:  REVATIO Take 2 tablets (40 mg total) by mouth 3 (three) times daily.   torsemide 20 MG tablet Commonly known as:  DEMADEX Take 1-2 tablets (20-40 mg total) by mouth 2 (two) times daily. Two in the AM and one in the PM

## 2016-01-11 NOTE — Progress Notes (Signed)
Pt d/c to home by car with family. Assessment stable. All questions answered. 

## 2016-01-12 ENCOUNTER — Encounter (HOSPITAL_COMMUNITY): Payer: Self-pay | Admitting: Neurological Surgery

## 2016-01-13 MED FILL — Thrombin For Soln 5000 Unit: CUTANEOUS | Qty: 5000 | Status: AC

## 2016-01-19 NOTE — Addendum Note (Signed)
Encounter addended by: Ronell Duffus A Edgar Corrigan, RN on: 01/19/2016  2:44 PM<BR>    Actions taken: Delete clinical note

## 2016-02-03 ENCOUNTER — Encounter (HOSPITAL_COMMUNITY): Payer: Self-pay

## 2016-02-03 ENCOUNTER — Ambulatory Visit (HOSPITAL_COMMUNITY)
Admission: RE | Admit: 2016-02-03 | Discharge: 2016-02-03 | Disposition: A | Discharge status: Home / Self Care | Payer: Medicare Other | Source: Ambulatory Visit | Attending: Cardiology | Admitting: Cardiology

## 2016-02-03 VITALS — BP 132/80 | HR 62 | Wt 147.8 lb

## 2016-02-03 DIAGNOSIS — I482 Chronic atrial fibrillation, unspecified: Secondary | ICD-10-CM

## 2016-02-03 DIAGNOSIS — I5032 Chronic diastolic (congestive) heart failure: Secondary | ICD-10-CM

## 2016-02-03 DIAGNOSIS — Z79899 Other long term (current) drug therapy: Secondary | ICD-10-CM | POA: Insufficient documentation

## 2016-02-03 DIAGNOSIS — I2721 Secondary pulmonary arterial hypertension: Secondary | ICD-10-CM | POA: Insufficient documentation

## 2016-02-03 DIAGNOSIS — R55 Syncope and collapse: Secondary | ICD-10-CM | POA: Diagnosis not present

## 2016-02-03 DIAGNOSIS — I130 Hypertensive heart and chronic kidney disease with heart failure and stage 1 through stage 4 chronic kidney disease, or unspecified chronic kidney disease: Secondary | ICD-10-CM | POA: Insufficient documentation

## 2016-02-03 DIAGNOSIS — I272 Pulmonary hypertension, unspecified: Secondary | ICD-10-CM | POA: Diagnosis not present

## 2016-02-03 DIAGNOSIS — Z7901 Long term (current) use of anticoagulants: Secondary | ICD-10-CM | POA: Insufficient documentation

## 2016-02-03 NOTE — Progress Notes (Signed)
Patient ID: Rachael Myers, female   DOB: [birth year], 83 y.o.   MRN: [id number]    Advanced Heart Failure Clinic Note   PCP: Dr. Royanne Footsichard Orr HF Cardiology: Dr. Shirlee LatchMcLean  83 yo with chronic diastolic CHF, chronic atrial fibrillation, and prominent exertional dyspnea presents to CHF clinic for evaluation.  She has had atrial fibrillation long-term and has failed Multaq and propafenone use.  She is in atrial fibrillation chronically now.  In 1/16, she developed PNA.  She had a parapneumonic effusion that required 4 thoracenteses.  Eventually, she had VATS with decortication and a wedge resection.  Cytology was negative from the pleural fluid.  [facility] chest CT was [facility] in 8/16 and showed no PE, mild interstitial edema.  Last echo was done in 11/16, showing normal LV EF and apparently normal RV EF.  PA systolic pressure estimation was moderate to severely increased.  I took her for RHC in 12/16, this showed moderate pulmonary arterial hypertension.  PFTs showed a restrictive pattern concerning for interstitial lung disease.  V/Q scan in 12/16 was not suggestive of chronic PE.  Despite abnormal PFTs, high resolution CT did not show interstitial lung disease.    At a prior appointment, started her on Opsumit. She developed a cough and mouth soreness with this medication and had to stop it after about 7 days. Symptoms resolved when she stopped it. Tried to get Adcirca for her. This was denied by her insurance company, so she was started on Revatio 20 mg tid instead. She thinks that this has helped her breathing "a little." She saw a pulmonary specialist who [facility]. Next, tried her on ambrisentan, but she developed facial swelling on ambrisentan (significant around the eyes). She also developed worsening dyspnea. She stopped ambrisentan and the swelling resolved but she was noted to be volume overloaded. Swtched her Lasix to torsemide.   Earlier this month, she had a  back fusion.  Back pain is now much better.    She presents today for regular follow up.  Taking Revatio 40 mg tid. Her Cordie GriceUptravi is at 1000 mcg bid now, unable to tolerate further uptitration.  She is now on pyridostigmine with very few dizzy spells.  Not short of breath unless she walks fast or goes up an incline.  No chest pain, orthopnea/PND, palpitations. Weight is stable.   ECG: atrial fibrillation, left axis deviation, poor RWP  Labs (9/16): K 4, creatinine 1.31, BNP 466 Labs (11/16): BNP 577 Labs (12/16); K 4, creatinine 1.3, normal rheumatoid factor and scleroderma serologies Labs (1/17): ANA positive but only 1:80 titer, dsDNA negative, SSA/B negative, CCP negative.  Labs (2/17): K 4.1, creatinine 1.48 => 1.42, BNP 487 Labs (3/17): K 4.2, creatinine 1.58, BNP 326 Labs (7/17): K 3.3, creatinine 1.7, hgb 10.7 Labs (8/17): K 3.9, creatinine 1.87, HCT 35.6 Labs (1/18): hgb 9.9, creatinine 1.42  PMH: 1. Cardiolite (9/16) with EF 54%, no ischemia/infarction. 2. Chronic diastolic CHF: Echo (11/16) with EF 55-60%, normal RV size and systolic function, PA systolic pressure 66 mmHg.  3. Chronic atrial fibrillation: She has been intolerant of Multaq and propafenone.  Sotalol and flecainide have not been used due to CKD.   - Echo (7/17) with EF 60-65%, mildly dilated RV with mildly decreased systolic function, PA systolic pressure 36 mmHg.  4. SBO: Managed conservatively. 5. CKD 6. OA: h/o THR and TKR.  7. H/o hyponatremia 8. Pneumonia with parapneumonic effusion with thoracenteses and eventual VATS and  decortication.  9. Pulmonary hypertension: Suspect Group 1 pulmonary hypertension.  RHC (12/16) with mean RA 5, PA 62/22 mean 40, mean PCWP 19, CI 2.46, PVR 5 WU.  PFTs (12/16) wiith FVC 71%, FEV1 73%, ratio 104%, TLC 71%, DLCO 53% => moderate restriction concerning for interstitial lung disease.  High resolution CT (12/16) did not show evidence for interstitial lung disease.  V/Q scan (12/16)  did not show evidence for chronic PE.  Serologies: RF negative, scleroderma antibodies negative, ANA positive but only 1:80 titer, SSA/B negative, dsDNA negative, CCP negative.  She did not tolerate macitentan or ambrisentan.  Echo 7/17 with PA systolic pressure down to 36 mmHg.   - 6 minute walk (12/16) with 244 meters - 6 minute walk (2/17) with 232 meters - 6 minute walk (3/17) with 414 meters - 6 minute walk (4/17) with 267 meters - 6 minute walk (8/17) with 241 meters - 6 minute walk (10/17) with 256 meters 10. Lightheadedness/syncope: Extensive workup.  Event and holter monitors as well as telemetry monitoring negative.  She has not been orthostatic.  11. Sciatica: Status post back surgery 1/18.   SH: Married, lives in [place], nonsmoker, no ETOH.   FH: Brother with rheumatic fever.   ROS: All systems reviewed and negative except as per HPI.   Current Outpatient Prescriptions  Medication Sig Dispense Refill  . ferrous sulfate 325 (65 FE) MG tablet Take 325 mg by mouth every 7 (seven) days.     Marland Kitchen gabapentin (NEURONTIN) 600 MG tablet Take 1 tablet (600 mg total) by mouth 3 (three) times daily. 270 tablet 3  . metoprolol succinate (TOPROL-XL) 25 MG 24 hr tablet Take 1 tablet (25 mg total) by mouth daily. 90 tablet 3  . Potassium Chloride ER 20 MEQ TBCR Take 20 mEq by mouth 2 (two) times daily. 180 tablet 3  . pyridostigmine (MESTINON) 60 MG tablet Take 0.5 tablets (30 mg total) by mouth 3 (three) times daily. 45 tablet 3  . Rivaroxaban (XARELTO) 15 MG TABS tablet Take 15 mg by mouth daily with supper. Will stop 01-05-2016    . Selexipag (UPTRAVI) 1000 MCG TABS Take 1 tablet by mouth 2 (two) times daily. 60 tablet 6  . sildenafil (REVATIO) 20 MG tablet Take 2 tablets (40 mg total) by mouth 3 (three) times daily. 540 tablet 3  . torsemide (DEMADEX) 20 MG tablet Take 1-2 tablets (20-40 mg total) by mouth 2 (two) times daily. Two in the AM and one in the PM 360 tablet 3  . methocarbamol  (ROBAXIN) 750 MG tablet Take 1 tablet (750 mg total) by mouth every 6 (six) hours as needed for muscle spasms. (Patient not taking: Reported on 02/03/2016) 120 tablet 2   No current facility-administered medications for this encounter.    BP 132/80   Pulse 62   Wt 147 lb 12 oz (67 kg)   SpO2 99%   BMI 23.14 kg/m   Negative orthostatics.  General: NAD Neck: JVP 7 cm, no thyromegaly or thyroid nodule.  Lungs: Clear to auscultation bilaterally with normal respiratory effort. CV: Nondisplaced PMI.  Heart irregular S1/S2, no S3/S4.  1/6 HSM LLSB.  1+ ankle edema.  No carotid bruit.  Normal pedal pulses.  Abdomen: Soft,NT, ND, no HSM. No bruits or masses. +BS  Skin: Intact without lesions or rashes.  Neurologic: Alert and oriented x 3.  Psych: Normal affect. Extremities: No clubbing or cyanosis.  HEENT: Normal.   Assessment/Plan:  1. Chronic diastolic CHF:  NYHA  class II-III symptoms.  Volume status looks ok on exam. Overall stable. - Continue current torsemide, 40 qam/20 qpm.  BMET today.  2. Pulmonary hypertension: Moderate to severe pulmonary hypertension by echo (PASP 66 mmHg). RHC showed moderate pulmonary arterial hypertension with PVR 5 WU.  PFTs with restrictive changes suggestive of interstitial process but high resolution CT did not show interstitial lung disease.  No evidence for chronic PE on V/Q scan.  Rheumatologic serologies all negative.  Possible group 1 pulmonary hypertension.  She was unable to tolerate macitentan or ambrisentan.  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  She is now on Revatio 40 mg tid and has started selexipag, currently up to 1000 mcg bid, unable to increase selexipag further.  PA systolic pressure was down to 36 mmHg on most recent echo from 66 mmHg prior.  - Continue Selexipag at 1000 mcg bid, unable to tolerate uptitration of Selexipag any further.  - Continue Revatio 40 mg tid.    - Continue pulmonary rehab in [place].  - Given recent back surgery,  will hold off on 6 minute walk until next appt.  3. Pulmonary: s/p PNA with parapneumonic effusion and eventual VATS with decortication.  As above, restrictive PFTs but no ILD on high resolution CT.  4. Atrial fibrillation: Chronic, apparently > 1 year.  For now, planning rate control/anticoagulation strategy.   - Continue Toprol XL. - Continue Xarelto 15 mg daily, no overt bleeding.    5. HTN: BP mildly elevated today but will not change her meds.  6. Syncope: Has had syncopal episodes for at least 5 years now.  They always occur when standing, but not immediately after standing.  She has not been orthostatic when checked recently (multiple times).  No arrhythmia has been found, she has worn monitors in the past and was on telemetry in the hospital => had syncope at [facility] in 7/17 but no events on telemetry.  She has been seen by neurology. ?vasovagal.  She has baseline borderline hypertension.  She seems improved by pyridostigmine. - Continue pyridostigmine 30 mg tid.   - Continue to wear compression stockings.  - If she has further syncope, would consider sending her for evaluation by Dr Graciela Husbands, ?tilt table test.   Followup in 3 months.   [name] 02/03/2016

## 2016-02-03 NOTE — Patient Instructions (Signed)
Your physician recommends that you schedule a follow-up appointment in: 3 months.  

## 2016-02-23 ENCOUNTER — Ambulatory Visit: Payer: Medicare Other | Admitting: Neurology

## 2016-04-02 ENCOUNTER — Telehealth (HOSPITAL_COMMUNITY): Payer: Self-pay | Admitting: *Deleted

## 2016-04-02 NOTE — Telephone Encounter (Signed)
Patient called in complaining of increased SOB over the past couple days and wanted an appointment to see Dr. Shirlee Latch on Monday.  She reported her weight is down and no swelling present.  Says she mostly had trouble when laying down at night.    I spoke with Dr. Shirlee Latch and he said since patient is stable and her weight is down to add her on one day next week to see Otilio Saber, PA. He also advised patient to call 911 if her symptoms were to become worse over the weekend.    I called patient back and she is agreeable.  Appointment scheduled.

## 2016-04-06 ENCOUNTER — Encounter (HOSPITAL_COMMUNITY): Payer: Self-pay

## 2016-04-06 ENCOUNTER — Ambulatory Visit (HOSPITAL_COMMUNITY)
Admission: RE | Admit: 2016-04-06 | Discharge: 2016-04-06 | Disposition: A | Discharge status: Home / Self Care | Payer: Medicare Other | Source: Ambulatory Visit | Attending: Internal Medicine | Admitting: Internal Medicine

## 2016-04-06 VITALS — BP 140/62 | HR 60 | Wt 144.2 lb

## 2016-04-06 DIAGNOSIS — I482 Chronic atrial fibrillation, unspecified: Secondary | ICD-10-CM

## 2016-04-06 DIAGNOSIS — R55 Syncope and collapse: Secondary | ICD-10-CM | POA: Insufficient documentation

## 2016-04-06 DIAGNOSIS — N189 Chronic kidney disease, unspecified: Secondary | ICD-10-CM | POA: Diagnosis not present

## 2016-04-06 DIAGNOSIS — I272 Pulmonary hypertension, unspecified: Secondary | ICD-10-CM

## 2016-04-06 DIAGNOSIS — I10 Essential (primary) hypertension: Secondary | ICD-10-CM

## 2016-04-06 DIAGNOSIS — I2721 Secondary pulmonary arterial hypertension: Secondary | ICD-10-CM | POA: Diagnosis not present

## 2016-04-06 DIAGNOSIS — Z7901 Long term (current) use of anticoagulants: Secondary | ICD-10-CM | POA: Diagnosis not present

## 2016-04-06 DIAGNOSIS — K56609 Unspecified intestinal obstruction, unspecified as to partial versus complete obstruction: Secondary | ICD-10-CM | POA: Diagnosis not present

## 2016-04-06 DIAGNOSIS — I130 Hypertensive heart and chronic kidney disease with heart failure and stage 1 through stage 4 chronic kidney disease, or unspecified chronic kidney disease: Secondary | ICD-10-CM | POA: Diagnosis not present

## 2016-04-06 DIAGNOSIS — I5032 Chronic diastolic (congestive) heart failure: Secondary | ICD-10-CM | POA: Diagnosis not present

## 2016-04-06 DIAGNOSIS — Z79899 Other long term (current) drug therapy: Secondary | ICD-10-CM | POA: Insufficient documentation

## 2016-04-06 LAB — BASIC METABOLIC PANEL
ANION GAP: 11 (ref 5–15)
BUN: 19 mg/dL (ref 6–20)
CALCIUM: 9.1 mg/dL (ref 8.9–10.3)
CO2: 27 mmol/L (ref 22–32)
Chloride: 99 mmol/L — ABNORMAL LOW (ref 101–111)
Creatinine, Ser: 1.34 mg/dL — ABNORMAL HIGH (ref 0.44–1.00)
GFR, EST AFRICAN AMERICAN: 41 mL/min — AB (ref >60)
GFR, EST NON AFRICAN AMERICAN: 35 mL/min — AB (ref >60)
GLUCOSE: 87 mg/dL (ref 65–99)
Potassium: 3.8 mmol/L (ref 3.5–5.1)
Sodium: 137 mmol/L (ref 135–145)

## 2016-04-06 LAB — CBC
HCT: 33.3 % — ABNORMAL LOW (ref 36.0–46.0)
Hemoglobin: 10.5 g/dL — ABNORMAL LOW (ref 12.0–15.0)
MCH: 27.1 pg (ref 26.0–34.0)
MCHC: 31.5 g/dL (ref 30.0–36.0)
MCV: 86 fL (ref 78.0–100.0)
PLATELETS: 270 10*3/uL (ref 150–400)
RBC: 3.87 MIL/uL (ref 3.87–5.11)
RDW: 15.4 % (ref 11.5–15.5)
WBC: 7.2 10*3/uL (ref 4.0–10.5)

## 2016-04-06 LAB — TSH: TSH: 4.197 u[IU]/mL (ref 0.350–4.500)

## 2016-04-06 MED ORDER — METOPROLOL SUCCINATE ER 25 MG PO TB24: 37.5000 mg | ORAL_TABLET | Freq: Every day | ORAL | 3 refills | Status: DC

## 2016-04-06 MED ORDER — METOPROLOL SUCCINATE ER 25 MG PO TB24
37.5000 mg | ORAL_TABLET | Freq: Every day | ORAL | 3 refills | Status: DC
Start: 2016-04-06 — End: 2016-04-06

## 2016-04-06 NOTE — Progress Notes (Signed)
Patient ID: Rachael Myers, female   DOB: 1929/11/20, 83 y.o.   MRN: [id number]    Advanced Heart Failure Clinic Note   PCP: Dr. Royanne Foots HF Cardiology: Dr. Raymond Gurney is a 83 y.o. female with chronic diastolic CHF, chronic Afib, and prominent exertional dyspnea.    She has had atrial fibrillation long-term and has failed Multaq and propafenone use.  She is in atrial fibrillation chronically now.  In 1/16, she developed PNA.  She had a parapneumonic effusion that required 4 thoracenteses.  Eventually, she had VATS with decortication and a wedge resection.  Cytology was negative from the pleural fluid.  Last chest CT was at [facility] in 8/16 and showed no PE, mild interstitial edema.  Last echo was done in 11/16, showing normal LV EF and apparently normal RV EF.  PA systolic pressure estimation was moderate to severely increased.  I took her for RHC in 12/16, this showed moderate pulmonary arterial hypertension.  PFTs showed a restrictive pattern concerning for interstitial lung disease.  V/Q scan in 12/16 was not suggestive of chronic PE.  Despite abnormal PFTs, high resolution CT did not show interstitial lung disease.    At a prior appointment, started her on Opsumit. She developed a cough and mouth soreness with this medication and had to stop it after about 7 days. Symptoms resolved when she stopped it. Tried to get Adcirca for her. This was denied by her insurance company, so she was started on Revatio 20 mg tid instead. She thinks that this has helped her breathing "a little." She saw a pulmonary specialist who thought dyspnea was likely due to [facility]. Next, tried her on ambrisentan, but she developed facial swelling on ambrisentan (significant around the eyes). She also developed worsening dyspnea. She stopped ambrisentan and the swelling resolved but she was noted to be volume overloaded. Swtched her Lasix to torsemide.   Had a back fusion 01/2016. Back pain has much  improved.     She presents today for add on for more SOB and tachypalpitations. Highest they have noticed her HR 90-100. Feels more fatigued during these episodes. Tolerating Revatio 60 mg TID and Uptravi 1000 mcg BID (has not tolerated more uptravi). BP has been stable.  Only SOB in a hurry or up an incline.  No chest pain, orthopnea/PND, or palpitations. Weight at home stable.  Has noticed some fatigue in her legs and arms with house work.  No N/V, fever/chills, or GI upset.   ECG: Afib 66 bpm, fibrillation, LAD   Labs (1/18): hgb 9.9, creatinine 1.42, personally reviewed previous labs.   PMH: 1. Cardiolite (9/16) with EF 54%, no ischemia/infarction. 2. Chronic diastolic CHF: Echo (11/16) with EF 55-60%, normal RV size and systolic function, PA systolic pressure 66 mmHg.  3. Chronic atrial fibrillation: She has been intolerant of Multaq and propafenone.  Sotalol and flecainide have not been used due to CKD.   - Echo (7/17) with EF 60-65%, mildly dilated RV with mildly decreased systolic function, PA systolic pressure 36 mmHg.  4. SBO: Managed conservatively. 5. CKD 6. OA: h/o THR and TKR.  7. H/o hyponatremia 8. Pneumonia with parapneumonic effusion with thoracenteses and eventual VATS and decortication.  9. Pulmonary hypertension: Suspect Group 1 pulmonary hypertension.  RHC (12/16) with mean RA 5, PA 62/22 mean 40, mean PCWP 19, CI 2.46, PVR 5 WU.  PFTs (12/16) wiith FVC 71%, FEV1 73%, ratio 104%, TLC 71%, DLCO 53% => moderate restriction concerning for  interstitial lung disease.  High resolution CT (12/16) did not show evidence for interstitial lung disease.  V/Q scan (12/16) did not show evidence for chronic PE.  Serologies: RF negative, scleroderma antibodies negative, ANA positive but only 1:80 titer, SSA/B negative, dsDNA negative, CCP negative.  She did not tolerate macitentan or ambrisentan.  Echo 7/17 with PA systolic pressure down to 36 mmHg.   - 6 minute walk (12/16) with 244  meters - 6 minute walk (2/17) with 232 meters - 6 minute walk (3/17) with 414 meters - 6 minute walk (4/17) with 267 meters - 6 minute walk (8/17) with 241 meters - 6 minute walk (10/17) with 256 meters 10. Lightheadedness/syncope: Extensive workup.  Event and holter monitors as well as telemetry monitoring negative.  She has not been orthostatic.  11. Sciatica: Status post back surgery 1/18.   SH: Married, lives in [place], nonsmoker, no ETOH.   FH: Brother with rheumatic fever.   Review of systems complete and found to be negative unless listed in HPI.    Current Outpatient Prescriptions  Medication Sig Dispense Refill  . gabapentin (NEURONTIN) 600 MG tablet Take 600 mg by mouth 3 (three) times daily as needed (pain).    . metoprolol succinate (TOPROL-XL) 25 MG 24 hr tablet Take 1 tablet (25 mg total) by mouth daily. 90 tablet 3  . Potassium Chloride ER 20 MEQ TBCR Take 20 mEq by mouth 2 (two) times daily. 180 tablet 3  . pyridostigmine (MESTINON) 60 MG tablet Take 0.5 tablets (30 mg total) by mouth 3 (three) times daily. 45 tablet 3  . Rivaroxaban (XARELTO) 15 MG TABS tablet Take 15 mg by mouth daily with supper.     . Selexipag (UPTRAVI) 1000 MCG TABS Take 1 tablet by mouth 2 (two) times daily. 60 tablet 6  . sildenafil (REVATIO) 20 MG tablet Take 60 mg by mouth 3 (three) times daily.    Marland Kitchen torsemide (DEMADEX) 20 MG tablet Take 1-2 tablets (20-40 mg total) by mouth 2 (two) times daily. Two in the AM and one in the PM 360 tablet 3  . methocarbamol (ROBAXIN) 750 MG tablet Take 1 tablet (750 mg total) by mouth every 6 (six) hours as needed for muscle spasms. (Patient not taking: Reported on 02/03/2016) 120 tablet 2   No current facility-administered medications for this encounter.    BP 140/62   Pulse 60   Wt 144 lb 3.2 oz (65.4 kg)   SpO2 96%   BMI 22.58 kg/m    Wt Readings from Last 3 Encounters:  04/06/16 144 lb 3.2 oz (65.4 kg)  02/03/16 147 lb 12 oz (67 kg)  01/10/16  147 lb 4.3 oz (66.8 kg)    General: Elderly, NAD.  Neck: Supple. JVP 6-7 cm. Carotids 2+ bilat; no bruits. No thyromegaly or nodule noted.   Lungs: Clear, normal effort.  CV: PMI Non-displaced. Irregularly irregular. No S3/S4. 1/6 HSM LLSB.  Abdomen: Soft,NT, ND, no HSM. No bruits or masses. +BS  Skin: Intact without lesions or rashes.  Neurologic: Alert & oriented x 3. Cranial nerves grossly intact. Moves all 4 extremities w/o difficulty. Affect pleasant    Psych: Normal affect. Extremities: No clubbing, cyanosis, or rash. Trace ankle edema at most.  HEENT: Normal  Assessment/Plan:  1. Chronic diastolic CHF:  Echo 07/2015 with LVEF 60-65%, PA peak pressure 36 mm Hg. - NYHA class III symptoms. - She does not appear volume overloaded on exam - Continue torsemide 40 mg q am and  20 mg q pm. BMET today.  - Reinforced fluid restriction to < 2 L daily, sodium restriction to less than 2000 mg daily, and the importance of daily weights. 2. Pulmonary hypertension: Moderate to severe pulmonary hypertension by echo (PASP 66 mmHg). RHC showed moderate pulmonary arterial hypertension with PVR 5 WU.  PFTs with restrictive changes suggestive of interstitial process but high resolution CT did not show interstitial lung disease.  No evidence for chronic PE on V/Q scan.  Rheumatologic serologies all negative.  Possible group 1 pulmonary hypertension.  She was unable to tolerate macitentan or ambrisentan.  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  She is now on Revatio 40 mg tid and has started selexipag, currently up to 1000 mcg bid, unable to increase selexipag further.  PA systolic pressure was down to 36 mmHg on most recent echo from 66 mmHg prior.  - Continue Selexipag at 1000 mcg bid, unable to tolerate uptitration of Selexipag any further.  - Continue Revatio 60 mg tid.    - Continue pulmonary rehab in [place].  - With recent tachypalpitations and not feeling well, will hold off on 6 minute walk until next  appt.  3. Pulmonary: s/p PNA with parapneumonic effusion and eventual VATS with decortication.  As above, restrictive PFTs but no ILD on high resolution CT.  - Dyspnea likely multi-factorial. No change to current plan. Follow.  4. Atrial fibrillation: Chronic, apparently > 1 year.  For now, planning rate control/anticoagulation strategy.   - Will carefully increase Toprol XL to 37.5 mg qhs. If pt has worsening fatigue or lightheadedness knows to hold and call right away.  - Continue Xarelto 15 mg daily, no overt bleeding.    5. HTN:  - Meds as above.   6. Syncope:  - Has long history (> 5 years) of syncopal episodes with no clear etiology. They always occur when standing, but not immediately after standing.  She has not been orthostatic when checked recently (multiple times).  No arrhythmia has been found, she has worn monitors in the past and was on telemetry in the hospital => had syncope at [facility] in 7/17 but no events on telemetry.  She has been seen by neurology. ?vasovagal.  She has baseline borderline hypertension.  She seems improved by pyridostigmine. - Continue pyridostigmine 30 mg tid.   - Continue to wear compression stockings.  - If she has further syncope, would consider sending her for evaluation by Dr Graciela Husbands, ?tilt table test.  - No change at this time.   Increase toprol gently as above with tachypalpitations. Suspect dyspnea is multifactorial. Will keep appt with Dr. Shirlee Latch in around 1 month. Will also check CBC and TFTs with on going fatigue.   Graciella Freer, PA-C  04/06/2016   Greater than 50% of the 25 minute visit was spent in counseling/coordination of care regarding disease state education, medication reconciliation, review of medical regimen with on site Pharm-D, and salt/fluid restriction education.

## 2016-04-06 NOTE — Patient Instructions (Addendum)
Routine lab work today. Will notify you of abnormal results, otherwise no news is good news!  Increase Toprol to 37.5 mg (1.5 tabs) once daily at bedtime.  Follow up with Dr. Shirlee Latch as scheduled.  Do the following things EVERYDAY: 1) Weigh yourself in the morning before breakfast. Write it down and keep it in a log. 2) Take your medicines as prescribed 3) Eat low salt foods-Limit salt (sodium) to 2000 mg per day.  4) Stay as active as you can everyday 5) Limit all fluids for the day to less than 2 liters

## 2016-04-06 NOTE — Progress Notes (Signed)
Advanced Heart Failure Medication Review by a Pharmacist  Does the patient  feel that his/her medications are working for him/her?  yes  Has the patient been experiencing any side effects to the medications prescribed?  no  Does the patient measure his/her own blood pressure or blood glucose at home?  yes   Does the patient have any problems obtaining medications due to transportation or finances?   No - Uptravi $28/mo and sildenafil $7/mo  Understanding of regimen: good Understanding of indications: good Potential of compliance: good Patient understands to avoid NSAIDs. Patient understands to avoid decongestants.  Issues to address at subsequent visits: None   Pharmacist comments: Mrs. Brenn is a pleasant 83 yo F presenting with her husband of 6 years. She reports good compliance with her regimen and did not have any specific medication-related questions or concerns for me at this time. She did state that she is on the max tolerated dose of Uptravi at 1000 mcg BID (higher dosage causes intolerable diarrhea).   Tyler Deis. Bonnye Fava, PharmD, BCPS, CPP Clinical Pharmacist Pager: [phone] Phone: [phone] 04/06/2016 12:50 PM      Time with patient: 10 minutes Preparation and documentation time: 2 minutes Total time: 12 minutes

## 2016-04-07 LAB — T3, FREE: T3, Free: 2.6 pg/mL (ref 2.0–4.4)

## 2016-05-03 ENCOUNTER — Encounter (INDEPENDENT_AMBULATORY_CARE_PROVIDER_SITE_OTHER): Payer: Self-pay

## 2016-05-03 ENCOUNTER — Ambulatory Visit (HOSPITAL_COMMUNITY)
Admission: RE | Admit: 2016-05-03 | Discharge: 2016-05-03 | Disposition: A | Discharge status: Home / Self Care | Payer: Medicare Other | Source: Ambulatory Visit | Attending: Cardiology | Admitting: Cardiology

## 2016-05-03 ENCOUNTER — Encounter (HOSPITAL_COMMUNITY): Payer: Self-pay

## 2016-05-03 VITALS — HR 63 | Wt 143.4 lb

## 2016-05-03 DIAGNOSIS — I2721 Secondary pulmonary arterial hypertension: Secondary | ICD-10-CM | POA: Insufficient documentation

## 2016-05-03 DIAGNOSIS — I482 Chronic atrial fibrillation, unspecified: Secondary | ICD-10-CM

## 2016-05-03 DIAGNOSIS — N183 Chronic kidney disease, stage 3 unspecified: Secondary | ICD-10-CM

## 2016-05-03 DIAGNOSIS — N189 Chronic kidney disease, unspecified: Secondary | ICD-10-CM | POA: Insufficient documentation

## 2016-05-03 DIAGNOSIS — K56609 Unspecified intestinal obstruction, unspecified as to partial versus complete obstruction: Secondary | ICD-10-CM | POA: Insufficient documentation

## 2016-05-03 DIAGNOSIS — I5032 Chronic diastolic (congestive) heart failure: Secondary | ICD-10-CM | POA: Insufficient documentation

## 2016-05-03 DIAGNOSIS — R55 Syncope and collapse: Secondary | ICD-10-CM | POA: Insufficient documentation

## 2016-05-03 MED ORDER — METOPROLOL SUCCINATE ER 25 MG PO TB24: 25.0000 mg | ORAL_TABLET | Freq: Every day | ORAL | 3 refills | Status: DC

## 2016-05-03 MED ORDER — TORSEMIDE 20 MG PO TABS: 20.0000 mg | ORAL_TABLET | Freq: Two times a day (BID) | ORAL | 3 refills | Status: DC

## 2016-05-03 NOTE — Patient Instructions (Signed)
DECREASE Toprol-XL to 25 mg (1 Tablet) once Daily  START wearing compression stockings during the day.  Take prescription to pharmacy for them.   Follow up in 1 Month

## 2016-05-04 NOTE — Progress Notes (Signed)
Patient ID: Rachael Myers, female   DOB: 15-Nov-1929, 83 y.o.   MRN: [id number]    Advanced Heart Failure Clinic Note   PCP: Dr. Royanne Foots HF Cardiology: Dr. Shirlee Latch  83 yo with chronic diastolic CHF, chronic atrial fibrillation, and prominent exertional dyspnea presents to CHF clinic for evaluation.  She has had atrial fibrillation long-term and has failed Multaq and propafenone use.  She is in atrial fibrillation chronically now.  In 1/16, she developed PNA.  She had a parapneumonic effusion that required 4 thoracenteses.  Eventually, she had VATS with decortication and a wedge resection.  Cytology was negative from the pleural fluid.  Last chest CT was at [facility] in 8/16 and showed no PE, mild interstitial edema.  Last echo was done in 11/16, showing normal LV EF and apparently normal RV EF.  PA systolic pressure estimation was moderate to severely increased.  I took her for RHC in 12/16, this showed moderate pulmonary arterial hypertension.  PFTs showed a restrictive pattern concerning for interstitial lung disease.  V/Q scan in 12/16 was not suggestive of chronic PE.  Despite abnormal PFTs, high resolution CT did not show interstitial lung disease.    At a prior appointment, started her on Opsumit. She developed a cough and mouth soreness with this medication and had to stop it after about 7 days. Symptoms resolved when she stopped it. Tried to get Adcirca for her. This was denied by her insurance company, so she was started on Revatio 20 mg tid instead. She thinks that this has helped her breathing "a little." She saw a pulmonary specialist who thought dyspnea was likely due to [facility]. Next, tried her on ambrisentan, but she developed facial swelling on ambrisentan (significant around the eyes). She also developed worsening dyspnea. She stopped ambrisentan and the swelling resolved but she was noted to be volume overloaded. Switched her Lasix to torsemide.   She presents today for  regular followup.  Taking Revatio 40 mg tid. Her Cordie Grice is at 1000 mcg bid now, unable to tolerate further uptitration.  She is now on pyridostigmine because of orthostatic symptoms.  She initially was doing better on pyridostigmine. Her Toprol XL was increased to 37.5 mg daily at last appointment because of palpitations.  After this, she noted episodes of dizziness with standing.  Toprol XL was decreased to 25 mg daily again, but she continues to have episodes of lightheadedness with standing.  She is not orthostatic on evaluation in the office today.  BP is not low.  Not short of breath unless she walks fast or goes up an incline.  No chest pain, orthopnea/PND, palpitations. Weight is stable.   Labs (9/16): K 4, creatinine 1.31, BNP 466 Labs (11/16): BNP 577 Labs (12/16); K 4, creatinine 1.3, normal rheumatoid factor and scleroderma serologies Labs (1/17): ANA positive but only 1:80 titer, dsDNA negative, SSA/B negative, CCP negative.  Labs (2/17): K 4.1, creatinine 1.48 => 1.42, BNP 487 Labs (3/17): K 4.2, creatinine 1.58, BNP 326 Labs (7/17): K 3.3, creatinine 1.7, hgb 10.7 Labs (8/17): K 3.9, creatinine 1.87, HCT 35.6 Labs (1/18): hgb 9.9, creatinine 1.42 Labs (4/18): TSH normal, hgb 10.5, K 3.8, creatinine 1.34  PMH: 1. Cardiolite (9/16) with EF 54%, no ischemia/infarction. 2. Chronic diastolic CHF: Echo (11/16) with EF 55-60%, normal RV size and systolic function, PA systolic pressure 66 mmHg.  3. Chronic atrial fibrillation: She has been intolerant of Multaq and propafenone.  Sotalol and flecainide have not been used due to  CKD.   - Echo (7/17) with EF 60-65%, mildly dilated RV with mildly decreased systolic function, PA systolic pressure 36 mmHg.  4. SBO: Managed conservatively. 5. CKD 6. OA: h/o THR and TKR.  7. H/o hyponatremia 8. Pneumonia with parapneumonic effusion with thoracenteses and eventual VATS and decortication.  9. Pulmonary hypertension: Suspect Group 1 pulmonary  hypertension.  RHC (12/16) with mean RA 5, PA 62/22 mean 40, mean PCWP 19, CI 2.46, PVR 5 WU.  PFTs (12/16) wiith FVC 71%, FEV1 73%, ratio 104%, TLC 71%, DLCO 53% => moderate restriction concerning for interstitial lung disease.  High resolution CT (12/16) did not show evidence for interstitial lung disease.  V/Q scan (12/16) did not show evidence for chronic PE.  Serologies: RF negative, scleroderma antibodies negative, ANA positive but only 1:80 titer, SSA/B negative, dsDNA negative, CCP negative.  She did not tolerate macitentan or ambrisentan.  Echo 7/17 with PA systolic pressure down to 36 mmHg.   - 6 minute walk (12/16) with 244 meters - 6 minute walk (2/17) with 232 meters - 6 minute walk (3/17) with 414 meters - 6 minute walk (4/17) with 267 meters - 6 minute walk (8/17) with 241 meters - 6 minute walk (10/17) with 256 meters 10. Lightheadedness/syncope: Extensive workup.  Event and holter monitors as well as telemetry monitoring negative.  She has not been orthostatic.  11. Sciatica: Status post back surgery 1/18.   SH: Married, lives in [place], nonsmoker, no ETOH.   FH: Brother with rheumatic fever.   ROS: All systems reviewed and negative except as per HPI.   Current Outpatient Prescriptions  Medication Sig Dispense Refill  . gabapentin (NEURONTIN) 600 MG tablet Take 600 mg by mouth 3 (three) times daily as needed (pain).    . methocarbamol (ROBAXIN) 750 MG tablet Take 1 tablet (750 mg total) by mouth every 6 (six) hours as needed for muscle spasms. 120 tablet 2  . metoprolol succinate (TOPROL-XL) 25 MG 24 hr tablet Take 1 tablet (25 mg total) by mouth daily. 90 tablet 3  . Potassium Chloride ER 20 MEQ TBCR Take 20 mEq by mouth 2 (two) times daily. 180 tablet 3  . pyridostigmine (MESTINON) 60 MG tablet Take 0.5 tablets (30 mg total) by mouth 3 (three) times daily. 45 tablet 3  . Rivaroxaban (XARELTO) 15 MG TABS tablet Take 15 mg by mouth daily with supper.     . Selexipag  (UPTRAVI) 1000 MCG TABS Take 1 tablet by mouth 2 (two) times daily. 60 tablet 6  . sildenafil (REVATIO) 20 MG tablet Take 60 mg by mouth 3 (three) times daily.    Marland Kitchen torsemide (DEMADEX) 20 MG tablet Take 1-2 tablets (20-40 mg total) by mouth 2 (two) times daily. Two in the AM and one in the PM 270 tablet 3   No current facility-administered medications for this encounter.    Pulse 63   Wt 143 lb 6.4 oz (65 kg)   SpO2 97%   BMI 22.46 kg/m   Negative orthostatics.  General: NAD Neck: JVP not elevated, no thyromegaly or thyroid nodule.  Lungs: Clear to auscultation bilaterally with normal respiratory effort. CV: Nondisplaced PMI.  Heart irregular S1/S2, no S3/S4.  1/6 HSM LLSB.  No edema.  No carotid bruit.  Normal pedal pulses.  Abdomen: Soft,NT, ND, no HSM. No bruits or masses. +BS  Skin: Intact without lesions or rashes.  Neurologic: Alert and oriented x 3.  Psych: Normal affect. Extremities: No clubbing or cyanosis.  HEENT:  Normal.   Assessment/Plan:  1. Chronic diastolic CHF:  NYHA class II-III symptoms.  Volume status looks ok on exam. Overall stable. - Continue current torsemide, 40 qam/20 qpm.  Recent BMET ok.  2. Pulmonary hypertension: Moderate to severe pulmonary hypertension by echo (PASP 66 mmHg). RHC showed moderate pulmonary arterial hypertension with PVR 5 WU.  PFTs with restrictive changes suggestive of interstitial process but high resolution CT did not show interstitial lung disease.  No evidence for chronic PE on V/Q scan.  Rheumatologic serologies all negative.  Possible group 1 pulmonary hypertension.  She was unable to tolerate macitentan or ambrisentan.  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  She is now on Revatio 40 mg tid and has started selexipag, currently up to 1000 mcg bid, unable to increase selexipag further.  PA systolic pressure was down to 36 mmHg on most recent echo from 66 mmHg prior.  - Continue Selexipag at 1000 mcg bid, unable to tolerate uptitration of  Selexipag any further.  - Continue Revatio 40 mg tid.    - With her dizziness/lightheadedness, will hold off on 6 minute walk until next appointment.   3. Pulmonary: s/p PNA with parapneumonic effusion and eventual VATS with decortication.  As above, restrictive PFTs but no ILD on high resolution CT.  4. Atrial fibrillation: Chronic, apparently > 1 year.  For now, planning rate control/anticoagulation strategy.   - Continue Toprol XL. - Continue Xarelto 15 mg daily, no overt bleeding.    5. Syncope/lightheadedness: Has had syncopal episodes for at least 5 years now.  They always occur when standing, but not immediately after standing.  Currently, no syncopal episodes but she has noted dizziness when standing.  She has not been orthostatic when checked at past appointments or today.  No arrhythmia has been found, she has worn monitors in the past and was on telemetry in the hospital => had syncope at [facility] in 7/17 but no events on telemetry.  She has been seen by neurology. ?vasovagal.   - Continue pyridostigmine 30 mg tid, she had some improvement with this.   - Wear graded compression stockings.  - Decrease Toprol XL to 12.5 mg daily and take in the evening.    Followup in 1 month   [name] 05/04/2016

## 2016-05-10 ENCOUNTER — Other Ambulatory Visit (HOSPITAL_COMMUNITY): Payer: Self-pay | Admitting: Cardiology

## 2016-05-10 MED ORDER — METOPROLOL SUCCINATE ER 25 MG PO TB24: 25.0000 mg | ORAL_TABLET | Freq: Every day | ORAL | 3 refills | Status: DC

## 2016-06-07 ENCOUNTER — Ambulatory Visit (HOSPITAL_COMMUNITY)
Admission: RE | Admit: 2016-06-07 | Discharge: 2016-06-07 | Disposition: A | Discharge status: Home / Self Care | Payer: Medicare Other | Source: Ambulatory Visit | Attending: Cardiology | Admitting: Cardiology

## 2016-06-07 VITALS — BP 160/76 | HR 70 | Wt 136.5 lb

## 2016-06-07 DIAGNOSIS — I272 Pulmonary hypertension, unspecified: Secondary | ICD-10-CM | POA: Insufficient documentation

## 2016-06-07 DIAGNOSIS — I482 Chronic atrial fibrillation, unspecified: Secondary | ICD-10-CM

## 2016-06-07 DIAGNOSIS — I5032 Chronic diastolic (congestive) heart failure: Secondary | ICD-10-CM | POA: Insufficient documentation

## 2016-06-07 DIAGNOSIS — Z7901 Long term (current) use of anticoagulants: Secondary | ICD-10-CM | POA: Insufficient documentation

## 2016-06-07 DIAGNOSIS — R55 Syncope and collapse: Secondary | ICD-10-CM | POA: Insufficient documentation

## 2016-06-07 DIAGNOSIS — M1990 Unspecified osteoarthritis, unspecified site: Secondary | ICD-10-CM | POA: Insufficient documentation

## 2016-06-07 DIAGNOSIS — N189 Chronic kidney disease, unspecified: Secondary | ICD-10-CM | POA: Insufficient documentation

## 2016-06-07 DIAGNOSIS — K56609 Unspecified intestinal obstruction, unspecified as to partial versus complete obstruction: Secondary | ICD-10-CM | POA: Diagnosis not present

## 2016-06-07 LAB — BASIC METABOLIC PANEL
ANION GAP: 10 (ref 5–15)
BUN: 29 mg/dL — AB (ref 6–20)
CALCIUM: 9.1 mg/dL (ref 8.9–10.3)
CO2: 26 mmol/L (ref 22–32)
CREATININE: 1.45 mg/dL — AB (ref 0.44–1.00)
Chloride: 102 mmol/L (ref 101–111)
GFR calc Af Amer: 37 mL/min — ABNORMAL LOW (ref >60)
GFR, EST NON AFRICAN AMERICAN: 32 mL/min — AB (ref >60)
GLUCOSE: 93 mg/dL (ref 65–99)
Potassium: 4.9 mmol/L (ref 3.5–5.1)
Sodium: 138 mmol/L (ref 135–145)

## 2016-06-07 LAB — BRAIN NATRIURETIC PEPTIDE: B Natriuretic Peptide: 415.4 pg/mL — ABNORMAL HIGH (ref 0.0–100.0)

## 2016-06-07 MED ORDER — SILDENAFIL CITRATE 20 MG PO TABS: 60.0000 mg | ORAL_TABLET | Freq: Three times a day (TID) | ORAL | 6 refills | Status: DC

## 2016-06-07 NOTE — Patient Instructions (Signed)
Increase Sildenafil to 60 mg (3 tabs) Three times a day   Labs today  Your physician has requested that you have an echocardiogram. Echocardiography is a painless test that uses sound waves to create images of your heart. It provides your doctor with information about the size and shape of your heart and how well your heart's chambers and valves are working. This procedure takes approximately one hour. There are no restrictions for this procedure.  IN July  We will contact you in 3 months to schedule your next appointment.

## 2016-06-07 NOTE — Progress Notes (Signed)
Patient ID: Loletta ParishBetty H Myers, female   DOB: [birth year], 83 y.o.   MRN: [id number]    Advanced Heart Failure Clinic Note   PCP: Dr. Royanne Footsichard Orr HF Cardiology: Dr. Shirlee LatchMcLean  83 yo with chronic diastolic CHF, chronic atrial fibrillation, and prominent exertional dyspnea presents to CHF clinic for evaluation.  She has had atrial fibrillation long-term and has failed Multaq and propafenone use.  She is in atrial fibrillation chronically now.  In 1/16, she developed PNA.  She had a parapneumonic effusion that required 4 thoracenteses.  Eventually, she had VATS with decortication and a wedge resection.  Cytology was negative from the pleural fluid.  [facility] and showed no PE, mild interstitial edema.  Last echo was done in 11/16, showing normal LV EF and apparently normal RV EF.  PA systolic pressure estimation was moderate to severely increased.  I took her for RHC in 12/16, this showed moderate pulmonary arterial hypertension.  PFTs showed a restrictive pattern concerning for interstitial lung disease.  V/Q scan in 12/16 was not suggestive of chronic PE.  Despite abnormal PFTs, high resolution CT did not show interstitial lung disease.    At a prior appointment, started her on Opsumit. She developed a cough and mouth soreness with this medication and had to stop it after about 7 days. Symptoms resolved when she stopped it. Tried to get Adcirca for her. This was denied by her insurance company, so she was started on Revatio 20 mg tid instead. She thinks that this has helped her breathing "a little." She saw a pulmonary specialist who thought dysp[facility] was [facility]. Next, tried her on ambrisentan, but she developed facial swelling on ambrisentan (significant around the eyes). She also developed worsening dyspnea. She stopped ambrisentan and the swelling resolved but she was noted to be volume overloaded. Switched her Lasix to torsemide.   Today she returns for HF  follow up. Overall feeling ok. Denies SOB/PND/Orthopnea. Says she only takes 12.5 mg Toprol XL if SBP < 120. If SBP > 120 she will take Toprol XL 25 mg daily. Over the weekend she has not been dizzy. She tries to remain active and attend church. Denies syncope. No falls. Weight at home 135 pounds. No bleeding problems. Taking all medications.   Labs (9/16): K 4, creatinine 1.31, BNP 466 Labs (11/16): BNP 577 Labs (12/16); K 4, creatinine 1.3, normal rheumatoid factor and scleroderma serologies Labs (1/17): ANA positive but only 1:80 titer, dsDNA negative, SSA/B negative, CCP negative.  Labs (2/17): K 4.1, creatinine 1.48 => 1.42, BNP 487 Labs (3/17): K 4.2, creatinine 1.58, BNP 326 Labs (7/17): K 3.3, creatinine 1.7, hgb 10.7 Labs (8/17): K 3.9, creatinine 1.87, HCT 35.6 Labs (1/18): hgb 9.9, creatinine 1.42 Labs (4/18): TSH normal, hgb 10.5, K 3.8, creatinine 1.34  PMH: 1. Cardiolite (9/16) with EF 54%, no ischemia/infarction. 2. Chronic diastolic CHF: Echo (11/16) with EF 55-60%, normal RV size and systolic function, PA systolic pressure 66 mmHg.  3. Chronic atrial fibrillation: She has been intolerant of Multaq and propafenone.  Sotalol and flecainide have not been used due to CKD.   - Echo (7/17) with EF 60-65%, mildly dilated RV with mildly decreased systolic function, PA systolic pressure 36 mmHg.  4. SBO: Managed conservatively. 5. CKD 6. OA: h/o THR and TKR.  7. H/o hyponatremia 8. Pneumonia with parapneumonic effusion with thoracenteses and eventual VATS and decortication.  9. Pulmonary hypertension: Suspect Group 1 pulmonary hypertension.  RHC (12/16) with mean RA 5, PA 62/22 mean 40, mean PCWP 19, CI 2.46, PVR 5 WU.  PFTs (12/16) wiith FVC 71%, FEV1 73%, ratio 104%, TLC 71%, DLCO 53% => moderate restriction concerning for interstitial lung disease.  High resolution CT (12/16) did not show evidence for interstitial lung disease.  V/Q scan (12/16) did not show evidence for chronic PE.   Serologies: RF negative, scleroderma antibodies negative, ANA positive but only 1:80 titer, SSA/B negative, dsDNA negative, CCP negative.  She did not tolerate macitentan or ambrisentan.  Echo 7/17 with PA systolic pressure down to 36 mmHg.   - 6 minute walk (12/16) with 244 meters - 6 minute walk (2/17) with 232 meters - 6 minute walk (3/17) with 414 meters - 6 minute walk (4/17) with 267 meters - 6 minute walk (8/17) with 241 meters - 6 minute walk (10/17) with 256 meters - 6 minute walk (6/18) with 305 meters 10. Lightheadedness/syncope: Extensive workup.  Event and holter monitors as well as telemetry monitoring negative.  She has not been orthostatic.  11. Sciatica: Status post back surgery 1/18.   SH: Married, lives in [place], nonsmoker, no ETOH.   FH: Brother with rheumatic fever.   ROS: All systems reviewed and negative except as per HPI.   Current Outpatient Prescriptions  Medication Sig Dispense Refill  . gabapentin (NEURONTIN) 600 MG tablet Take 600 mg by mouth 3 (three) times daily as needed (pain).    . methocarbamol (ROBAXIN) 750 MG tablet Take 1 tablet (750 mg total) by mouth every 6 (six) hours as needed for muscle spasms. 120 tablet 2  . metoprolol succinate (TOPROL-XL) 25 MG 24 hr tablet Take 1 tablet (25 mg total) by mouth daily. 90 tablet 3  . Potassium Chloride ER 20 MEQ TBCR Take 20 mEq by mouth 2 (two) times daily. 180 tablet 3  . pyridostigmine (MESTINON) 60 MG tablet Take 0.5 tablets (30 mg total) by mouth 3 (three) times daily. 45 tablet 3  . Rivaroxaban (XARELTO) 15 MG TABS tablet Take 15 mg by mouth daily with supper.     . Selexipag (UPTRAVI) 1000 MCG TABS Take 1 tablet by mouth 2 (two) times daily. 60 tablet 6  . sildenafil (REVATIO) 20 MG tablet Take 40 mg by mouth 3 (three) times daily.    Marland Kitchen torsemide (DEMADEX) 20 MG tablet Take 1-2 tablets (20-40 mg total) by mouth 2 (two) times daily. Two in the AM and one in the PM 270 tablet 3   No current  facility-administered medications for this encounter.    BP (!) 160/76   Pulse 70   Wt 136 lb 8 oz (61.9 kg)   SpO2 98%   BMI 21.38 kg/m   Filed Weights   06/07/16 1145  Weight: 136 lb 8 oz (61.9 kg)     General:  Well appearing. No resp difficulty HEENT: normal Neck: supple. JVP 8 cm. Carotids 2+ bilat; no bruits. No lymphadenopathy or thryomegaly appreciated. Cor: PMI nondisplaced. Regular rate & rhythm. No rubs, gallops.  2/6 HSM LLSB. Lungs: clear Abdomen: soft, nontender, nondistended. No hepatosplenomegaly. No bruits or masses. Good bowel sounds. Extremities: no cyanosis, clubbing, rash, edema Neuro: alert & orientedx3, cranial nerves grossly intact. moves all 4 extremities w/o difficulty. Affect pleasant  Assessment/Plan:  1. Chronic diastolic CHF:  NYHA class II-III symptoms.  Overall stable. - Continue current torsemide, 40 qam/20 qpm.   2. Pulmonary hypertension: Moderate to severe pulmonary hypertension by echo (PASP 66 mmHg). RHC showed  moderate pulmonary arterial hypertension with PVR 5 WU.  PFTs with restrictive changes suggestive of interstitial process but high resolution CT did not show interstitial lung disease.  No evidence for chronic PE on V/Q scan.  Rheumatologic serologies all negative.  Possible group 1 pulmonary hypertension.  She was unable to tolerate macitentan or ambrisentan.  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  She is now on Revatio 40 mg tid and has started selexipag, currently up to 1000 mcg bid, unable to increase selexipag further.  PA systolic pressure was down to 36 mmHg on most recent echo from 66 mmHg prior.  - Continue Selexipag at 1000 mcg bid, unable to tolerate uptitration of Selexipag any further.  - Increase Revatio 60 mg three times a day.  - Repeat ECHO in July.  - today => improved.    3. Pulmonary: s/p PNA with parapneumonic effusion and eventual VATS with decortication.  As above, restrictive PFTs but no ILD on high resolution CT.    4. Atrial fibrillation: Chronic.  For now, planning rate control/anticoagulation strategy.   - Continue Toprol XL. - Continue Xarelto 15 mg daily, no bleeding problems. .    5. Syncope/lightheadedness: Has had syncopal episodes for at least 5 years now.  They always occur when standing, but not immediately after standing.  Currently, no syncopal episodes but she has noted dizziness when standing.  She has not been orthostatic when checked at past appointments or today.  No arrhythmia has been found, she has worn monitors in the past and was on telemetry in the hospital => had syncope at [facility] in 7/17 but no events on telemetry.  She has been seen by neurology. ?vasovagal.   - Continue pyridostigmine 30 mg tid, she had some improvement with this.   - Continue current dose to Toprol XL at bed time  Rachael Myers 06/07/2016   Patient seen with NP, agree with the above note.  She is doing well.  6 minute walk improved.  Volume status looks ok today.  - Increase Revatio to 60 mg tid.  - Echo in 7/18.  - BMET/BNP today.   Followup in 3 months.   Rachael Myers 06/08/2016

## 2016-06-07 NOTE — Progress Notes (Signed)
6 min walk- completed   Distance-[id number]  Starting O2-98 Ending O2-93  Starting heart rate- 79 Ending heart rate 84

## 2016-06-23 ENCOUNTER — Other Ambulatory Visit (HOSPITAL_COMMUNITY): Payer: Self-pay | Admitting: Cardiology

## 2016-06-23 MED ORDER — SELEXIPAG 1000 MCG PO TABS: 1.0000 | ORAL_TABLET | Freq: Two times a day (BID) | ORAL | 6 refills | Status: DC

## 2016-07-16 ENCOUNTER — Other Ambulatory Visit (HOSPITAL_COMMUNITY): Payer: Self-pay | Admitting: *Deleted

## 2016-07-16 ENCOUNTER — Ambulatory Visit (HOSPITAL_COMMUNITY)
Admission: RE | Admit: 2016-07-16 | Discharge: 2016-07-16 | Disposition: A | Discharge status: Home / Self Care | Payer: Medicare Other | Source: Ambulatory Visit | Attending: Cardiology | Admitting: Cardiology

## 2016-07-16 DIAGNOSIS — I272 Pulmonary hypertension, unspecified: Secondary | ICD-10-CM

## 2016-07-16 MED ORDER — PYRIDOSTIGMINE BROMIDE 60 MG PO TABS: 30.0000 mg | ORAL_TABLET | Freq: Three times a day (TID) | ORAL | 6 refills | Status: DC

## 2016-09-10 ENCOUNTER — Encounter (HOSPITAL_COMMUNITY): Payer: Self-pay | Admitting: Cardiology

## 2016-09-27 ENCOUNTER — Ambulatory Visit (HOSPITAL_COMMUNITY)
Admission: RE | Admit: 2016-09-27 | Discharge: 2016-09-27 | Disposition: A | Discharge status: Home / Self Care | Payer: Medicare Other | Source: Ambulatory Visit | Attending: Cardiology | Admitting: Cardiology

## 2016-09-27 ENCOUNTER — Encounter (HOSPITAL_COMMUNITY): Payer: Self-pay | Admitting: Cardiology

## 2016-09-27 VITALS — BP 118/70 | HR 60 | Wt 143.1 lb

## 2016-09-27 DIAGNOSIS — R55 Syncope and collapse: Secondary | ICD-10-CM | POA: Diagnosis not present

## 2016-09-27 DIAGNOSIS — Z9889 Other specified postprocedural states: Secondary | ICD-10-CM | POA: Insufficient documentation

## 2016-09-27 DIAGNOSIS — Z96659 Presence of unspecified artificial knee joint: Secondary | ICD-10-CM | POA: Diagnosis not present

## 2016-09-27 DIAGNOSIS — I272 Pulmonary hypertension, unspecified: Secondary | ICD-10-CM | POA: Diagnosis not present

## 2016-09-27 DIAGNOSIS — I5032 Chronic diastolic (congestive) heart failure: Secondary | ICD-10-CM

## 2016-09-27 DIAGNOSIS — I482 Chronic atrial fibrillation, unspecified: Secondary | ICD-10-CM

## 2016-09-27 DIAGNOSIS — N189 Chronic kidney disease, unspecified: Secondary | ICD-10-CM | POA: Diagnosis not present

## 2016-09-27 DIAGNOSIS — I2721 Secondary pulmonary arterial hypertension: Secondary | ICD-10-CM | POA: Diagnosis not present

## 2016-09-27 DIAGNOSIS — M1990 Unspecified osteoarthritis, unspecified site: Secondary | ICD-10-CM | POA: Diagnosis not present

## 2016-09-27 DIAGNOSIS — Z7902 Long term (current) use of antithrombotics/antiplatelets: Secondary | ICD-10-CM | POA: Insufficient documentation

## 2016-09-27 LAB — BASIC METABOLIC PANEL
ANION GAP: 7 (ref 5–15)
BUN: 24 mg/dL — AB (ref 6–20)
CALCIUM: 8.9 mg/dL (ref 8.9–10.3)
CO2: 29 mmol/L (ref 22–32)
Chloride: 102 mmol/L (ref 101–111)
Creatinine, Ser: 1.36 mg/dL — ABNORMAL HIGH (ref 0.44–1.00)
GFR calc Af Amer: 40 mL/min — ABNORMAL LOW (ref >60)
GFR, EST NON AFRICAN AMERICAN: 34 mL/min — AB (ref >60)
Glucose, Bld: 93 mg/dL (ref 65–99)
POTASSIUM: 3.9 mmol/L (ref 3.5–5.1)
SODIUM: 138 mmol/L (ref 135–145)

## 2016-09-27 LAB — CBC
HEMATOCRIT: 37.1 % (ref 36.0–46.0)
Hemoglobin: 11.7 g/dL — ABNORMAL LOW (ref 12.0–15.0)
MCH: 29.3 pg (ref 26.0–34.0)
MCHC: 31.5 g/dL (ref 30.0–36.0)
MCV: 92.8 fL (ref 78.0–100.0)
PLATELETS: 230 10*3/uL (ref 150–400)
RBC: 4 MIL/uL (ref 3.87–5.11)
RDW: 14.4 % (ref 11.5–15.5)
WBC: 8.2 10*3/uL (ref 4.0–10.5)

## 2016-09-27 LAB — BRAIN NATRIURETIC PEPTIDE: B NATRIURETIC PEPTIDE 5: 362.6 pg/mL — AB (ref 0.0–100.0)

## 2016-09-27 MED ORDER — SILDENAFIL CITRATE 20 MG PO TABS: 80.0000 mg | ORAL_TABLET | Freq: Three times a day (TID) | ORAL | 6 refills | Status: DC

## 2016-09-27 NOTE — Progress Notes (Signed)
Patient ID: Rachael Myers, female   DOB: 24-Apr-1929, 83 y.o.   MRN: [id number]    Advanced Heart Failure Clinic Note   PCP: Dr. Royanne Foots HF Cardiology: Dr. Shirlee Latch  83 yo with chronic diastolic CHF, chronic atrial fibrillation, and prominent exertional dyspnea presents for followup of pulmonary hypertension and CHF.  She has had atrial fibrillation long-term and has failed Multaq and propafenone use.  She is in atrial fibrillation chronically now.  In 1/16, she developed PNA.  She had a parapneumonic effusion that required 4 thoracenteses.  Eventually, she had VATS with decortication and a wedge resection.  Cytology was negative from the pleural fluid.  Last chest CT was at [facility] in 8/16 and showed no PE, mild interstitial edema.  Last echo was done in 11/16, showing normal LV EF and apparently normal RV EF.  PA systolic pressure estimation was moderate to severely increased.  I took her for RHC in 12/16, this showed moderate pulmonary arterial hypertension.  PFTs showed a restrictive pattern concerning for interstitial lung disease.  V/Q scan in 12/16 was not suggestive of chronic PE.  Despite abnormal PFTs, high resolution CT did not show interstitial lung disease.    At a prior appointment, started her on Opsumit. She developed a cough and mouth soreness with this medication and had to stop it after about 7 days. Symptoms resolved when she stopped it. Tried to get Adcirca for her. This was denied by her insurance company, so she was started on Revatio 20 mg tid instead. She thinks that this has helped her breathing "a little." She saw a pulmonary specialist who thought dyspnea was likely due to [facility]. Next, tried her on ambrisentan, but she developed facial swelling on ambrisentan (significant around the eyes). She also developed worsening dyspnea. She stopped ambrisentan and the swelling resolved but she was noted to be volume overloaded. Switched her Lasix to torsemide.  She was  started on selexipag which she has tolerated.   Lately, she has been doing well.  Minimal lightheadedness/dizziness.  She is doing a lot of volunteering at her church.  No dyspnea walking on flat ground.  No chest pain.    Labs (9/16): K 4, creatinine 1.31, BNP 466 Labs (11/16): BNP 577 Labs (12/16); K 4, creatinine 1.3, normal rheumatoid factor and scleroderma serologies Labs (1/17): ANA positive but only 1:80 titer, dsDNA negative, SSA/B negative, CCP negative.  Labs (2/17): K 4.1, creatinine 1.48 => 1.42, BNP 487 Labs (3/17): K 4.2, creatinine 1.58, BNP 326 Labs (7/17): K 3.3, creatinine 1.7, hgb 10.7 Labs (8/17): K 3.9, creatinine 1.87, HCT 35.6 Labs (1/18): hgb 9.9, creatinine 1.42 Labs (4/18): TSH normal, hgb 10.5, K 3.8, creatinine 1.34 Labs (6/18): BNP 415, K 4.9, creatinine 1.45  PMH: 1. Cardiolite (9/16) with EF 54%, no ischemia/infarction. 2. Chronic diastolic CHF: Echo (11/16) with EF 55-60%, normal RV size and systolic function, PA systolic pressure 66 mmHg.  - Echo (7/17) with EF 60-65%, mildly dilated RV with mildly decreased systolic function, PA systolic pressure 36 mmHg.  - Echo (7/18) with EF 55-60%, mild to moderate AI, normal RV size and systolic function, PASP 43 mmHg.  3. Chronic atrial fibrillation: She has been intolerant of Multaq and propafenone.  Sotalol and flecainide have not been used due to CKD.   4. SBO: Managed conservatively. 5. CKD 6. OA: h/o THR and TKR.  7. H/o hyponatremia 8. Pneumonia with parapneumonic effusion with thoracenteses and eventual VATS and decortication.  9. Pulmonary hypertension:  Suspect Group 1 pulmonary hypertension.  RHC (12/16) with mean RA 5, PA 62/22 mean 40, mean PCWP 19, CI 2.46, PVR 5 WU.  PFTs (12/16) wiith FVC 71%, FEV1 73%, ratio 104%, TLC 71%, DLCO 53% => moderate restriction concerning for interstitial lung disease.  High resolution CT (12/16) did not show evidence for interstitial lung disease.  V/Q scan (12/16) did not  show evidence for chronic PE.  Serologies: RF negative, scleroderma antibodies negative, ANA positive but only 1:80 titer, SSA/B negative, dsDNA negative, CCP negative.  She did not tolerate macitentan or ambrisentan.  Echo 7/17 with PA systolic pressure down to 36 mmHg.   - 6 minute walk (12/16) with 244 meters - 6 minute walk (2/17) with 232 meters - 6 minute walk (3/17) with 414 meters - 6 minute walk (4/17) with 267 meters - 6 minute walk (8/17) with 241 meters - 6 minute walk (10/17) with 256 meters - 6 minute walk (6/18) with 305 meters - 6 minute walk (9/18) with 320 meters 10. Lightheadedness/syncope: Extensive workup.  Event and holter monitors as well as telemetry monitoring negative.  She has not been orthostatic.  11. Sciatica: Status post back surgery 1/18.   SH: Married, lives in [place], nonsmoker, no ETOH.   FH: Brother with rheumatic fever.   ROS: All systems reviewed and negative except as per HPI.   Current Outpatient Prescriptions  Medication Sig Dispense Refill  . gabapentin (NEURONTIN) 600 MG tablet Take 600 mg by mouth 3 (three) times daily as needed (pain).    . methocarbamol (ROBAXIN) 750 MG tablet Take 1 tablet (750 mg total) by mouth every 6 (six) hours as needed for muscle spasms. 120 tablet 2  . metoprolol succinate (TOPROL-XL) 25 MG 24 hr tablet Take 1 tablet (25 mg total) by mouth daily. 90 tablet 3  . Potassium Chloride ER 20 MEQ TBCR Take 20 mEq by mouth 2 (two) times daily. 180 tablet 3  . pyridostigmine (MESTINON) 60 MG tablet Take 0.5 tablets (30 mg total) by mouth 3 (three) times daily. 45 tablet 6  . Rivaroxaban (XARELTO) 15 MG TABS tablet Take 15 mg by mouth daily with supper.     . Selexipag (UPTRAVI) 1000 MCG TABS Take 1 tablet by mouth 2 (two) times daily. 60 tablet 6  . sildenafil (REVATIO) 20 MG tablet Take 4 tablets (80 mg total) by mouth 3 (three) times daily. 1140 tablet 6  . torsemide (DEMADEX) 20 MG tablet Take 1-2 tablets (20-40 mg  total) by mouth 2 (two) times daily. Two in the AM and one in the PM 270 tablet 3   No current facility-administered medications for this encounter.    BP 118/70   Pulse 60   Wt 143 lb 1.9 oz (64.9 kg)   SpO2 96%   BMI 22.42 kg/m   Filed Weights   09/27/16 1041  Weight: 143 lb 1.9 oz (64.9 kg)    General: NAD Neck: No JVD, no thyromegaly or thyroid nodule.  Lungs: Clear to auscultation bilaterally with normal respiratory effort. CV: Nondisplaced PMI.  Heart irregular S1/S2, no S3/S4, 1/6 HSM LLSB.  No peripheral edema.  No carotid bruit.  Normal pedal pulses.  Abdomen: Soft, nontender, no hepatosplenomegaly, no distention.  Skin: Intact without lesions or rashes.  Neurologic: Alert and oriented x 3.  Psych: Normal affect. Extremities: No clubbing or cyanosis.  HEENT: Normal.   Assessment/Plan:  1. Chronic diastolic CHF:  NYHA class II-III symptoms.  Overall stable. - Continue current torsemide,  40 qam/20 qpm.  BMET today.  2. Pulmonary hypertension: Moderate to severe pulmonary hypertension by echo (PASP 66 mmHg). RHC showed moderate pulmonary arterial hypertension with PVR 5 WU.  PFTs with restrictive changes suggestive of interstitial process but high resolution CT did not show interstitial lung disease.  No evidence for chronic PE on V/Q scan.  Rheumatologic serologies all negative.  Possible group 1 pulmonary hypertension.  She was unable to tolerate macitentan or ambrisentan.  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  She is now on Revatio 60 mg tid and selexipag 1000 mcg bid, unable to increase selexipag further due to symptoms.  PA systolic pressure was down to 43 mmHg on most recent echo from 66 mmHg prior.  - Continue Selexipag at 1000 mcg bid, unable to tolerate uptitration of Selexipag any further.  - Increase Revatio to 80 mg three times a day.  - today => incrementally improved.    3. Pulmonary: s/p PNA with parapneumonic effusion and eventual VATS with decortication.   As above, restrictive PFTs but no ILD on high resolution CT.  4. Atrial fibrillation: Chronic.  For now, planning rate control/anticoagulation strategy.   - Continue Toprol XL. - Continue Xarelto 15 mg daily, no bleeding problems. CBC today.    5. Syncope/lightheadedness: Has had syncopal episodes for at least 5 years now.  They always occur when standing, but not immediately after standing.  Currently, no syncopal episodes but she has noted dizziness when standing.  She has not been orthostatic when checked at past appointments or today.  No arrhythmia has been found, she has worn monitors in the past and was on telemetry in the hospital => had syncope at [facility] in 7/17 but no events on telemetry.  She has been seen by neurology. ?vasovagal.   - Continue pyridostigmine 30 mg tid => this seems to have helped her symptoms, no recent presyncope/syncope.    - Continue current dose to Toprol XL at bed time  Followup in 3 months.   [name] 09/27/2016

## 2016-09-27 NOTE — Patient Instructions (Signed)
Increase Sildenafil 80 mg (4 tabs), three times a day  Labs drawn today (if we do not call you, then your lab work was stable)   Your physician recommends that you schedule a follow-up appointment in: 3 months

## 2016-09-27 NOTE — Progress Notes (Signed)
6 minute walk test completed.  Pt ambulated 1050 ft (320 m) HR ranged 84-89, O2 sats ranged 91-98% on RA

## 2016-09-28 ENCOUNTER — Telehealth (HOSPITAL_COMMUNITY): Payer: Self-pay | Admitting: Vascular Surgery

## 2016-09-28 NOTE — Telephone Encounter (Signed)
Left pt message , there was an error in scheduling pt appt, called pt to give new appt date and time org appt 12/30/16 @ 11:20 , change appt 01/03/17 @ 11:20, asked pt to call back to confirm she received message

## 2016-11-01 ENCOUNTER — Other Ambulatory Visit (HOSPITAL_COMMUNITY): Payer: Self-pay | Admitting: *Deleted

## 2016-11-01 MED ORDER — TORSEMIDE 20 MG PO TABS: ORAL_TABLET | ORAL | 3 refills | Status: DC

## 2016-12-30 ENCOUNTER — Encounter (HOSPITAL_COMMUNITY): Payer: Self-pay | Admitting: Internal Medicine

## 2017-01-03 ENCOUNTER — Encounter (HOSPITAL_COMMUNITY): Payer: Self-pay | Admitting: Cardiology

## 2017-01-25 ENCOUNTER — Other Ambulatory Visit (HOSPITAL_COMMUNITY): Payer: Self-pay | Admitting: Pharmacist

## 2017-01-25 MED ORDER — SELEXIPAG 1000 MCG PO TABS: 1.0000 | ORAL_TABLET | Freq: Two times a day (BID) | ORAL | 11 refills | Status: DC

## 2017-02-01 ENCOUNTER — Ambulatory Visit (HOSPITAL_COMMUNITY)
Admission: RE | Admit: 2017-02-01 | Discharge: 2017-02-01 | Disposition: A | Discharge status: Home / Self Care | Payer: Medicare Other | Source: Ambulatory Visit | Attending: Internal Medicine | Admitting: Internal Medicine

## 2017-02-01 ENCOUNTER — Other Ambulatory Visit: Payer: Self-pay

## 2017-02-01 ENCOUNTER — Encounter (HOSPITAL_COMMUNITY): Payer: Self-pay | Admitting: Cardiology

## 2017-02-01 VITALS — BP 166/89 | HR 76 | Wt 139.5 lb

## 2017-02-01 DIAGNOSIS — Z79899 Other long term (current) drug therapy: Secondary | ICD-10-CM | POA: Diagnosis not present

## 2017-02-01 DIAGNOSIS — Z7901 Long term (current) use of anticoagulants: Secondary | ICD-10-CM | POA: Insufficient documentation

## 2017-02-01 DIAGNOSIS — I5032 Chronic diastolic (congestive) heart failure: Secondary | ICD-10-CM | POA: Diagnosis present

## 2017-02-01 DIAGNOSIS — I272 Pulmonary hypertension, unspecified: Secondary | ICD-10-CM | POA: Diagnosis not present

## 2017-02-01 DIAGNOSIS — N189 Chronic kidney disease, unspecified: Secondary | ICD-10-CM | POA: Insufficient documentation

## 2017-02-01 DIAGNOSIS — K56609 Unspecified intestinal obstruction, unspecified as to partial versus complete obstruction: Secondary | ICD-10-CM | POA: Diagnosis not present

## 2017-02-01 DIAGNOSIS — I482 Chronic atrial fibrillation, unspecified: Secondary | ICD-10-CM

## 2017-02-01 DIAGNOSIS — I2721 Secondary pulmonary arterial hypertension: Secondary | ICD-10-CM | POA: Insufficient documentation

## 2017-02-01 LAB — CBC
HCT: 35.3 % — ABNORMAL LOW (ref 36.0–46.0)
Hemoglobin: 11.3 g/dL — ABNORMAL LOW (ref 12.0–15.0)
MCH: 28.7 pg (ref 26.0–34.0)
MCHC: 32 g/dL (ref 30.0–36.0)
MCV: 89.6 fL (ref 78.0–100.0)
PLATELETS: 238 10*3/uL (ref 150–400)
RBC: 3.94 MIL/uL (ref 3.87–5.11)
RDW: 14.6 % (ref 11.5–15.5)
WBC: 6.7 10*3/uL (ref 4.0–10.5)

## 2017-02-01 LAB — BASIC METABOLIC PANEL
Anion gap: 11 (ref 5–15)
BUN: 21 mg/dL — ABNORMAL HIGH (ref 6–20)
CALCIUM: 9.3 mg/dL (ref 8.9–10.3)
CO2: 27 mmol/L (ref 22–32)
CREATININE: 1.56 mg/dL — AB (ref 0.44–1.00)
Chloride: 100 mmol/L — ABNORMAL LOW (ref 101–111)
GFR, EST AFRICAN AMERICAN: 34 mL/min — AB (ref >60)
GFR, EST NON AFRICAN AMERICAN: 29 mL/min — AB (ref >60)
Glucose, Bld: 101 mg/dL — ABNORMAL HIGH (ref 65–99)
Potassium: 4.4 mmol/L (ref 3.5–5.1)
SODIUM: 138 mmol/L (ref 135–145)

## 2017-02-01 NOTE — Progress Notes (Signed)
Patient ID: Rachael Myers, female   DOB: 04-Feb-1929, 84 y.o.   MRN: [id number]    Advanced Heart Failure Clinic Note   PCP: Dr. Royanne Footsichard Orr HF Cardiology: Dr. Shirlee LatchMcLean  84 yo with chronic diastolic CHF, chronic atrial fibrillation, and prominent exertional dyspnea presents for followup of pulmonary hypertension and CHF.  She has had atrial fibrillation long-term and has failed Multaq and propafenone use.  She is in atrial fibrillation chronically now.  In 1/16, she developed PNA.  She had a parapneumonic effusion that required 4 thoracenteses.  Eventually, she had VATS with decortication and a wedge resection.  Cytology was negative from the pleural fluid.  [facility] and showed no PE, mild interstitial edema.  Last echo was done in 11/16, showing normal LV EF and apparently normal RV EF.  PA systolic pressure estimation was moderate to severely increased.  I took her for RHC in 12/16, this showed moderate pulmonary arterial hypertension.  PFTs showed a restrictive pattern concerning for interstitial lung disease.  V/Q scan in 12/16 was not suggestive of chronic PE.  Despite abnormal PFTs, high resolution CT did not show interstitial lung disease.    At a prior appointment, started her on Opsumit. She developed a cough and mouth soreness with this medication and had to stop it after about 7 days. Symptoms resolved when she stopped it. Tried to get Adcirca for her. This was denied by her insurance company, so she was started on Revatio 20 mg tid instead. She thinks that this has helped her breathing "a little." She saw a pulmonary specialist who [facility]. Next, tried her on ambrisentan, but she developed facial swelling on ambrisentan (significant around the eyes). She also developed worsening dyspnea. She stopped ambrisentan and the swelling resolved but she was noted to be volume overloaded. Switched her Lasix to torsemide.  She was  started on selexipag which she has tolerated.   Since last appointment, weight down 4 lbs.  She is taking care of her husband who recently had orthopedic surgery and then a post-op CVA.  No dyspnea walking on flat ground.  Able to do all ADLs, clean house, etc.  No syncope.  She has had periodic episodes of lightheadedness.  She has had these for a long time.  However, some of the episodes seem to be related to head position (when she lies back in bed at night) and involve a spinning sensation rather than lightheadedness.  No falls.  BP high in the office today but SBP < 140 whenever she checks at home.   6 minute walk today less than in the past, but she says that she was hampered by the "wrong shoes. "    Labs (9/16): K 4, creatinine 1.31, BNP 466 Labs (11/16): BNP 577 Labs (12/16); K 4, creatinine 1.3, normal rheumatoid factor and scleroderma serologies Labs (1/17): ANA positive but only 1:80 titer, dsDNA negative, SSA/B negative, CCP negative.  Labs (2/17): K 4.1, creatinine 1.48 => 1.42, BNP 487 Labs (3/17): K 4.2, creatinine 1.58, BNP 326 Labs (7/17): K 3.3, creatinine 1.7, hgb 10.7 Labs (8/17): K 3.9, creatinine 1.87, HCT 35.6 Labs (1/18): hgb 9.9, creatinine 1.42 Labs (4/18): TSH normal, hgb 10.5, K 3.8, creatinine 1.34 Labs (6/18): BNP 415, K 4.9, creatinine 1.45  PMH: 1. Cardiolite (9/16) with EF 54%, no ischemia/infarction. 2. Chronic diastolic CHF: Echo (11/16) with EF 55-60%, normal RV size and systolic function, PA systolic  pressure 66 mmHg.  - Echo (7/17) with EF 60-65%, mildly dilated RV with mildly decreased systolic function, PA systolic pressure 36 mmHg.  - Echo (7/18) with EF 55-60%, mild to moderate AI, normal RV size and systolic function, PASP 43 mmHg.  3. Chronic atrial fibrillation: She has been intolerant of Multaq and propafenone.  Sotalol and flecainide have not been used due to CKD.   4. SBO: Managed conservatively. 5. CKD 6. OA: h/o THR and TKR.  7. H/o  hyponatremia 8. Pneumonia with parapneumonic effusion with thoracenteses and eventual VATS and decortication.  9. Pulmonary hypertension: Suspect Group 1 pulmonary hypertension.  RHC (12/16) with mean RA 5, PA 62/22 mean 40, mean PCWP 19, CI 2.46, PVR 5 WU.  PFTs (12/16) wiith FVC 71%, FEV1 73%, ratio 104%, TLC 71%, DLCO 53% => moderate restriction concerning for interstitial lung disease.  High resolution CT (12/16) did not show evidence for interstitial lung disease.  V/Q scan (12/16) did not show evidence for chronic PE.  Serologies: RF negative, scleroderma antibodies negative, ANA positive but only 1:80 titer, SSA/B negative, dsDNA negative, CCP negative.  She did not tolerate macitentan or ambrisentan.  Echo 7/17 with PA systolic pressure down to 36 mmHg.   - 6 minute walk (12/16) with 244 meters - 6 minute walk (2/17) with 232 meters - 6 minute walk (3/17) with 414 meters - 6 minute walk (4/17) with 267 meters - 6 minute walk (8/17) with 241 meters - 6 minute walk (10/17) with 256 meters - 6 minute walk (6/18) with 305 meters - 6 minute walk (9/18) with 320 meters - 6 minute walk (1/19) with 213 meters 10. Lightheadedness/syncope: Extensive workup.  Event and holter monitors as well as telemetry monitoring negative.  She has not been orthostatic.  11. Sciatica: Status post back surgery 1/18.   SH: Married, lives in [place], nonsmoker, no ETOH.   FH: Brother with rheumatic fever.   ROS: All systems reviewed and negative except as per HPI.   Current Outpatient Medications  Medication Sig Dispense Refill  . gabapentin (NEURONTIN) 600 MG tablet Take 600 mg by mouth 3 (three) times daily as needed (pain).    . methocarbamol (ROBAXIN) 750 MG tablet Take 1 tablet (750 mg total) by mouth every 6 (six) hours as needed for muscle spasms. 120 tablet 2  . metoprolol succinate (TOPROL-XL) 25 MG 24 hr tablet Take 1 tablet (25 mg total) by mouth daily. 90 tablet 3  . Potassium Chloride ER 20  MEQ TBCR Take 20 mEq by mouth 2 (two) times daily. 180 tablet 3  . pyridostigmine (MESTINON) 60 MG tablet Take 0.5 tablets (30 mg total) by mouth 3 (three) times daily. 45 tablet 6  . Rivaroxaban (XARELTO) 15 MG TABS tablet Take 15 mg by mouth daily with supper.     . Selexipag (UPTRAVI) 1000 MCG TABS Take 1 tablet by mouth 2 (two) times daily. 60 tablet 11  . sildenafil (REVATIO) 20 MG tablet Take 4 tablets (80 mg total) by mouth 3 (three) times daily. 1140 tablet 6  . torsemide (DEMADEX) 20 MG tablet Two in the AM and one in the PM 270 tablet 3   No current facility-administered medications for this encounter.    BP (!) 166/89   Pulse 76   Wt 139 lb 8 oz (63.3 kg)   SpO2 99%   BMI 21.85 kg/m   Filed Weights   02/01/17 1108  Weight: 139 lb 8 oz (63.3 kg)   General:  NAD Neck: No JVD, no thyromegaly or thyroid nodule.  Lungs: Clear to auscultation bilaterally with normal respiratory effort. CV: Nondisplaced PMI.  Heart regular S1/S2, no S3/S4, 1/6 HSM LLSB.  No peripheral edema.  No carotid bruit.  Normal pedal pulses.  Abdomen: Soft, nontender, no hepatosplenomegaly, no distention.  Skin: Intact without lesions or rashes.  Neurologic: Alert and oriented x 3.  Psych: Normal affect. Extremities: No clubbing or cyanosis.  HEENT: Normal.   Assessment/Plan:  1. Chronic diastolic CHF:  NYHA class II symptoms.  Overall stable. - Continue current torsemide, 40 qam/20 qpm.  BMET today.  2. Pulmonary hypertension: Moderate to severe pulmonary hypertension by echo (PASP 66 mmHg). RHC showed moderate pulmonary arterial hypertension with PVR 5 WU.  PFTs with restrictive changes suggestive of interstitial process but high resolution CT did not show interstitial lung disease.  No evidence for chronic PE on V/Q scan.  Rheumatologic serologies all negative.  Possible group 1 pulmonary hypertension.  She was unable to tolerate macitentan or ambrisentan.  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  She is  now on Revatio 60 mg tid and selexipag 1000 mcg bid, unable to increase selexipag further due to symptoms.  PA systolic pressure was down to 43 mmHg on most recent echo in 7/18 from 66 mmHg prior.  - Continue Selexipag at 1000 mcg bid, unable to tolerate uptitration of Selexipag any further.  - Continue sildenafil 80 mg three times a day.  - today => worse than prior but was limited by her shoes.  Symptomatically she seems to be doing well.    3. Pulmonary: s/p PNA with parapneumonic effusion and eventual VATS with decortication.  As above, restrictive PFTs but no ILD on high resolution CT.  4. Atrial fibrillation: Chronic.  For now, planning rate control/anticoagulation strategy.   - Continue Toprol XL. - Continue Xarelto 15 mg daily, no bleeding problems. CBC today.    5. Syncope/lightheadedness: Has had syncopal episodes for at least 5 years now.  They always occur when standing, but not immediately after standing.  Currently, no syncopal episodes but she still has "dizzy spells."  She has not been orthostatic when checked at past appointments.  No arrhythmia has been found, she has worn monitors in the past and was on telemetry in the hospital => had syncope at [facility] in 7/17 but no events on telemetry.  She has been seen by neurology. ?vasovagal.  Some of her recent symptoms seem more like positional vertigo.  - Continue pyridostigmine 30 mg tid => this seems to have helped her symptoms, no recent presyncope/syncope.    - Continue current dose to Toprol XL at bed time - Would have her try prn meclizine for the vertigo-type symptoms.   Followup in 3 months.   [name] 02/01/2017

## 2017-02-01 NOTE — Patient Instructions (Addendum)
Sttart Meclizine 25 mg 91 tab), twice a day (over the counter)   Labs drawn today (if we do not call you, then your lab work was stable)   Your physician recommends that you schedule a follow-up appointment in: 3 months with Dr. Shirlee LatchMcLean

## 2017-02-03 NOTE — Progress Notes (Signed)
Pt completed 6 minute walk test. Pt walked 600 ft (183 meters). O2 sats ranged from 97%-86%, and HR ranged from 92-99. Information given to Dr. Shirlee LatchMcLean  to review. No new orders placed.

## 2017-02-03 NOTE — Addendum Note (Signed)
Encounter addended by: Teresa CoombsGunter, Mikaele Stecher, RN on: 02/03/2017 10:03 AM  Actions taken: Sign clinical note

## 2017-02-04 ENCOUNTER — Encounter (HOSPITAL_COMMUNITY): Payer: Self-pay | Admitting: Cardiology

## 2017-04-01 ENCOUNTER — Other Ambulatory Visit (HOSPITAL_COMMUNITY): Payer: Self-pay | Admitting: Internal Medicine

## 2017-05-03 ENCOUNTER — Encounter (HOSPITAL_COMMUNITY): Payer: Self-pay | Admitting: Cardiology

## 2017-05-03 ENCOUNTER — Other Ambulatory Visit: Payer: Self-pay

## 2017-05-03 ENCOUNTER — Ambulatory Visit (HOSPITAL_COMMUNITY)
Admission: RE | Admit: 2017-05-03 | Discharge: 2017-05-03 | Disposition: A | Discharge status: Home / Self Care | Payer: Medicare Other | Source: Ambulatory Visit | Attending: Cardiology | Admitting: Cardiology

## 2017-05-03 VITALS — BP 155/72 | HR 75 | Wt 136.8 lb

## 2017-05-03 DIAGNOSIS — R55 Syncope and collapse: Secondary | ICD-10-CM | POA: Diagnosis not present

## 2017-05-03 DIAGNOSIS — N183 Chronic kidney disease, stage 3 unspecified: Secondary | ICD-10-CM

## 2017-05-03 DIAGNOSIS — M79605 Pain in left leg: Secondary | ICD-10-CM | POA: Diagnosis not present

## 2017-05-03 DIAGNOSIS — Z79899 Other long term (current) drug therapy: Secondary | ICD-10-CM | POA: Insufficient documentation

## 2017-05-03 DIAGNOSIS — I482 Chronic atrial fibrillation, unspecified: Secondary | ICD-10-CM

## 2017-05-03 DIAGNOSIS — M25551 Pain in right hip: Secondary | ICD-10-CM | POA: Insufficient documentation

## 2017-05-03 DIAGNOSIS — M25552 Pain in left hip: Secondary | ICD-10-CM | POA: Diagnosis not present

## 2017-05-03 DIAGNOSIS — I2721 Secondary pulmonary arterial hypertension: Secondary | ICD-10-CM | POA: Diagnosis not present

## 2017-05-03 DIAGNOSIS — I5032 Chronic diastolic (congestive) heart failure: Secondary | ICD-10-CM | POA: Insufficient documentation

## 2017-05-03 DIAGNOSIS — M79604 Pain in right leg: Secondary | ICD-10-CM | POA: Insufficient documentation

## 2017-05-03 DIAGNOSIS — Z7901 Long term (current) use of anticoagulants: Secondary | ICD-10-CM | POA: Diagnosis not present

## 2017-05-03 DIAGNOSIS — R42 Dizziness and giddiness: Secondary | ICD-10-CM | POA: Diagnosis not present

## 2017-05-03 LAB — BASIC METABOLIC PANEL
Anion gap: 10 (ref 5–15)
BUN: 22 mg/dL — ABNORMAL HIGH (ref 6–20)
CALCIUM: 9.4 mg/dL (ref 8.9–10.3)
CO2: 29 mmol/L (ref 22–32)
CREATININE: 1.45 mg/dL — AB (ref 0.44–1.00)
Chloride: 100 mmol/L — ABNORMAL LOW (ref 101–111)
GFR calc non Af Amer: 32 mL/min — ABNORMAL LOW (ref >60)
GFR, EST AFRICAN AMERICAN: 37 mL/min — AB (ref >60)
Glucose, Bld: 89 mg/dL (ref 65–99)
Potassium: 3.9 mmol/L (ref 3.5–5.1)
Sodium: 139 mmol/L (ref 135–145)

## 2017-05-03 NOTE — Patient Instructions (Signed)
Labs drawn today (if we do not call you, then your lab work was stable)   Your physician recommends that you schedule a follow-up appointment in: 3 months with Dr. McLean    

## 2017-05-03 NOTE — Progress Notes (Signed)
Patient ID: Rachael Myers, female   DOB: July 06, 1929, 84 y.o.   MRN: [id number]    Advanced Heart Failure Clinic Note   PCP: Dr. Royanne Foots HF Cardiology: Dr. Shirlee Latch  84 yo with chronic diastolic CHF, chronic atrial fibrillation, and prominent exertional dyspnea presents for followup of pulmonary hypertension and CHF.  She has had atrial fibrillation long-term and has failed Multaq and propafenone use.  She is in atrial fibrillation chronically now.  In 1/16, she developed PNA.  She had a parapneumonic effusion that required 4 thoracenteses.  Eventually, she had VATS with decortication and a wedge resection.  Cytology was negative from the pleural fluid.  Last chest CT was at [facility] in 8/16 and showed no PE, mild interstitial edema.  Last echo was done in 11/16, showing normal LV EF and apparently normal RV EF.  PA systolic pressure estimation was moderate to severely increased.  I took her for RHC in 12/16, this showed moderate pulmonary arterial hypertension.  PFTs showed a restrictive pattern concerning for interstitial lung disease.  V/Q scan in 12/16 was not suggestive of chronic PE.  Despite abnormal PFTs, high resolution CT did not show interstitial lung disease.    At a prior appointment, started her on Opsumit. She developed a cough and mouth soreness with this medication and had to stop it after about 7 days. Symptoms resolved when she stopped it. Tried to get Adcirca for her. This was denied by her insurance company, so she was started on Revatio 20 mg tid instead. She thinks that this has helped her breathing "a little." She saw a pulmonary specialist who thought dyspnea was likely due to [facility]. Next, tried her on ambrisentan, but she developed facial swelling on ambrisentan (significant around the eyes). She also developed worsening dyspnea. She stopped ambrisentan and the swelling resolved but she was noted to be volume overloaded. Switched her Lasix to torsemide.  She was  started on selexipag which she has tolerated.   Weight is down 3 lbs today.  She continues to seem to do fairly well.  No significant dyspnea walking on flat ground. No chest pain. Able to walk up a flight of steps. Does most of the housework (husband has developed health problems).  No BRBPR/melena. BP high in the office today, but SBP < 140 when she checks at home (does so regularly).  She has bilateral hip pain with radiation down her legs bilaterally, worse with exertion.  She is not getting lightheaded spells like she did in the past.  No syncope recently.   Labs (9/16): K 4, creatinine 1.31, BNP 466 Labs (11/16): BNP 577 Labs (12/16); K 4, creatinine 1.3, normal rheumatoid factor and scleroderma serologies Labs (1/17): ANA positive but only 1:80 titer, dsDNA negative, SSA/B negative, CCP negative.  Labs (2/17): K 4.1, creatinine 1.48 => 1.42, BNP 487 Labs (3/17): K 4.2, creatinine 1.58, BNP 326 Labs (7/17): K 3.3, creatinine 1.7, hgb 10.7 Labs (8/17): K 3.9, creatinine 1.87, HCT 35.6 Labs (1/18): hgb 9.9, creatinine 1.42 Labs (4/18): TSH normal, hgb 10.5, K 3.8, creatinine 1.34 Labs (6/18): BNP 415, K 4.9, creatinine 1.45 Labs (1/19): K 4.4, creatinine 1.56, hgb 11.3  PMH: 1. Cardiolite (9/16) with EF 54%, no ischemia/infarction. 2. Chronic diastolic CHF: Echo (11/16) with EF 55-60%, normal RV size and systolic function, PA systolic pressure 66 mmHg.  - Echo (7/17) with EF 60-65%, mildly dilated RV with mildly decreased systolic function, PA systolic pressure 36 mmHg.  - Echo (7/18) with  EF 55-60%, mild to moderate AI, normal RV size and systolic function, PASP 43 mmHg.  3. Chronic atrial fibrillation: She has been intolerant of Multaq and propafenone.  Sotalol and flecainide have not been used due to CKD.   4. SBO: Managed conservatively. 5. CKD 6. OA: h/o THR and TKR.  7. H/o hyponatremia 8. Pneumonia with parapneumonic effusion with thoracenteses and eventual VATS and  decortication.  9. Pulmonary hypertension: Suspect Group 1 pulmonary hypertension.  RHC (12/16) with mean RA 5, PA 62/22 mean 40, mean PCWP 19, CI 2.46, PVR 5 WU.  PFTs (12/16) wiith FVC 71%, FEV1 73%, ratio 104%, TLC 71%, DLCO 53% => moderate restriction concerning for interstitial lung disease.  High resolution CT (12/16) did not show evidence for interstitial lung disease.  V/Q scan (12/16) did not show evidence for chronic PE.  Serologies: RF negative, scleroderma antibodies negative, ANA positive but only 1:80 titer, SSA/B negative, dsDNA negative, CCP negative.  She did not tolerate macitentan or ambrisentan.  Echo 7/17 with PA systolic pressure down to 36 mmHg.   - 6 minute walk (12/16) with 244 meters - 6 minute walk (2/17) with 232 meters - 6 minute walk (3/17) with 414 meters - 6 minute walk (4/17) with 267 meters - 6 minute walk (8/17) with 241 meters - 6 minute walk (10/17) with 256 meters - 6 minute walk (6/18) with 305 meters - 6 minute walk (9/18) with 320 meters - 6 minute walk (1/19) with 213 meters 10. Lightheadedness/syncope: Extensive workup.  Event and holter monitors as well as telemetry monitoring negative.  She has not been orthostatic.  11. Sciatica: Status post back surgery 1/18.   SH: Married, lives in [place], nonsmoker, no ETOH.   FH: Brother with rheumatic fever.   ROS: All systems reviewed and negative except as per HPI.   Current Outpatient Medications  Medication Sig Dispense Refill  . gabapentin (NEURONTIN) 600 MG tablet Take 600 mg by mouth 3 (three) times daily as needed (pain).    . methocarbamol (ROBAXIN) 750 MG tablet Take 1 tablet (750 mg total) by mouth every 6 (six) hours as needed for muscle spasms. 120 tablet 2  . metoprolol succinate (TOPROL-XL) 25 MG 24 hr tablet TAKE 1 TABLET DAILY (CHANGE IN DOSAGE ) 90 tablet 3  . Potassium Chloride ER 20 MEQ TBCR Take 20 mEq by mouth 2 (two) times daily. 180 tablet 3  . pyridostigmine (MESTINON) 60 MG  tablet Take 0.5 tablets (30 mg total) by mouth 3 (three) times daily. 45 tablet 6  . Rivaroxaban (XARELTO) 15 MG TABS tablet Take 15 mg by mouth daily with supper.     . Selexipag (UPTRAVI) 1000 MCG TABS Take 1 tablet by mouth 2 (two) times daily. 60 tablet 11  . sildenafil (REVATIO) 20 MG tablet Take 4 tablets (80 mg total) by mouth 3 (three) times daily. 1140 tablet 6  . torsemide (DEMADEX) 20 MG tablet Two in the AM and one in the PM 270 tablet 3   No current facility-administered medications for this encounter.    BP (!) 155/72   Pulse 75   Wt 136 lb 12 oz (62 kg)   SpO2 100%   BMI 21.42 kg/m   Filed Weights   05/03/17 1108  Weight: 136 lb 12 oz (62 kg)   General: NAD Neck: No JVD, no thyromegaly or thyroid nodule.  Lungs: Clear to auscultation bilaterally with normal respiratory effort. CV: Nondisplaced PMI.  Heart irregular S1/S2, no S3/S4, 1/6  HSM LLSB.  No peripheral edema.  No carotid bruit.  Difficult to palpate pedal pulses.  Abdomen: Soft, nontender, no hepatosplenomegaly, no distention.  Skin: Intact without lesions or rashes.  Neurologic: Alert and oriented x 3.  Psych: Normal affect. Extremities: No clubbing or cyanosis.  HEENT: Normal.   Assessment/Plan:  1. Chronic diastolic CHF:  NYHA class II symptoms.  Overall stable. - Continue current torsemide, 40 qam/20 qpm.  BMET today.  2. Pulmonary hypertension: Moderate to severe pulmonary hypertension by echo (PASP 66 mmHg). RHC showed moderate pulmonary arterial hypertension with PVR 5 WU.  PFTs with restrictive changes suggestive of interstitial process but high resolution CT did not show interstitial lung disease.  No evidence for chronic PE on V/Q scan.  Rheumatologic serologies all negative.  Possible group 1 pulmonary hypertension.  She was unable to tolerate macitentan or ambrisentan.  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  She is now on Revatio 60 mg tid and selexipag 1000 mcg bid, unable to increase selexipag  further due to symptoms.  PA systolic pressure was down to 43 mmHg on most recent echo in 7/18 from 66 mmHg prior.  - Continue Selexipag at 1000 mcg bid, unable to tolerate uptitration of Selexipag any further.  - Continue sildenafil 80 mg three times a day.  - She asks that we defer 6 minute walk today due to leg/hip pain.   3. Pulmonary: s/p PNA with parapneumonic effusion and eventual VATS with decortication.  As above, restrictive PFTs but no ILD on high resolution CT.  4. Atrial fibrillation: Chronic.  For now, planning rate control/anticoagulation strategy.   - Continue Toprol XL. - Continue Xarelto 15 mg daily, no bleeding problems.  5. Syncope/lightheadedness: Has had syncopal episodes for > 5 years now.  They always occur when standing, but not immediately after standing.  She has not been orthostatic when checked at past appointments.  No arrhythmia has been found, she has worn monitors in the past and was on telemetry in the hospital => had syncope at [facility] in 7/17 but no events on telemetry.  She has been seen by neurology. ?vasovagal.  Some of her recent symptoms seem more like positional vertigo.  Lightheadedness has been improved in recent months and she has not had any further syncope.  - Continue pyridostigmine 30 mg tid => this seems to have helped her symptoms, no recent presyncope/syncope.    - Continue current dose to Toprol XL at bed time 6. Leg/hip pain: This may be related to sciatica, but has some association with exertion and pedal pulses are difficult to palpate.  I will order peripheral arterial dopplers to evaluate for PAD.  7. CKD: Stage 3.  BMET today.   Followup in 3 months.   [name] 05/03/2017

## 2017-05-25 ENCOUNTER — Other Ambulatory Visit (HOSPITAL_COMMUNITY): Payer: Self-pay | Admitting: *Deleted

## 2017-05-25 MED ORDER — TORSEMIDE 20 MG PO TABS: ORAL_TABLET | ORAL | 3 refills | Status: DC

## 2017-07-25 IMAGING — CT CT CHEST HIGH RESOLUTION W/O CM
3 of 7 series · 15 of 36 positions shown, 17 images · non-contrast
Comparison: 12/25/2014 chest radiograph.

CLINICAL DATA: Pulmonary hypertension. Restrictive pulmonary
function tests. Chronic diastolic congestive heart failure and
atrial fibrillation. History of left pneumonia complicated by
parapneumonic effusion requiring VATS with decortication and wedge
resection.

EXAM:
CT CHEST WITHOUT CONTRAST
TECHNIQUE: Multidetector CT imaging of the chest was performed following the
standard protocol without intravenous contrast. High resolution
imaging of the lungs, as well as inspiratory and expiratory imaging,
was performed.

[Series 2: chest w/o 5mm st · axial · non-contrast · 0.69mm/px · z∈[+1029,+1264]mm · 7 of 63 slices shown, 9 images]
[im 8/63  mediastinal]
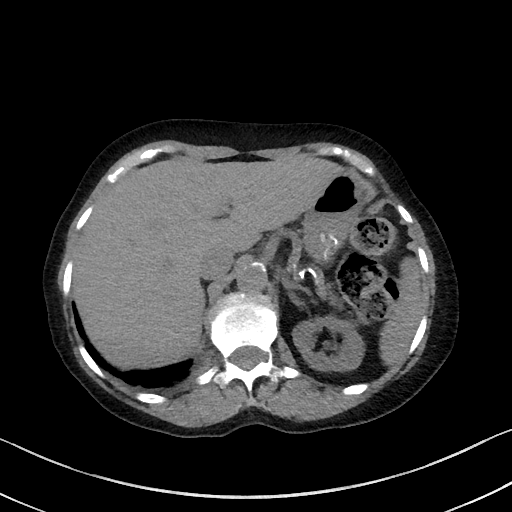
[im 8/63  lung]
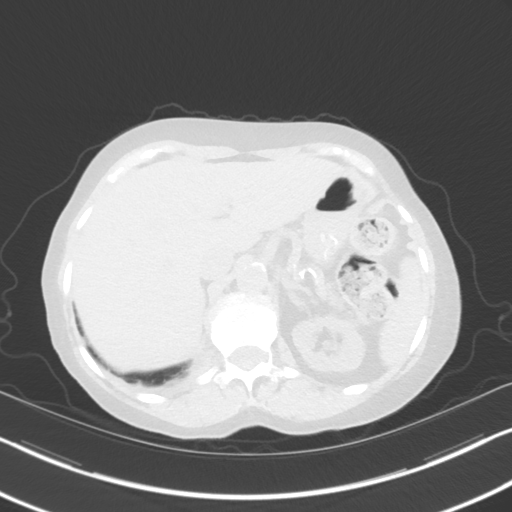
[im 16/63  lung]
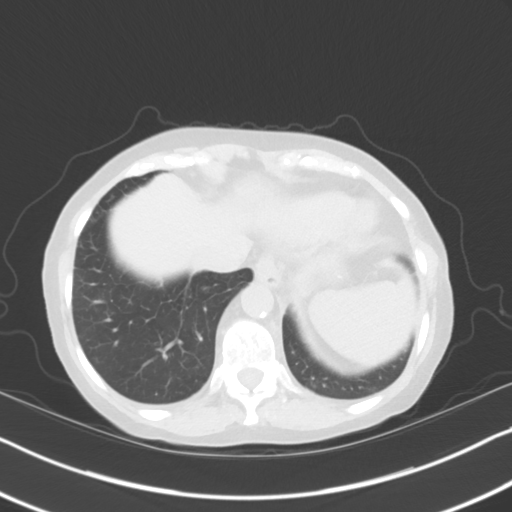
[im 24/63  lung]
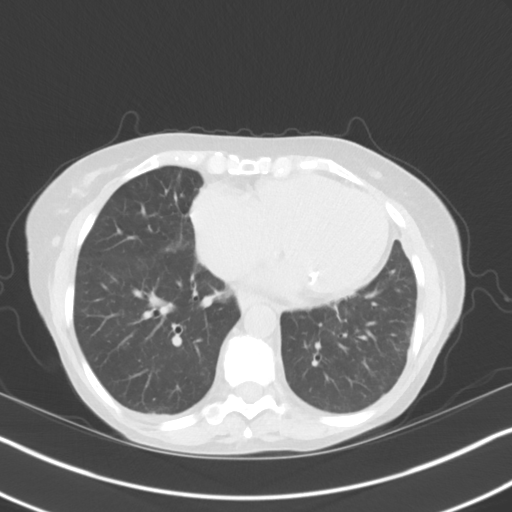
[im 32/63  lung]
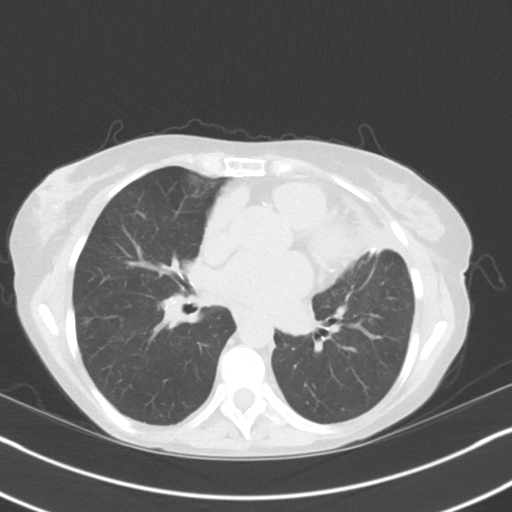
[im 39/63  mediastinal]
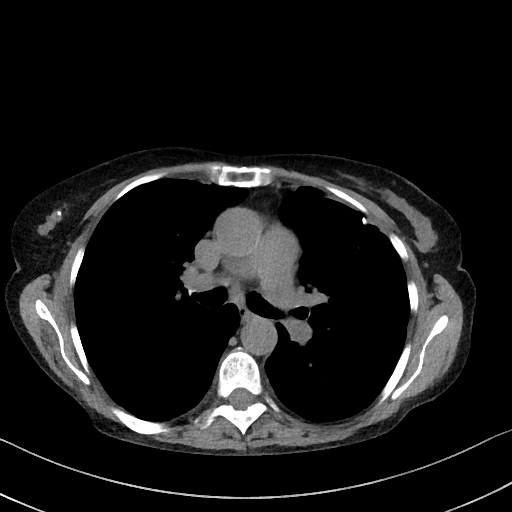
[im 39/63  lung]
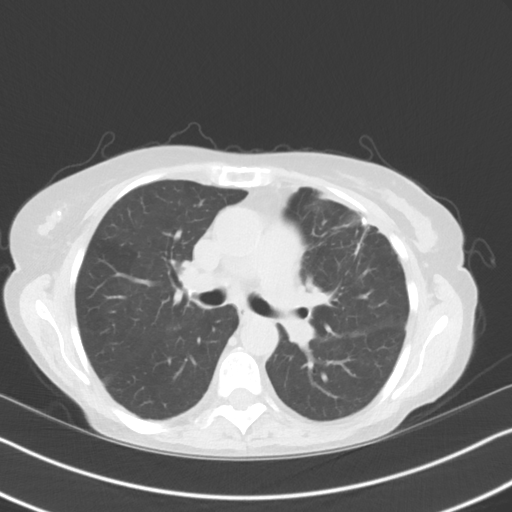
[im 47/63  lung]
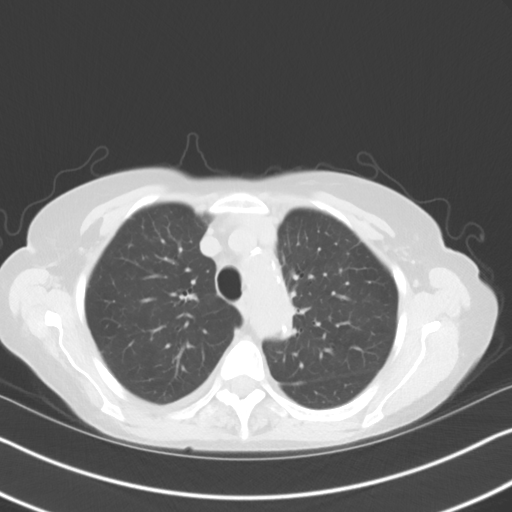
[im 55/63  lung]
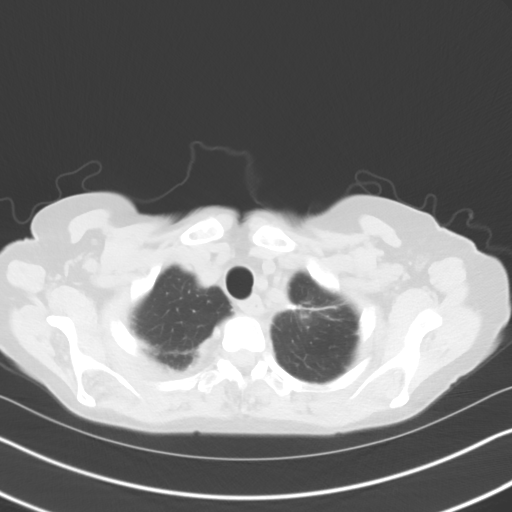

[Series 3: chest w/o 5mm lung · axial · non-contrast · 0.69mm/px · z∈[+1039,+1189]mm · 5 of 61 slices shown]
[im 8/61  lung]
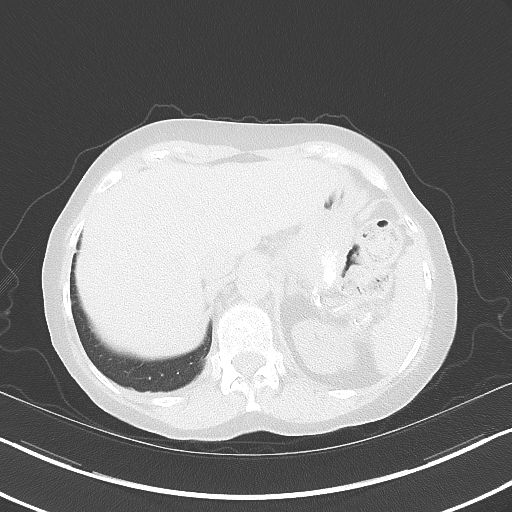
[im 16/61  lung]
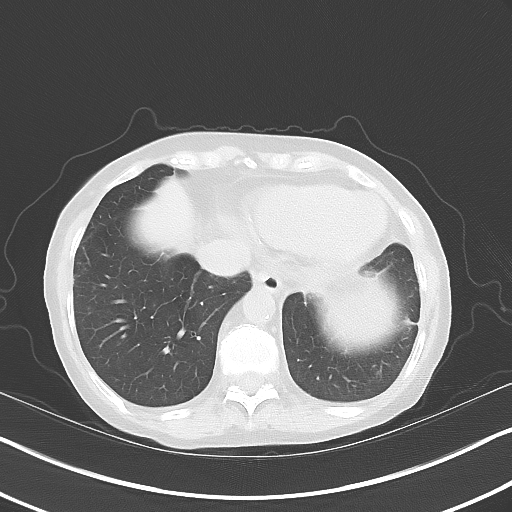
[im 23/61  lung]
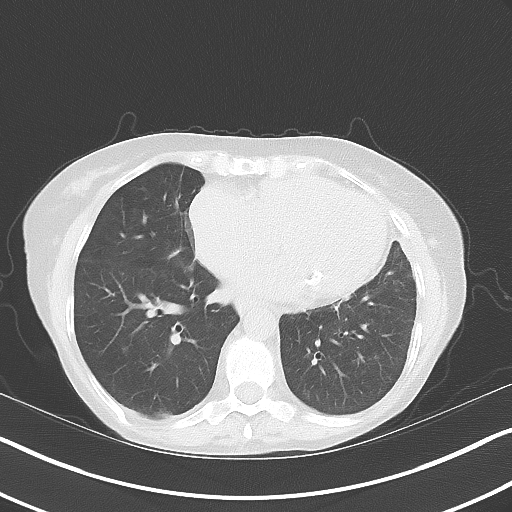
[im 31/61  lung]
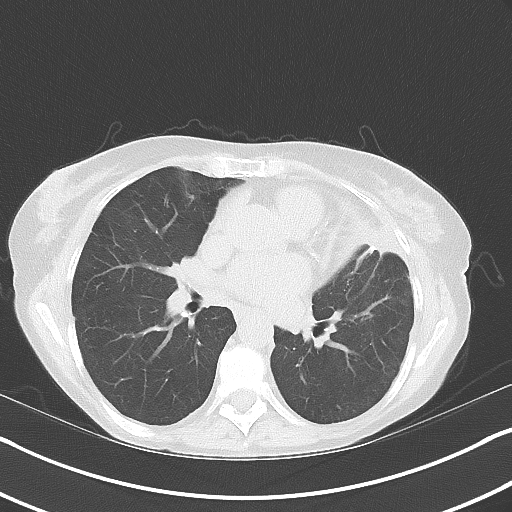
[im 38/61  lung]
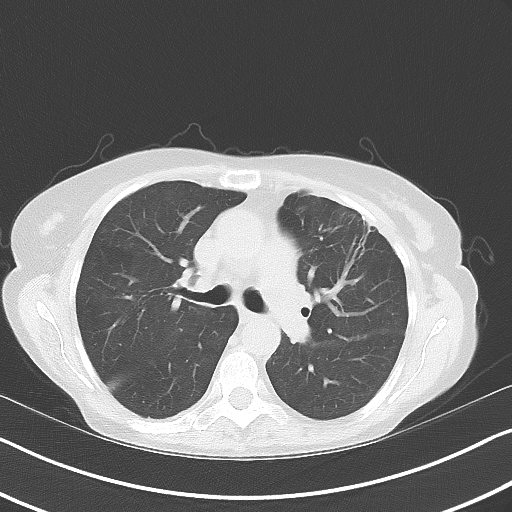

[Series 8: chest w/o 3mm st cor · coronal · non-contrast · 0.61mm/px · 3 of 74 slices shown]
[im 15/74  lung]
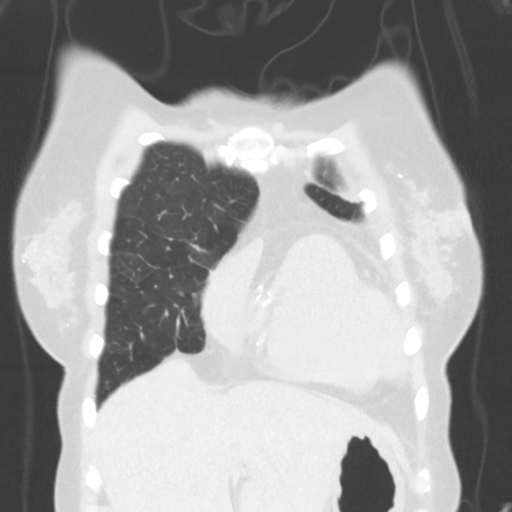
[im 30/74  lung]
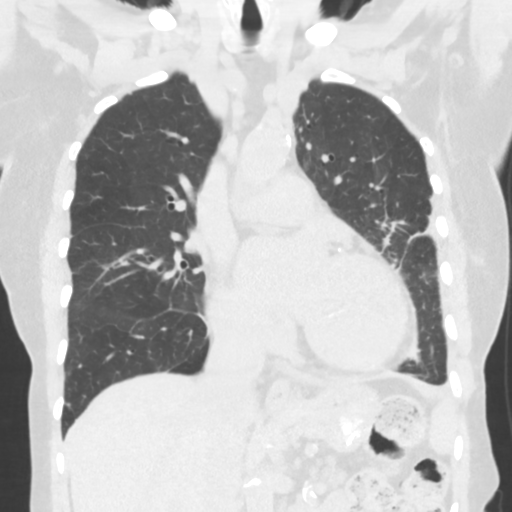
[im 44/74  lung]
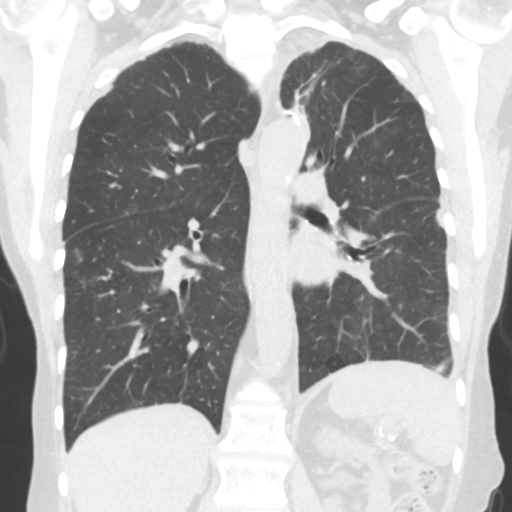

[15 of 36 positions shown; findings below may reference images not displayed]

FINDINGS: Mediastinum/Nodes: Mild cardiomegaly. No pericardial
fluid/thickening. Left anterior descending, left circumflex and
right coronary atherosclerosis. Atherosclerotic nonaneurysmal
thoracic aorta. Main pulmonary artery diameter is 2.7 cm, within
normal limits. There are no calcifications within the pulmonary
arteries on this noncontrast study. There are no obvious dilated
bronchial artery collaterals on this noncontrast study. Normal
visualized thyroid. Normal esophagus. Surgical clips are present in
the right axilla. No axillary adenopathy. There are coarsely
calcified right hilar and right lower peribronchial lymph nodes from
prior granulomatous disease.

Lungs/Pleura: No pneumothorax. No pleural effusion. There are
coarsely calcified granulomas in the posterior right upper lobe and
peripheral right lower lobe. Surgical suture lines are noted in
apical and lingular left upper lobe from prior wedge resection.
There are associated thin subpleural parenchymal bands adjacent to
the suture lines, in keeping with mild postsurgical scarring. There
is a mosaic attenuation throughout the lungs. There is a suggestion
of variability in the sizes of the peripheral pulmonary artery
branches, with the suggestion of smaller pulmonary artery branches
in the areas of low lung attenuation. No acute consolidative
airspace disease, significant pulmonary nodules or lung masses. No
significant regions of subpleural reticulation, traction
bronchiectasis or frank honeycombing. No convincing evidence of air
trapping on the prone limited expiration sequence (the degree of
expiration is limited).

Upper abdomen: Granulomatous calcifications in the normal size
spleen.

Musculoskeletal: No aggressive appearing focal osseous lesions. Mild
degenerative changes in the thoracic spine.
IMPRESSION: 1. Mosaic attenuation throughout the lungs is likely due to
pulmonary vascular disease / plexogenic pulmonary arteriopathy, see
comments. No specific noncontrast CT evidence of chronic pulmonary
thromboembolism (i.e. no pulmonary artery calcifications and no
obvious dilated bronchial artery collaterals), although CTEPH cannot
be excluded on the basis of this noncontrast study. Recommend
correlation with V/Q scan.
2. Mild subpleural scarring associated with the wedge resection
suture lines in the apical and lingular left upper lobe. No
residual/recurrent pleural effusion.
3. Otherwise no evidence of interstitial lung disease. Specifically,
no significant regions of subpleural reticulation, traction
bronchiectasis or frank honeycombing.
4. Mild cardiomegaly.  Three-vessel coronary atherosclerosis.

## 2017-08-03 ENCOUNTER — Ambulatory Visit (HOSPITAL_COMMUNITY)
Admission: RE | Admit: 2017-08-03 | Discharge: 2017-08-03 | Disposition: A | Discharge status: Home / Self Care | Payer: Medicare Other | Source: Ambulatory Visit | Attending: Cardiology | Admitting: Cardiology

## 2017-08-03 VITALS — BP 158/69 | HR 87 | Wt 138.2 lb

## 2017-08-03 DIAGNOSIS — I482 Chronic atrial fibrillation, unspecified: Secondary | ICD-10-CM

## 2017-08-03 DIAGNOSIS — I5032 Chronic diastolic (congestive) heart failure: Secondary | ICD-10-CM | POA: Insufficient documentation

## 2017-08-03 DIAGNOSIS — N183 Chronic kidney disease, stage 3 (moderate): Secondary | ICD-10-CM | POA: Insufficient documentation

## 2017-08-03 DIAGNOSIS — I272 Pulmonary hypertension, unspecified: Secondary | ICD-10-CM | POA: Diagnosis not present

## 2017-08-03 DIAGNOSIS — Z7901 Long term (current) use of anticoagulants: Secondary | ICD-10-CM | POA: Diagnosis not present

## 2017-08-03 DIAGNOSIS — R55 Syncope and collapse: Secondary | ICD-10-CM | POA: Insufficient documentation

## 2017-08-03 DIAGNOSIS — Z79899 Other long term (current) drug therapy: Secondary | ICD-10-CM | POA: Insufficient documentation

## 2017-08-03 LAB — CBC
HCT: 35 % — ABNORMAL LOW (ref 36.0–46.0)
Hemoglobin: 11 g/dL — ABNORMAL LOW (ref 12.0–15.0)
MCH: 28 pg (ref 26.0–34.0)
MCHC: 31.4 g/dL (ref 30.0–36.0)
MCV: 89.1 fL (ref 78.0–100.0)
PLATELETS: 220 10*3/uL (ref 150–400)
RBC: 3.93 MIL/uL (ref 3.87–5.11)
RDW: 16.9 % — AB (ref 11.5–15.5)
WBC: 5.7 10*3/uL (ref 4.0–10.5)

## 2017-08-03 LAB — BASIC METABOLIC PANEL
ANION GAP: 9 (ref 5–15)
BUN: 25 mg/dL — ABNORMAL HIGH (ref 8–23)
CALCIUM: 9.2 mg/dL (ref 8.9–10.3)
CO2: 28 mmol/L (ref 22–32)
Chloride: 101 mmol/L (ref 98–111)
Creatinine, Ser: 1.5 mg/dL — ABNORMAL HIGH (ref 0.44–1.00)
GFR, EST AFRICAN AMERICAN: 35 mL/min — AB (ref >60)
GFR, EST NON AFRICAN AMERICAN: 30 mL/min — AB (ref >60)
Glucose, Bld: 92 mg/dL (ref 70–99)
Potassium: 4 mmol/L (ref 3.5–5.1)
SODIUM: 138 mmol/L (ref 135–145)

## 2017-08-03 LAB — BRAIN NATRIURETIC PEPTIDE: B NATRIURETIC PEPTIDE 5: 381.1 pg/mL — AB (ref 0.0–100.0)

## 2017-08-03 MED ORDER — PYRIDOSTIGMINE BROMIDE 60 MG PO TABS: 30.0000 mg | ORAL_TABLET | Freq: Three times a day (TID) | ORAL | 3 refills | Status: DC

## 2017-08-03 MED ORDER — TORSEMIDE 20 MG PO TABS: 60.0000 mg | ORAL_TABLET | Freq: Every day | ORAL | 3 refills | Status: DC

## 2017-08-03 NOTE — Progress Notes (Signed)
Patient ID: Rachael ParishBetty H Myers, female   DOB: 10-Apr-1929, 84 y.o.   MRN: [id number]    Advanced Heart Failure Clinic Note   PCP: Dr. Royanne Footsichard Orr HF Cardiology: Dr. Shirlee LatchMcLean  84 y.o. with chronic diastolic CHF, chronic atrial fibrillation, and prominent exertional dyspnea presents for followup of pulmonary hypertension and CHF.  She has had atrial fibrillation long-term and has failed Multaq and propafenone use.  She is in atrial fibrillation chronically now.  In 1/16, she developed PNA.  She had a parapneumonic effusion that required 4 thoracenteses.  Eventually, she had VATS with decortication and a wedge resection.  Cytology was negative from the pleural fluid.  [facility] and showed no PE, mild interstitial edema.  Last echo was done in 11/16, showing normal LV EF and apparently normal RV EF.  PA systolic pressure estimation was moderate to severely increased.  I took her for RHC in 12/16, this showed moderate pulmonary arterial hypertension.  PFTs showed a restrictive pattern concerning for interstitial lung disease.  V/Q scan in 12/16 was not suggestive of chronic PE.  Despite abnormal PFTs, high resolution CT did not show interstitial lung disease.    At a prior appointment, started her on Opsumit. She developed a cough and mouth soreness with this medication and had to stop it after about 7 days. Symptoms resolved when she stopped it. Tried to get Adcirca for her. This was denied by her insurance company, so she was started on Revatio 20 mg tid instead. She thinks that this has helped her breathing "a little." She saw a pulmonary specialist who thought dyspnea was likely due to [facility]. Next, tried her on ambrisentan, but she developed facial swelling on ambrisentan (significant around the eyes). She also developed worsening dyspnea. She stopped ambrisentan and the swelling resolved but she was noted to be volume overloaded. Switched her Lasix to torsemide.  She was  started on selexipag which she has tolerated.   Today, weight is up 2 lbs.  She has been getting lightheaded spells again periodically, no syncope or falls.  On questioning, it appears that she has not been taking pyridostigmine, which helped with lightheadedness in the past.  She is not short of breath walking on flat ground generally, but her endurance in the heat is poor.  No dyspnea doing housework. No chest pain.  BP high here today but SBP runs around 130 when she checks at home.  No BRBPR/melena. She does not usually take her afternoon torsemide dose, so generally taking torsemide 40 mg daily.     Labs (9/16): K 4, creatinine 1.31, BNP 466 Labs (11/16): BNP 577 Labs (12/16); K 4, creatinine 1.3, normal rheumatoid factor and scleroderma serologies Labs (1/17): ANA positive but only 1:80 titer, dsDNA negative, SSA/B negative, CCP negative.  Labs (2/17): K 4.1, creatinine 1.48 => 1.42, BNP 487 Labs (3/17): K 4.2, creatinine 1.58, BNP 326 Labs (7/17): K 3.3, creatinine 1.7, hgb 10.7 Labs (8/17): K 3.9, creatinine 1.87, HCT 35.6 Labs (1/18): hgb 9.9, creatinine 1.42 Labs (4/18): TSH normal, hgb 10.5, K 3.8, creatinine 1.34 Labs (6/18): BNP 415, K 4.9, creatinine 1.45 Labs (1/19): K 4.4, creatinine 1.56, hgb 11.3  PMH: 1. Cardiolite (9/16) with EF 54%, no ischemia/infarction. 2. Chronic diastolic CHF: Echo (11/16) with EF 55-60%, normal RV size and systolic function, PA systolic pressure 66 mmHg.  - Echo (7/17) with EF 60-65%, mildly dilated RV with mildly decreased systolic function, PA systolic pressure 36 mmHg.  -  Echo (7/18) with EF 55-60%, mild to moderate AI, normal RV size and systolic function, PASP 43 mmHg.  3. Chronic atrial fibrillation: She has been intolerant of Multaq and propafenone.  Sotalol and flecainide have not been used due to CKD.   4. SBO: Managed conservatively. 5. CKD 6. OA: h/o THR and TKR.  7. H/o hyponatremia 8. Pneumonia with parapneumonic effusion with  thoracenteses and eventual VATS and decortication.  9. Pulmonary hypertension: Suspect Group 1 pulmonary hypertension.  RHC (12/16) with mean RA 5, PA 62/22 mean 40, mean PCWP 19, CI 2.46, PVR 5 WU.  PFTs (12/16) wiith FVC 71%, FEV1 73%, ratio 104%, TLC 71%, DLCO 53% => moderate restriction concerning for interstitial lung disease.  High resolution CT (12/16) did not show evidence for interstitial lung disease.  V/Q scan (12/16) did not show evidence for chronic PE.  Serologies: RF negative, scleroderma antibodies negative, ANA positive but only 1:80 titer, SSA/B negative, dsDNA negative, CCP negative.  She did not tolerate macitentan or ambrisentan.  Echo 7/17 with PA systolic pressure down to 36 mmHg.   - 6 minute walk (12/16) with 244 meters - 6 minute walk (2/17) with 232 meters - 6 minute walk (3/17) with 414 meters - 6 minute walk (4/17) with 267 meters - 6 minute walk (8/17) with 241 meters - 6 minute walk (10/17) with 256 meters - 6 minute walk (6/18) with 305 meters - 6 minute walk (9/18) with 320 meters - 6 minute walk (1/19) with 213 meters - 6 minute walk (7/19) with 335 meters 10. Lightheadedness/syncope: Extensive workup.  Event and holter monitors as well as telemetry monitoring negative.  She has not been orthostatic.  11. Sciatica: Status post back surgery 1/18.   SH: Married, lives in [place], nonsmoker, no ETOH.   FH: Brother with rheumatic fever.   ROS: All systems reviewed and negative except as per HPI.   Current Outpatient Medications  Medication Sig Dispense Refill  . gabapentin (NEURONTIN) 600 MG tablet Take 600 mg by mouth 3 (three) times daily as needed (pain).    . methocarbamol (ROBAXIN) 750 MG tablet Take 1 tablet (750 mg total) by mouth every 6 (six) hours as needed for muscle spasms. 120 tablet 2  . metoprolol succinate (TOPROL-XL) 25 MG 24 hr tablet TAKE 1 TABLET DAILY (CHANGE IN DOSAGE ) 90 tablet 3  . Potassium Chloride ER 20 MEQ TBCR Take 20 mEq by  mouth 2 (two) times daily. 180 tablet 3  . pyridostigmine (MESTINON) 60 MG tablet Take 0.5 tablets (30 mg total) by mouth 3 (three) times daily. 135 tablet 3  . Rivaroxaban (XARELTO) 15 MG TABS tablet Take 15 mg by mouth daily with supper.     . Selexipag (UPTRAVI) 1000 MCG TABS Take 1 tablet by mouth 2 (two) times daily. 60 tablet 11  . sildenafil (REVATIO) 20 MG tablet Take 4 tablets (80 mg total) by mouth 3 (three) times daily. 1140 tablet 6  . torsemide (DEMADEX) 20 MG tablet Take 3 tablets (60 mg total) by mouth daily. 270 tablet 3   No current facility-administered medications for this encounter.    BP (!) 158/69   Pulse 87   Wt 138 lb 3.2 oz (62.7 kg)   SpO2 99%   BMI 21.65 kg/m   Filed Weights   08/03/17 1115  Weight: 138 lb 3.2 oz (62.7 kg)   General: NAD Neck: JVP 8 cm with HJR, no thyromegaly or thyroid nodule.  Lungs: Clear to auscultation bilaterally  with normal respiratory effort. CV: Nondisplaced PMI.  Heart irregular S1/S2, no S3/S4, 2/6 SEM RUSB with clear S2.  Trace ankle edema.  No carotid bruit.  Normal pedal pulses.  Abdomen: Soft, nontender, no hepatosplenomegaly, no distention.  Skin: Intact without lesions or rashes.  Neurologic: Alert and oriented x 3.  Psych: Normal affect. Extremities: No clubbing or cyanosis.  HEENT: Normal.   Assessment/Plan:  1. Chronic diastolic CHF:  NYHA class II symptoms but she looks volume overloaded on exam today.  Weight is up a bit. Not taking pm torsemide.  - Increase torsemide to 60 mg daily with BMET/BNP today and BMET in 10 days.   2. Pulmonary hypertension: Moderate to severe pulmonary hypertension by echo (PASP 66 mmHg). RHC showed moderate pulmonary arterial hypertension with PVR 5 WU.  PFTs with restrictive changes suggestive of interstitial process but high resolution CT did not show interstitial lung disease.  No evidence for chronic PE on V/Q scan.  Rheumatologic serologies all negative.  Possible group 1 pulmonary  hypertension.  She was unable to tolerate macitentan or ambrisentan.  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  She is now on Revatio 60 mg tid and selexipag 1000 mcg bid, unable to increase selexipag further due to symptoms.  PA systolic pressure was down to 43 mmHg on most recent echo in 7/18 from 66 mmHg prior. 6 minute walk today was improved from prior.  - Continue Selexipag at 1000 mcg bid, unable to tolerate uptitration of Selexipag any further.  - Continue sildenafil 80 mg three times a day.  - Repeat echo with followup in 3 months.   3. Pulmonary: s/p PNA with parapneumonic effusion and eventual VATS with decortication.  As above, restrictive PFTs but no ILD on high resolution CT.  4. Atrial fibrillation: Chronic. Following rate control/anticoagulation strategy.   - Continue Toprol XL. - Continue Xarelto 15 mg daily, no bleeding problems. CBC today.  5. Syncope/lightheadedness: Has had syncopal episodes for > 5 years now.  They always occur when standing, but not immediately after standing.  She has not been orthostatic when checked at past appointments.  No arrhythmia has been found, she has worn monitors in the past and was on telemetry in the hospital => had syncope at [facility] in 7/17 but no events on telemetry.  She has been seen by neurology. ?vasovagal.  Symptoms were improved with pyridostigmine but she apparently has not taken this medication recently and lightheaded spells have been worse, no falls or syncope.  - Restart pyridostigmine 30 mg tid. - Continue current dose to Toprol XL at bed time 6. CKD: Stage 3.  BMET today.   Followup in 3 months.   [name] 08/03/2017

## 2017-08-03 NOTE — Progress Notes (Signed)
Patient completed 6 min walk and maintained oxygen sats 91-97% on room air.  Patient ambulated 1100 feet.

## 2017-08-03 NOTE — Patient Instructions (Signed)
Labs today (will call for abnormal results, otherwise no news is good news)  RESTART your pyridostigmine 30 mg Three Times Daily  START taking Torsemide 60 mg Once Daily in the AM.  Echocardiogram and follow up in 3 months.

## 2017-08-09 ENCOUNTER — Other Ambulatory Visit (HOSPITAL_COMMUNITY): Payer: Self-pay

## 2017-08-09 MED ORDER — SELEXIPAG 1000 MCG PO TABS: 1.0000 | ORAL_TABLET | Freq: Two times a day (BID) | ORAL | 11 refills | Status: DC

## 2017-11-04 ENCOUNTER — Ambulatory Visit (HOSPITAL_COMMUNITY): Payer: Medicare Other

## 2017-11-04 ENCOUNTER — Encounter (HOSPITAL_COMMUNITY): Payer: Self-pay | Admitting: Cardiology

## 2018-01-18 ENCOUNTER — Ambulatory Visit (HOSPITAL_BASED_OUTPATIENT_CLINIC_OR_DEPARTMENT_OTHER)
Admission: RE | Admit: 2018-01-18 | Discharge: 2018-01-18 | Disposition: A | Discharge status: Home / Self Care | Payer: Medicare Other | Source: Ambulatory Visit | Attending: Cardiology | Admitting: Cardiology

## 2018-01-18 ENCOUNTER — Ambulatory Visit (HOSPITAL_COMMUNITY)
Admission: RE | Admit: 2018-01-18 | Discharge: 2018-01-18 | Disposition: A | Discharge status: Home / Self Care | Payer: Medicare Other | Source: Ambulatory Visit | Attending: Cardiology | Admitting: Cardiology

## 2018-01-18 VITALS — BP 152/76 | HR 90 | Wt 134.0 lb

## 2018-01-18 DIAGNOSIS — Z7901 Long term (current) use of anticoagulants: Secondary | ICD-10-CM | POA: Insufficient documentation

## 2018-01-18 DIAGNOSIS — I5032 Chronic diastolic (congestive) heart failure: Secondary | ICD-10-CM

## 2018-01-18 DIAGNOSIS — I351 Nonrheumatic aortic (valve) insufficiency: Secondary | ICD-10-CM | POA: Diagnosis not present

## 2018-01-18 DIAGNOSIS — I272 Pulmonary hypertension, unspecified: Secondary | ICD-10-CM | POA: Diagnosis not present

## 2018-01-18 DIAGNOSIS — I482 Chronic atrial fibrillation, unspecified: Secondary | ICD-10-CM | POA: Diagnosis not present

## 2018-01-18 DIAGNOSIS — N183 Chronic kidney disease, stage 3 (moderate): Secondary | ICD-10-CM | POA: Insufficient documentation

## 2018-01-18 DIAGNOSIS — Z79899 Other long term (current) drug therapy: Secondary | ICD-10-CM | POA: Diagnosis not present

## 2018-01-18 DIAGNOSIS — I2721 Secondary pulmonary arterial hypertension: Secondary | ICD-10-CM | POA: Insufficient documentation

## 2018-01-18 LAB — BASIC METABOLIC PANEL
Anion gap: 12 (ref 5–15)
BUN: 25 mg/dL — AB (ref 8–23)
CHLORIDE: 101 mmol/L (ref 98–111)
CO2: 26 mmol/L (ref 22–32)
Calcium: 9.2 mg/dL (ref 8.9–10.3)
Creatinine, Ser: 1.38 mg/dL — ABNORMAL HIGH (ref 0.44–1.00)
GFR calc Af Amer: 40 mL/min — ABNORMAL LOW (ref >60)
GFR calc non Af Amer: 35 mL/min — ABNORMAL LOW (ref >60)
Glucose, Bld: 89 mg/dL (ref 70–99)
POTASSIUM: 3.8 mmol/L (ref 3.5–5.1)
Sodium: 139 mmol/L (ref 135–145)

## 2018-01-18 MED ORDER — SELEXIPAG 1000 MCG PO TABS: 1.0000 | ORAL_TABLET | Freq: Two times a day (BID) | ORAL | 11 refills | Status: DC

## 2018-01-18 MED ORDER — TORSEMIDE 20 MG PO TABS: 60.0000 mg | ORAL_TABLET | Freq: Every day | ORAL | 3 refills | Status: DC

## 2018-01-18 NOTE — Patient Instructions (Signed)
Labs were done, we will call you with any ABNORMAL results. No news is good news!  We completed a 6 minute walk to assess how well you stayed oxygenated during exercise.   We have refilled your requested prescriptions.   Your physician recommends that you schedule a follow-up appointment in 3 months.

## 2018-01-18 NOTE — Progress Notes (Signed)
Patient ID: Rachael ParishBetty H Suman, female   DOB: [birth year], 85 y.o.   MRN: [id number]    Advanced Heart Failure Clinic Note   PCP: Dr. Royanne Footsichard Orr HF Cardiology: Dr. Shirlee LatchMcLean  85 y.o. with chronic diastolic CHF, chronic atrial fibrillation, and prominent exertional dyspnea presents for followup of pulmonary hypertension and CHF.  She has had atrial fibrillation long-term and has failed Multaq and propafenone use.  She is in atrial fibrillation chronically now.  In 1/16, she developed PNA.  She had a parapneumonic effusion that required 4 thoracenteses.  Eventually, she had VATS with decortication and a wedge resection.  Cytology was negative from the pleural fluid.  [facility] and showed no PE, mild interstitial edema.  Last echo was done in 11/16, showing normal LV EF and apparently normal RV EF.  PA systolic pressure estimation was moderate to severely increased.  I took her for RHC in 12/16, this showed moderate pulmonary arterial hypertension.  PFTs showed a restrictive pattern concerning for interstitial lung disease.  V/Q scan in 12/16 was not suggestive of chronic PE.  Despite abnormal PFTs, high resolution CT did not show interstitial lung disease.    At a prior appointment, started her on Opsumit. She developed a cough and mouth soreness with this medication and had to stop it after about 7 days. Symptoms resolved when she stopped it. Tried to get Adcirca for her. This was denied by her insurance company, so she was started on Revatio 20 mg tid instead. She thinks that this has helped her breathing "a little." She saw a pulmonary specialist who thought dyspnea was likely due to [facility]. Next, tried her on ambrisentan, but she developed facial swelling on ambrisentan (significant around the eyes). She also developed worsening dyspnea. She stopped ambrisentan and the swelling resolved but she was noted to be volume overloaded. Switched her Lasix to torsemide.  She was  started on selexipag which she has tolerated.   Today, weight is down 3 lbs.  Lightheaded only if she stands up rapidly, no falls. She is able to do housework like vacuuming without dyspnea.  No dyspnea walking on flat ground.  No chest pain.   Labs (9/16): K 4, creatinine 1.31, BNP 466 Labs (11/16): BNP 577 Labs (12/16); K 4, creatinine 1.3, normal rheumatoid factor and scleroderma serologies Labs (1/17): ANA positive but only 1:80 titer, dsDNA negative, SSA/B negative, CCP negative.  Labs (2/17): K 4.1, creatinine 1.48 => 1.42, BNP 487 Labs (3/17): K 4.2, creatinine 1.58, BNP 326 Labs (7/17): K 3.3, creatinine 1.7, hgb 10.7 Labs (8/17): K 3.9, creatinine 1.87, HCT 35.6 Labs (1/18): hgb 9.9, creatinine 1.42 Labs (4/18): TSH normal, hgb 10.5, K 3.8, creatinine 1.34 Labs (6/18): BNP 415, K 4.9, creatinine 1.45 Labs (1/19): K 4.4, creatinine 1.56, hgb 11.3 Labs (7/19): K 4, creatinine 1.5, BNP 381  PMH: 1. Cardiolite (9/16) with EF 54%, no ischemia/infarction. 2. Chronic diastolic CHF: Echo (11/16) with EF 55-60%, normal RV size and systolic function, PA systolic pressure 66 mmHg.  - Echo (7/17) with EF 60-65%, mildly dilated RV with mildly decreased systolic function, PA systolic pressure 36 mmHg.  - Echo (7/18) with EF 55-60%, mild to moderate AI, normal RV size and systolic function, PASP 43 mmHg.  - Echo (1/20) with EF 55-60%, normal RV size and systolic function, mild AI, mild MR.  3. Chronic atrial fibrillation: She has been intolerant of Multaq and propafenone.  Sotalol and flecainide have not been  used due to CKD.   4. SBO: Managed conservatively. 5. CKD 6. OA: h/o THR and TKR.  7. H/o hyponatremia 8. Pneumonia with parapneumonic effusion with thoracenteses and eventual VATS and decortication.  9. Pulmonary hypertension: Suspect Group 1 pulmonary hypertension.  RHC (12/16) with mean RA 5, PA 62/22 mean 40, mean PCWP 19, CI 2.46, PVR 5 WU.  PFTs (12/16) wiith FVC 71%, FEV1 73%,  ratio 104%, TLC 71%, DLCO 53% => moderate restriction concerning for interstitial lung disease.  High resolution CT (12/16) did not show evidence for interstitial lung disease.  V/Q scan (12/16) did not show evidence for chronic PE.  Serologies: RF negative, scleroderma antibodies negative, ANA positive but only 1:80 titer, SSA/B negative, dsDNA negative, CCP negative.  She did not tolerate macitentan or ambrisentan.  Echo 7/17 with PA systolic pressure down to 36 mmHg.   - 6 minute walk (12/16) with 244 meters - 6 minute walk (2/17) with 232 meters - 6 minute walk (3/17) with 414 meters - 6 minute walk (4/17) with 267 meters - 6 minute walk (8/17) with 241 meters - 6 minute walk (10/17) with 256 meters - 6 minute walk (6/18) with 305 meters - 6 minute walk (9/18) with 320 meters - 6 minute walk (1/19) with 213 meters - 6 minute walk (7/19) with 335 meters - 6 minute walk (1/20) with 213 meters 10. Lightheadedness/syncope: Extensive workup.  Event and holter monitors as well as telemetry monitoring negative.  She has not been orthostatic.  11. Sciatica: Status post back surgery 1/18.   SH: Married, lives in [place], nonsmoker, no ETOH.   FH: Brother with rheumatic fever.   ROS: All systems reviewed and negative except as per HPI.   Current Outpatient Medications  Medication Sig Dispense Refill  . gabapentin (NEURONTIN) 600 MG tablet Take 600 mg by mouth 3 (three) times daily as needed (pain).    . methocarbamol (ROBAXIN) 750 MG tablet Take 1 tablet (750 mg total) by mouth every 6 (six) hours as needed for muscle spasms. 120 tablet 2  . metoprolol succinate (TOPROL-XL) 25 MG 24 hr tablet TAKE 1 TABLET DAILY (CHANGE IN DOSAGE ) 90 tablet 3  . Potassium Chloride ER 20 MEQ TBCR Take 20 mEq by mouth 2 (two) times daily. 180 tablet 3  . pyridostigmine (MESTINON) 60 MG tablet Take 0.5 tablets (30 mg total) by mouth 3 (three) times daily. 135 tablet 3  . Rivaroxaban (XARELTO) 15 MG TABS  tablet Take 15 mg by mouth daily with supper.     . Selexipag (UPTRAVI) 1000 MCG TABS Take 1 tablet by mouth 2 (two) times daily. 60 tablet 11  . sildenafil (REVATIO) 20 MG tablet Take 4 tablets (80 mg total) by mouth 3 (three) times daily. 1140 tablet 6  . torsemide (DEMADEX) 20 MG tablet Take 3 tablets (60 mg total) by mouth daily. 270 tablet 3   No current facility-administered medications for this encounter.    BP (!) 152/76   Pulse 90   Wt 60.8 kg (134 lb)   SpO2 93%   BMI 20.99 kg/m   Filed Weights   01/18/18 1004  Weight: 60.8 kg (134 lb)   General: NAD Neck: No JVD, no thyromegaly or thyroid nodule.  Lungs: Clear to auscultation bilaterally with normal respiratory effort. CV: Nondisplaced PMI.  Heart irregular S1/S2, no S3/S4, 2/6 SEM RUSB.  No peripheral edema.  No carotid bruit.  Normal pedal pulses.  Abdomen: Soft, nontender, no hepatosplenomegaly, no distention.  Skin: Intact without lesions or rashes.  Neurologic: Alert and oriented x 3.  Psych: Normal affect. Extremities: No clubbing or cyanosis.  HEENT: Normal.   Assessment/Plan:  1. Chronic diastolic CHF:  NYHA class II symptoms and weight down.  She does not look volume overloaded on exam.  - Continue torsemide 60 mg daily with BMET today.   2. Pulmonary hypertension: Moderate to severe pulmonary hypertension by echo (PASP 66 mmHg). RHC showed moderate pulmonary arterial hypertension with PVR 5 WU.  PFTs with restrictive changes suggestive of interstitial process but high resolution CT did not show interstitial lung disease.  No evidence for chronic PE on V/Q scan.  Rheumatologic serologies all negative.  Possible group 1 pulmonary hypertension.  She was unable to tolerate macitentan or ambrisentan.  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  She is now on Revatio 60 mg tid and selexipag 1000 mcg bid, unable to increase selexipag further due to symptoms.  Echo today showed preserved RV systolic function and PASP 49 mmHg.   She feels good, but 6 minute walk was less today than in the past.  - Continue Selexipag at 1000 mcg bid, unable to tolerate uptitration of Selexipag any further.  - Continue sildenafil 80 mg three times a day.   3. Pulmonary: s/p PNA with parapneumonic effusion and eventual VATS with decortication.  As above, restrictive PFTs but no ILD on high resolution CT.  4. Atrial fibrillation: Chronic. Following rate control/anticoagulation strategy.   - Continue Toprol XL. - Continue Xarelto 15 mg daily, no bleeding problems.  5. Syncope/lightheadedness: Has had syncopal episodes for > 5 years now.  They always occur when standing, but not immediately after standing.  She has not been orthostatic when checked at past appointments.  No arrhythmia has been found, she has worn monitors in the past and was on telemetry in the hospital => had syncope at [facility] in 7/17 but no events on telemetry.  She has been seen by neurology. ?vasovagal.  Symptoms have been improved on pyridostigmine.  - Continue pyridostigmine 30 mg tid. - Continue current dose to Toprol XL at bed time 6. CKD: Stage 3.  BMET today.   Followup in 3 months.   [name] 01/18/2018

## 2018-01-18 NOTE — Progress Notes (Signed)
Pt performed 6 minute walk, ambulated 213 meters. HR ranged from 78-95. O2 Sat ranged 90-97. Pt tolerated well and denies SOB. No rest breaks were needed.

## 2018-01-27 ENCOUNTER — Other Ambulatory Visit (HOSPITAL_COMMUNITY): Payer: Self-pay | Admitting: Cardiology

## 2018-03-30 ENCOUNTER — Other Ambulatory Visit (HOSPITAL_COMMUNITY): Payer: Self-pay | Admitting: Cardiology

## 2018-04-24 ENCOUNTER — Encounter (HOSPITAL_COMMUNITY): Payer: Self-pay | Admitting: Cardiology

## 2018-05-01 ENCOUNTER — Ambulatory Visit (HOSPITAL_COMMUNITY)
Admission: RE | Admit: 2018-05-01 | Discharge: 2018-05-01 | Disposition: A | Discharge status: Home / Self Care | Payer: Medicare Other | Source: Ambulatory Visit | Attending: Cardiology | Admitting: Cardiology

## 2018-05-01 ENCOUNTER — Other Ambulatory Visit: Payer: Self-pay

## 2018-05-01 DIAGNOSIS — I482 Chronic atrial fibrillation, unspecified: Secondary | ICD-10-CM | POA: Diagnosis not present

## 2018-05-01 DIAGNOSIS — I5032 Chronic diastolic (congestive) heart failure: Secondary | ICD-10-CM | POA: Diagnosis not present

## 2018-05-01 DIAGNOSIS — I272 Pulmonary hypertension, unspecified: Secondary | ICD-10-CM

## 2018-05-01 MED ORDER — METOPROLOL SUCCINATE ER 25 MG PO TB24: 12.5000 mg | ORAL_TABLET | Freq: Every evening | ORAL | 3 refills | Status: DC

## 2018-05-01 NOTE — Progress Notes (Signed)
Heart Failure TeleHealth Note  Due to national recommendations of social distancing due to COVID 19, Audio/video telehealth visit is felt to be most appropriate for this patient at this time.  See MyChart message from today for patient consent regarding telehealth for [facility].  Date:  05/01/2018   ID:  Rachael Myers, DOB 02-22-1929, MRN [id number]  Location: Home  Provider location: [place] Advanced Heart Failure Type of Visit: Established patient  PCP:  Dan Maker., MD  Cardiologist:  Dr. Shirlee Latch  Chief Complaint: Shortness of breath   History of Present Illness: Rachael Myers is a 85 y.o. female who presents via audio/video conferencing for a telehealth visit today.   We attempted video visit but due to poor picture converted to audio only.   she denies symptoms worrisome for COVID 19.   Patient has a history of chronic diastolic CHF, chronic atrial fibrillation, and prominent exertional dyspnea.  She has had atrial fibrillation long-term and has failed Multaq and propafenone use.  She is in atrial fibrillation chronically now.  In 1/16, she developed PNA.  She had a parapneumonic effusion that required 4 thoracenteses.  Eventually, she had VATS with decortication and a wedge resection.  Cytology was negative from the pleural fluid.  Last chest CT was at [facility] in 8/16 and showed no PE, mild interstitial edema.  Last echo was done in 11/16, showing normal LV EF and apparently normal RV EF.  PA systolic pressure estimation was moderate to severely increased.  I took her for RHC in 12/16, this showed moderate pulmonary arterial hypertension.  PFTs showed a restrictive pattern concerning for interstitial lung disease.  V/Q scan in 12/16 was not suggestive of chronic PE.  Despite abnormal PFTs, high resolution CT did not show interstitial lung disease.    At a prior appointment, started her on Opsumit. She developed a cough and mouth soreness with this medication and  had to stop it after about 7 days. Symptoms resolved when she stopped it. Tried to get Adcirca for her. This was denied by her insurance company, so she was started on Revatio 20 mg tid instead. She thinks that this has helped her breathing "a little." She saw a pulmonary specialist who thought dyspnea was likely due to [facility]. Next, tried her on ambrisentan, but she developed facial swelling on ambrisentan (significant around the eyes). She also developed worsening dyspnea. She stopped ambrisentan and the swelling resolved but she was noted to be volume overloaded. Switched her Lasix to torsemide.  She was started on selexipag which she has tolerated.   She had a difficult winter with episode of PNA in 2/20.  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 There was moderate emphysema though she never smoked.   She is fairly stable symptomatically.  No dyspnea walking in the house.  Has not been getting out much due to coronavirus.  Weight is decreased. Mild cough still.  No edema.  She continues to have episodes of orthostatic-type lightheadedness.  She is no longer taking pyridostigmine, says she stopped a while back due to hot flashes.     Labs (9/16): K 4, creatinine 1.31, BNP 466 Labs (11/16): BNP 577 Labs (12/16); K 4, creatinine 1.3, normal rheumatoid factor and scleroderma serologies Labs (1/17): ANA positive but only 1:80 titer, dsDNA negative, SSA/B negative, CCP negative.  Labs (2/17): K 4.1, creatinine 1.48 => 1.42, BNP 487 Labs (3/17):  K 4.2, creatinine 1.58, BNP 326 Labs (7/17): K 3.3, creatinine 1.7, hgb 10.7 Labs (8/17): K 3.9, creatinine 1.87, HCT 35.6 Labs (1/18): hgb 9.9, creatinine 1.42 Labs (4/18): TSH normal, hgb 10.5, K 3.8, creatinine 1.34 Labs (6/18): BNP 415, K 4.9, creatinine 1.45 Labs (1/19): K 4.4, creatinine 1.56, hgb 11.3 Labs (7/19): K 4, creatinine 1.5, BNP  381 Labs (1/20): K 3.8, creatinine 1.38  PMH: 1. Cardiolite (9/16) with EF 54%, no ischemia/infarction. 2. Chronic diastolic CHF: Echo (11/16) with EF 55-60%, normal RV size and systolic function, PA systolic pressure 66 mmHg.  - Echo (7/17) with EF 60-65%, mildly dilated RV with mildly decreased systolic function, PA systolic pressure 36 mmHg.  - Echo (7/18) with EF 55-60%, mild to moderate AI, normal RV size and systolic function, PASP 43 mmHg.  - Echo (1/20) with EF 55-60%, normal RV size and systolic function, mild AI, mild MR.  3. Chronic atrial fibrillation: She has been intolerant of Multaq and propafenone.  Sotalol and flecainide have not been used due to CKD.   4. SBO: Managed conservatively. 5. CKD 6. OA: h/o THR and TKR.  7. H/o hyponatremia 8. Pneumonia with parapneumonic effusion with thoracenteses and eventual VATS and decortication.  9. Pulmonary hypertension: Suspect Group 1 pulmonary hypertension.  RHC (12/16) with mean RA 5, PA 62/22 mean 40, mean PCWP 19, CI 2.46, PVR 5 WU.  PFTs (12/16) wiith FVC 71%, FEV1 73%, ratio 104%, TLC 71%, DLCO 53% => moderate restriction concerning for interstitial lung disease.  High resolution CT (12/16) did not show evidence for interstitial lung disease.  V/Q scan (12/16) did not show evidence for chronic PE.  Serologies: RF negative, scleroderma antibodies negative, ANA positive but only 1:80 titer, SSA/B negative, dsDNA negative, CCP negative.  She did not tolerate macitentan or ambrisentan.  Echo 7/17 with PA systolic pressure down to 36 mmHg.   - 6 minute walk (12/16) with 244 meters - 6 minute walk (2/17) with 232 meters - 6 minute walk (3/17) with 414 meters - 6 minute walk (4/17) with 267 meters - 6 minute walk (8/17) with 241 meters - 6 minute walk (10/17) with 256 meters - 6 minute walk (6/18) with 305 meters - 6 minute walk (9/18) with 320 meters - 6 minute walk (1/19) with 213 meters - 6 minute walk (7/19) with 335 meters - 6  minute walk (1/20) with 213 meters 10. Lightheadedness/syncope: Extensive workup.  Event and holter monitors as well as telemetry monitoring negative.  She has not been orthostatic.  11. Sciatica: Status post back surgery 1/18.    Current Outpatient Medications  Medication Sig Dispense Refill   gabapentin (NEURONTIN) 600 MG tablet Take 600 mg by mouth 3 (three) times daily as needed (pain).     methocarbamol (ROBAXIN) 750 MG tablet Take 1 tablet (750 mg total) by mouth every 6 (six) hours as needed for muscle spasms. 120 tablet 2   metoprolol succinate (TOPROL XL) 25 MG 24 hr tablet Take 0.5 tablets (12.5 mg total) by mouth every evening. 45 tablet 3   Potassium Chloride ER 20 MEQ TBCR Take 20 mEq by mouth 2 (two) times daily. 180 tablet 3   pyridostigmine (MESTINON) 60 MG tablet Take 0.5 tablets (30 mg total) by mouth 3 (three) times daily. 135 tablet 3   Rivaroxaban (XARELTO) 15 MG TABS tablet Take 15 mg by mouth daily with supper.      Selexipag (UPTRAVI) 1000 MCG TABS Take 1 tablet by mouth 2 (two)  times daily. 60 tablet 11   sildenafil (REVATIO) 20 MG tablet TAKE 4 TABLETS THREE TIMES A DAY (CHANGE IN DOSAGE) 1140 tablet 4   torsemide (DEMADEX) 20 MG tablet Take 3 tablets (60 mg total) by mouth daily. 270 tablet 3   No current facility-administered medications for this encounter.     Allergies:   Acetaminophen; Hydrocodone-acetaminophen; Letairis [ambrisentan]; Opsumit [macitentan]; Ace inhibitors; Atenolol; Hctz [hydrochlorothiazide]; Oxycodone; and Diltiazem hcl   Social History:  The patient  reports that she has never smoked. She has never used smokeless tobacco. She reports that she does not drink alcohol or use drugs.   Family History:  The patient's family history includes Cancer in her mother.   ROS:  Please see the history of present illness.   All other systems are personally reviewed and negative.   Exam:  (Video/Tele Health Call; Exam is subjective and  or/visual.) Weight 131 lbs, BP 108/60, HR 90 (obtained by patient) General:  Speaks in full sentences. No resp difficulty. Lungs: Normal respiratory effort with conversation.  Abdomen: Non-distended per patient report Extremities: Pt denies edema. Neuro: Alert & oriented x 3.   Recent Labs: 08/03/2017: B Natriuretic Peptide 381.1; Hemoglobin 11.0; Platelets 220 01/18/2018: BUN 25; Creatinine, Ser 1.38; Potassium 3.8; Sodium 139  Personally reviewed   Wt Readings from Last 3 Encounters:  01/18/18 60.8 kg (134 lb)  08/03/17 62.7 kg (138 lb 3.2 oz)  05/03/17 62 kg (136 lb 12 oz)      ASSESSMENT AND PLAN:  1. Chronic diastolic CHF:  NYHA class II-III symptoms (stable) and weight down.    - Continue torsemide 60 mg daily, will arrange BMET.    2. Pulmonary hypertension: Moderate to severe pulmonary hypertension by echo (PASP 66 mmHg). RHC showed moderate pulmonary arterial hypertension with PVR 5 WU.  PFTs with restrictive changes suggestive of interstitial process but high resolution CT did not show interstitial lung disease.  No evidence for chronic PE on V/Q scan.  Rheumatologic serologies all negative.  Possible group 1 pulmonary hypertension.  She was unable to tolerate macitentan or ambrisentan.  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  She is now on Revatio 60 mg tid and selexipag 1000 mcg bid, unable to increase selexipag further due to symptoms.  Echo in 1/20 showed preserved RV systolic function and PASP 49 mmHg.   - Continue Selexipag at 1000 mcg bid, unable to tolerate uptitration of Selexipag any further.  - Continue sildenafil 80 mg three times a day.   3. Pulmonary: s/p PNA with parapneumonic effusion and eventual VATS with decortication.  As above, restrictive PFTs but no ILD on high resolution CT.  Most recent CT in 4/20 showed a concerning RLL nodule and emphysema, though she never smoked.  - She has pulmonary followup for nodule.  4. Atrial fibrillation: Chronic. Following rate  control/anticoagulation strategy.   - Continue Toprol XL. - Continue Xarelto 15 mg daily, no bleeding problems.  Arrange CBC.  5. Syncope/lightheadedness: Has had syncopal episodes for > 5 years now.  They always occur when standing, but not immediately after standing.  She has not been orthostatic when checked at past appointments.  No arrhythmia has been found, she has worn monitors in the past and was on telemetry in the hospital => had syncope at [facility] in 7/17 but no events on telemetry.  She has been seen by neurology. ?vasovagal.  She was on pyridostigmine but says it caused hot flashes so she stopped it.    -  She will stay off pyridostigmine, thinks that stopping it did not worsen her dizziness.  - Keep Toprol XL dose minimal, 12.5 mg daily.  6. CKD: Stage 3.  BMET.    COVID screen The patient does not have any symptoms that suggest any further testing/ screening at this time.  Social distancing reinforced today.  Patient Risk: After full review of this patients clinical status, I feel that they are at moderate risk for cardiac decompensation at this time.  Relevant cardiac medications were reviewed at length with the patient today. The patient does not have concerns regarding their medications at this time.   Recommended follow-up:  3 months  Today, I have spent 21 minutes with the patient with telehealth technology discussing the above issues .    Signed, [name], MD  05/01/2018 11:20 PM  [facility] [address] [facility] [place] [address] [phone] (office) [phone] (fax)

## 2018-05-01 NOTE — Patient Instructions (Signed)
Lab work will need to be done in 1 weeks time. Your prescription was sent to Dr. Wende Neighbors office. Please follow up with them in order to schedule an appointment.  START Toprol XL 12.5mg  every night  Please follow up with Dr. Shirlee Latch in 3 months. This is scheduled for July 28th at 11:20am. The parking code for July is 9009.

## 2018-05-01 NOTE — Progress Notes (Addendum)
Spoke to pt to review after visit summary. Pt verbalized understanding and is agreeable. No further questions at this time. Avs to be sent via mail.

## 2018-07-10 ENCOUNTER — Other Ambulatory Visit (HOSPITAL_COMMUNITY): Payer: Self-pay | Admitting: Cardiology

## 2018-08-01 ENCOUNTER — Encounter (HOSPITAL_COMMUNITY): Payer: Self-pay | Admitting: Cardiology

## 2018-11-03 ENCOUNTER — Ambulatory Visit (HOSPITAL_COMMUNITY)
Admission: RE | Admit: 2018-11-03 | Discharge: 2018-11-03 | Disposition: A | Discharge status: Home / Self Care | Payer: Medicare Other | Source: Ambulatory Visit | Attending: Cardiology | Admitting: Cardiology

## 2018-11-03 ENCOUNTER — Encounter (HOSPITAL_COMMUNITY): Payer: Self-pay | Admitting: Cardiology

## 2018-11-03 ENCOUNTER — Other Ambulatory Visit: Payer: Self-pay

## 2018-11-03 VITALS — BP 128/65 | HR 78 | Wt 131.8 lb

## 2018-11-03 DIAGNOSIS — Z7901 Long term (current) use of anticoagulants: Secondary | ICD-10-CM | POA: Diagnosis not present

## 2018-11-03 DIAGNOSIS — J439 Emphysema, unspecified: Secondary | ICD-10-CM | POA: Diagnosis not present

## 2018-11-03 DIAGNOSIS — M1710 Unilateral primary osteoarthritis, unspecified knee: Secondary | ICD-10-CM | POA: Insufficient documentation

## 2018-11-03 DIAGNOSIS — R911 Solitary pulmonary nodule: Secondary | ICD-10-CM | POA: Insufficient documentation

## 2018-11-03 DIAGNOSIS — Z809 Family history of malignant neoplasm, unspecified: Secondary | ICD-10-CM | POA: Diagnosis not present

## 2018-11-03 DIAGNOSIS — N183 Chronic kidney disease, stage 3 unspecified: Secondary | ICD-10-CM | POA: Insufficient documentation

## 2018-11-03 DIAGNOSIS — I482 Chronic atrial fibrillation, unspecified: Secondary | ICD-10-CM | POA: Diagnosis not present

## 2018-11-03 DIAGNOSIS — Z96659 Presence of unspecified artificial knee joint: Secondary | ICD-10-CM | POA: Diagnosis not present

## 2018-11-03 DIAGNOSIS — Z888 Allergy status to other drugs, medicaments and biological substances status: Secondary | ICD-10-CM | POA: Diagnosis not present

## 2018-11-03 DIAGNOSIS — I2721 Secondary pulmonary arterial hypertension: Secondary | ICD-10-CM | POA: Insufficient documentation

## 2018-11-03 DIAGNOSIS — I5032 Chronic diastolic (congestive) heart failure: Secondary | ICD-10-CM | POA: Diagnosis present

## 2018-11-03 DIAGNOSIS — R42 Dizziness and giddiness: Secondary | ICD-10-CM | POA: Insufficient documentation

## 2018-11-03 DIAGNOSIS — R55 Syncope and collapse: Secondary | ICD-10-CM | POA: Diagnosis not present

## 2018-11-03 DIAGNOSIS — Z885 Allergy status to narcotic agent status: Secondary | ICD-10-CM | POA: Insufficient documentation

## 2018-11-03 DIAGNOSIS — I272 Pulmonary hypertension, unspecified: Secondary | ICD-10-CM | POA: Diagnosis not present

## 2018-11-03 DIAGNOSIS — Z79899 Other long term (current) drug therapy: Secondary | ICD-10-CM | POA: Insufficient documentation

## 2018-11-03 LAB — BASIC METABOLIC PANEL
Anion gap: 10 (ref 5–15)
BUN: 32 mg/dL — ABNORMAL HIGH (ref 8–23)
CO2: 27 mmol/L (ref 22–32)
Calcium: 9.2 mg/dL (ref 8.9–10.3)
Chloride: 101 mmol/L (ref 98–111)
Creatinine, Ser: 1.73 mg/dL — ABNORMAL HIGH (ref 0.44–1.00)
GFR calc Af Amer: 30 mL/min — ABNORMAL LOW (ref >60)
GFR calc non Af Amer: 26 mL/min — ABNORMAL LOW (ref >60)
Glucose, Bld: 95 mg/dL (ref 70–99)
Potassium: 4.2 mmol/L (ref 3.5–5.1)
Sodium: 138 mmol/L (ref 135–145)

## 2018-11-03 LAB — CBC
HCT: 35.7 % — ABNORMAL LOW (ref 36.0–46.0)
Hemoglobin: 11.1 g/dL — ABNORMAL LOW (ref 12.0–15.0)
MCH: 28.2 pg (ref 26.0–34.0)
MCHC: 31.1 g/dL (ref 30.0–36.0)
MCV: 90.6 fL (ref 80.0–100.0)
Platelets: 208 10*3/uL (ref 150–400)
RBC: 3.94 MIL/uL (ref 3.87–5.11)
RDW: 14.4 % (ref 11.5–15.5)
WBC: 5.7 10*3/uL (ref 4.0–10.5)
nRBC: 0 % (ref 0.0–0.2)

## 2018-11-03 MED ORDER — MIDODRINE HCL 5 MG PO TABS: 5.0000 mg | ORAL_TABLET | Freq: Three times a day (TID) | ORAL | 5 refills | Status: DC

## 2018-11-03 NOTE — Patient Instructions (Addendum)
Labs done today. We will contact you only if your labs are abnormal.  STOP Pyridostigmine  START Midodrine 5mg  tab(1 tab) by mouth three times daily.  Please continue all other medication as prescribed.   Your physician recommends that you schedule a follow-up appointment in: 6 weeks  At the [facility], you and your health needs are our priority. As part of our continuing mission to provide you with exceptional heart care, we have created designated Provider Care Teams. These Care Teams include your primary Cardiologist (physician) and Advanced Practice Providers (APPs- Physician Assistants and Nurse Practitioners) who all work together to provide you with the care you need, when you need it.   You may see any of the following providers on your designated Care Team at your next follow up: Marland Kitchen Dr Glori Bickers . Dr Loralie Champagne . Darrick Grinder, NP . Lyda Jester, PA   Please be sure to bring in all your medications bottles to every appointment.

## 2018-11-05 NOTE — Progress Notes (Signed)
Date:  11/05/2018   ID:  Rachael Myers, DOB [birth year], MRN [id number]  Provider location: [facility] Type of Visit: Established patient  PCP:  Myrtis Hopping., MD  Cardiologist:  Dr. Aundra Dubin  Chief Complaint: Shortness of breath   History of Present Illness: Rachael Myers is a 85 y.o. female who has a history of chronic diastolic CHF, chronic atrial fibrillation, and prominent exertional dyspnea.  She has had atrial fibrillation long-term and has failed Multaq and propafenone use.  She is in atrial fibrillation chronically now.  In 1/16, she developed PNA.  She had a parapneumonic effusion that required 4 thoracenteses.  Eventually, she had VATS with decortication and a wedge resection.  Cytology was negative from the pleural fluid.  Last chest CT was at [facility] in 8/16 and showed no PE, mild interstitial edema.  Last echo was done in 11/16, showing normal LV EF and apparently normal RV EF.  PA systolic pressure estimation was moderate to severely increased.  I took her for Basye in 12/16, this showed moderate pulmonary arterial hypertension.  PFTs showed a restrictive pattern concerning for interstitial lung disease.  V/Q scan in 12/16 was not suggestive of chronic PE.  Despite abnormal PFTs, high resolution CT did not show interstitial lung disease.    At a prior appointment, started her on Opsumit. She developed a cough and mouth soreness with this medication and had to stop it after about 7 days. Symptoms resolved when she stopped it. Tried to get Adcirca for her. This was denied by her insurance company, so she was started on Revatio 20 mg tid instead. She thinks that this has helped her breathing "a little." She saw a pulmonary specialist who thought dyspnea was likely due to [facility]. Next, tried her on ambrisentan, but she developed facial swelling on ambrisentan (significant around the eyes). She also developed worsening dyspnea. She stopped ambrisentan  and the swelling resolved but she was noted to be volume overloaded. Switched her Lasix to torsemide.  She was started on selexipag which she has tolerated.   She had a difficult winter with episode of PNA in 2/20.  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 There was moderate emphysema though she never smoked.   She returns today followup of pulmonary hypertension.  Weight is down 3 lbs.  She is no longer taking pyridostigmine due to side effects.  She has been having more orthostatic-type lightheadedness over the last few weeks.  No falls but she has been close.  SBP around 90 at times when she checks while dizzy.  No chest pain.  No significant exertional dyspnea though not very active with COVID pandemic.  No orthopnea/PND.   Labs (9/16): K 4, creatinine 1.31, BNP 466 Labs (11/16): BNP 577 Labs (12/16); K 4, creatinine 1.3, normal rheumatoid factor and scleroderma serologies Labs (1/17): ANA positive but only 1:80 titer, dsDNA negative, SSA/B negative, CCP negative.  Labs (2/17): K 4.1, creatinine 1.48 => 1.42, BNP 487 Labs (3/17): K 4.2, creatinine 1.58, BNP 326 Labs (7/17): K 3.3, creatinine 1.7, hgb 10.7 Labs (8/17): K 3.9, creatinine 1.87, HCT 35.6 Labs (1/18): hgb 9.9, creatinine 1.42 Labs (4/18): TSH normal, hgb 10.5, K 3.8, creatinine 1.34 Labs (6/18): BNP 415, K 4.9, creatinine 1.45 Labs (1/19): K 4.4, creatinine 1.56, hgb 11.3 Labs (7/19): K 4, creatinine 1.5, BNP 381 Labs (1/20): K 3.8, creatinine 1.38  PMH: 1. Cardiolite (9/16) with EF 54%, no ischemia/infarction. 2. Chronic diastolic CHF: Echo (11/16) with EF 55-60%, normal RV size and systolic function, PA systolic pressure 66 mmHg.  - Echo (7/17) with EF 60-65%, mildly dilated RV with mildly decreased systolic function, PA systolic pressure 36 mmHg.  - Echo (7/18) with EF 55-60%, mild to moderate AI, normal RV size and  systolic function, PASP 43 mmHg.  - Echo (1/20) with EF 55-60%, normal RV size and systolic function, mild AI, mild MR.  3. Chronic atrial fibrillation: She has been intolerant of Multaq and propafenone.  Sotalol and flecainide have not been used due to CKD.   4. SBO: Managed conservatively. 5. CKD 6. OA: h/o THR and TKR.  7. H/o hyponatremia 8. Pneumonia with parapneumonic effusion with thoracenteses and eventual VATS and decortication.  9. Pulmonary hypertension: Suspect Group 1 pulmonary hypertension.  RHC (12/16) with mean RA 5, PA 62/22 mean 40, mean PCWP 19, CI 2.46, PVR 5 WU.  PFTs (12/16) wiith FVC 71%, FEV1 73%, ratio 104%, TLC 71%, DLCO 53% => moderate restriction concerning for interstitial lung disease.  High resolution CT (12/16) did not show evidence for interstitial lung disease.  V/Q scan (12/16) did not show evidence for chronic PE.  Serologies: RF negative, scleroderma antibodies negative, ANA positive but only 1:80 titer, SSA/B negative, dsDNA negative, CCP negative.  She did not tolerate macitentan or ambrisentan.  Echo 7/17 with PA systolic pressure down to 36 mmHg.   - 6 minute walk (12/16) with 244 meters - 6 minute walk (2/17) with 232 meters - 6 minute walk (3/17) with 414 meters - 6 minute walk (4/17) with 267 meters - 6 minute walk (8/17) with 241 meters - 6 minute walk (10/17) with 256 meters - 6 minute walk (6/18) with 305 meters - 6 minute walk (9/18) with 320 meters - 6 minute walk (1/19) with 213 meters - 6 minute walk (7/19) with 335 meters - 6 minute walk (1/20) with 213 meters - 6 minute walk (10/20) with 243 meters 10. Lightheadedness/syncope: Extensive workup.  Event and holter monitors as well as telemetry monitoring negative.  She has not been orthostatic.  - Did not tolerate pyridostigmine due to hot flashes.  11. Sciatica: Status post back surgery 1/18.    Current Outpatient Medications  Medication Sig Dispense Refill   gabapentin (NEURONTIN) 600  MG tablet Take 600 mg by mouth 3 (three) times daily as needed (pain).     metoprolol succinate (TOPROL XL) 25 MG 24 hr tablet Take 0.5 tablets (12.5 mg total) by mouth every evening. 45 tablet 3   Potassium Chloride ER 20 MEQ TBCR Take 20 mEq by mouth 2 (two) times daily. 180 tablet 3   Rivaroxaban (XARELTO) 15 MG TABS tablet Take 15 mg by mouth daily with supper.      sildenafil (REVATIO) 20 MG tablet TAKE 4 TABLETS THREE TIMES A DAY (CHANGE IN DOSAGE) 1140 tablet 4   torsemide (DEMADEX) 20 MG tablet Take 3 tablets (60 mg total) by mouth daily. 270 tablet 3   UPTRAVI 1000 MCG TABS Take 1 tablet by mouth twice daily. 60 tablet 11   midodrine (PROAMATINE) 5 MG tablet Take 1 tablet (5 mg total) by mouth 3 (three) times daily with meals. 90 tablet 5   No current facility-administered medications for this encounter.     Allergies:   Acetaminophen, Hydrocodone-acetaminophen, Letairis [ambrisentan], Opsumit [macitentan], Ace inhibitors, Atenolol, Hctz [hydrochlorothiazide], Oxycodone, and Diltiazem hcl   Social History:  The patient  reports that she has never smoked. She has never used smokeless tobacco. She reports that she does not drink alcohol or use drugs.   Family History:  The patient's family history includes Cancer in her mother.   ROS:  Please see the history of present illness.   All other systems are personally reviewed and negative.   Exam:   BP 128/65    Pulse 78    Wt 59.8 kg (131 lb 12.8 oz)    SpO2 97%    BMI 20.64 kg/m  General: NAD Neck: No JVD, no thyromegaly or thyroid nodule.  Lungs: Clear to auscultation bilaterally with normal respiratory effort. CV: Nondisplaced PMI.  Heart irregular S1/S2, no S3/S4, 2/6 HSM LLSB.  No peripheral edema.  No carotid bruit.  2+ PT pulses bilaterally.  Abdomen: Soft, nontender, no hepatosplenomegaly, no distention.  Skin: Intact without lesions or rashes.  Neurologic: Alert and oriented x 3.  Psych: Normal affect. Extremities:  No clubbing or cyanosis.  HEENT: Normal.   Recent Labs: 11/03/2018: BUN 32; Creatinine, Ser 1.73; Hemoglobin 11.1; Platelets 208; Potassium 4.2; Sodium 138  Personally reviewed   Wt Readings from Last 3 Encounters:  11/03/18 59.8 kg (131 lb 12.8 oz)  01/18/18 60.8 kg (134 lb)  08/03/17 62.7 kg (138 lb 3.2 oz)      ASSESSMENT AND PLAN: 1. Chronic diastolic CHF: NYHA class II symptoms (stable) and weight down.   -Continue torsemide60 mg daily, BMET today.    2. Pulmonary hypertension: Moderate to severe pulmonary hypertension by echo (PASP 66 mmHg). RHC showed moderate pulmonary arterial hypertension with PVR 5 WU. PFTs with restrictive changes suggestive of interstitial process but high resolution CT did not show interstitial lung disease. No evidence for chronic PE on V/Q scan. Rheumatologic serologies all negative. Possible group 1 pulmonary hypertension. She was unable to tolerate macitentan or ambrisentan. 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 She is now on sildenafil 80 mg tid and selexipag 1000 mcg bid, unable to increase selexipag further due to symptoms. Echo in 1/20 showed preserved RV systolic function and PASP 49 mmHg. Stable 6 minute walk today.  - Continue Selexipag at 1000 mcg bid, unable to tolerate uptitration of Selexipag any further.  - Continue sildenafil 80 mg three times a day.  3. Pulmonary: s/p PNA with parapneumonic effusion and eventual VATS with decortication. As above, restrictive PFTs but no ILD on high resolution CT.  Most recent CT in 4/20 showed a concerning RLL nodule and emphysema, though she never smoked.  - She has pulmonary followup for nodule.  4. Atrial fibrillation: Chronic. Following rate control/anticoagulation strategy.  - Continue Toprol XL. - Continue Xarelto 15 mg daily, no bleeding problems.  CBC today.  5. Syncope/lightheadedness: Has had syncopal episodes for >5 years now. They always occur when standing, but not immediately  after standing. She has not been orthostatic when checked at past appointments. No arrhythmia has been found, she has worn monitors in the past and was on telemetry in the hospital =>had syncope at [facility] in 7/17 but no events on telemetry. She has been seen by neurology. ?vasovagal. She was on pyridostigmine but says it caused hot flashes so she stopped it.  Symptoms worse recently and BP low at times when symptomatic at home.  - I will start her on midodrine 5 mg tid.    6. CKD: Stage 3. BMET.    Followup in 6 wks.   Signed, Marca Anconaalton Sherisa Gilvin, MD  11/05/2018  [facility] [address] [facility] [place] [address] [phone] (office) [phone] (fax)

## 2018-11-06 ENCOUNTER — Telehealth (HOSPITAL_COMMUNITY): Payer: Self-pay

## 2018-11-06 DIAGNOSIS — I5032 Chronic diastolic (congestive) heart failure: Secondary | ICD-10-CM

## 2018-11-06 MED ORDER — TORSEMIDE 20 MG PO TABS: 40.0000 mg | ORAL_TABLET | Freq: Every day | ORAL | 3 refills | Status: DC

## 2018-11-06 NOTE — Telephone Encounter (Signed)
-----   Message from Larey Dresser, MD sent at 11/06/2018 12:18 AM EST ----- Creatinine higher, decrease torsemide to 40 mg daily.  BMET 10 days.

## 2018-11-06 NOTE — Telephone Encounter (Signed)
Pt aware of results and med changes. Will rtc next week for repeat lab work

## 2018-11-16 ENCOUNTER — Other Ambulatory Visit (HOSPITAL_COMMUNITY): Payer: Medicare Other

## 2018-11-17 ENCOUNTER — Ambulatory Visit (HOSPITAL_COMMUNITY)
Admission: RE | Admit: 2018-11-17 | Discharge: 2018-11-17 | Disposition: A | Discharge status: Home / Self Care | Payer: Medicare Other | Source: Ambulatory Visit | Attending: Cardiology | Admitting: Cardiology

## 2018-11-17 ENCOUNTER — Other Ambulatory Visit: Payer: Self-pay

## 2018-11-17 DIAGNOSIS — I5032 Chronic diastolic (congestive) heart failure: Secondary | ICD-10-CM | POA: Insufficient documentation

## 2018-11-17 LAB — BASIC METABOLIC PANEL
Anion gap: 13 (ref 5–15)
BUN: 29 mg/dL — ABNORMAL HIGH (ref 8–23)
CO2: 26 mmol/L (ref 22–32)
Calcium: 9.4 mg/dL (ref 8.9–10.3)
Chloride: 100 mmol/L (ref 98–111)
Creatinine, Ser: 1.59 mg/dL — ABNORMAL HIGH (ref 0.44–1.00)
GFR calc Af Amer: 33 mL/min — ABNORMAL LOW (ref >60)
GFR calc non Af Amer: 29 mL/min — ABNORMAL LOW (ref >60)
Glucose, Bld: 89 mg/dL (ref 70–99)
Potassium: 3.8 mmol/L (ref 3.5–5.1)
Sodium: 139 mmol/L (ref 135–145)

## 2018-12-19 ENCOUNTER — Encounter (HOSPITAL_COMMUNITY): Payer: Medicare Other | Admitting: Cardiology

## 2019-02-05 ENCOUNTER — Other Ambulatory Visit: Payer: Self-pay

## 2019-02-05 ENCOUNTER — Encounter (HOSPITAL_COMMUNITY): Payer: Self-pay | Admitting: Cardiology

## 2019-02-05 ENCOUNTER — Ambulatory Visit (HOSPITAL_COMMUNITY)
Admission: RE | Admit: 2019-02-05 | Discharge: 2019-02-05 | Disposition: A | Discharge status: Home / Self Care | Payer: Medicare Other | Source: Ambulatory Visit | Attending: Cardiology | Admitting: Cardiology

## 2019-02-05 VITALS — BP 163/86 | HR 80 | Wt 130.6 lb

## 2019-02-05 DIAGNOSIS — N183 Chronic kidney disease, stage 3 unspecified: Secondary | ICD-10-CM | POA: Insufficient documentation

## 2019-02-05 DIAGNOSIS — Z885 Allergy status to narcotic agent status: Secondary | ICD-10-CM | POA: Insufficient documentation

## 2019-02-05 DIAGNOSIS — Z8616 Personal history of COVID-19: Secondary | ICD-10-CM | POA: Diagnosis not present

## 2019-02-05 DIAGNOSIS — I482 Chronic atrial fibrillation, unspecified: Secondary | ICD-10-CM | POA: Insufficient documentation

## 2019-02-05 DIAGNOSIS — J439 Emphysema, unspecified: Secondary | ICD-10-CM | POA: Diagnosis not present

## 2019-02-05 DIAGNOSIS — I272 Pulmonary hypertension, unspecified: Secondary | ICD-10-CM | POA: Diagnosis not present

## 2019-02-05 DIAGNOSIS — I5032 Chronic diastolic (congestive) heart failure: Secondary | ICD-10-CM | POA: Diagnosis present

## 2019-02-05 DIAGNOSIS — Z79899 Other long term (current) drug therapy: Secondary | ICD-10-CM | POA: Insufficient documentation

## 2019-02-05 DIAGNOSIS — Z888 Allergy status to other drugs, medicaments and biological substances status: Secondary | ICD-10-CM | POA: Insufficient documentation

## 2019-02-05 DIAGNOSIS — R52 Pain, unspecified: Secondary | ICD-10-CM | POA: Diagnosis not present

## 2019-02-05 DIAGNOSIS — Z7901 Long term (current) use of anticoagulants: Secondary | ICD-10-CM | POA: Insufficient documentation

## 2019-02-05 DIAGNOSIS — R55 Syncope and collapse: Secondary | ICD-10-CM | POA: Insufficient documentation

## 2019-02-05 NOTE — Progress Notes (Signed)
Date:  02/05/2019   ID:  Rachael Myers, DOB Mar 04, 1929, MRN [id number]  Provider location: [facility] Type of Visit: Established patient  PCP:  Dan Maker., MD  Cardiologist:  Dr. Shirlee Latch  Chief Complaint: Shortness of breath   History of Present Illness: Rachael Myers is a 86 y.o. female who has a history of chronic diastolic CHF, chronic atrial fibrillation, and prominent exertional dyspnea.  She has had atrial fibrillation long-term and has failed Multaq and propafenone use.  She is in atrial fibrillation chronically now.  In 1/16, she developed PNA.  She had a parapneumonic effusion that required 4 thoracenteses.  Eventually, she had VATS with decortication and a wedge resection.  Cytology was negative from the pleural fluid.  Last chest CT was at [facility] in 8/16 and showed no PE, mild interstitial edema.  Last echo was done in 11/16, showing normal LV EF and apparently normal RV EF.  PA systolic pressure estimation was moderate to severely increased.  I took her for RHC in 12/16, this showed moderate pulmonary arterial hypertension.  PFTs showed a restrictive pattern concerning for interstitial lung disease.  V/Q scan in 12/16 was not suggestive of chronic PE.  Despite abnormal PFTs, high resolution CT did not show interstitial lung disease.    At a prior appointment, started her on Opsumit. She developed a cough and mouth soreness with this medication and had to stop it after about 7 days. Symptoms resolved when she stopped it. Tried to get Adcirca for her. This was denied by her insurance company, so she was started on Revatio 20 mg tid instead. She thinks that this has helped her breathing "a little." She saw a pulmonary specialist who thought dyspnea was likely due to [facility]. Next, tried her on ambrisentan, but she developed facial swelling on ambrisentan (significant around the eyes). She also developed worsening dyspnea. She stopped ambrisentan  and the swelling resolved but she was noted to be volume overloaded. Switched her Lasix to torsemide.  She was started on selexipag which she has tolerated.   She had a difficult winter with episode of PNA in 2/20.  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 There was moderate emphysema though she never smoked.  She says that her pulmonologist in [facility] [place] followed up on her CT with a PET scan that was normal.   She had a COVID-19 infection in 12/20 but appears to have recovered well.   She returns today followup of pulmonary hypertension.  Weight is down 1 lb.  She still has lightheaded spells but has not passed out or fallen.  She started taking midodrine 5 mg tid but says that her BP got too high.  Currently, only taking midodrine when her BP is low (<100 systolic).  Her BP continues to fluctuate significantly (SBP 90s-180s).  Breathing stable, able to do ADLs without significant dyspnea.  No chest pain.  No orthopnea/PND.   ECG (personally reviewed): Atrial fibrillation, LAFB.   Labs (9/16): K 4, creatinine 1.31, BNP 466 Labs (11/16): BNP 577 Labs (12/16); K 4, creatinine 1.3, normal rheumatoid factor and scleroderma serologies Labs (1/17): ANA positive but only 1:80 titer, dsDNA negative, SSA/B negative, CCP negative.  Labs (2/17): K 4.1, creatinine 1.48 => 1.42, BNP 487 Labs (3/17): K 4.2, creatinine 1.58, BNP 326 Labs (7/17): K 3.3, creatinine 1.7, hgb 10.7 Labs (8/17): K 3.9, creatinine 1.87,  HCT 35.6 Labs (1/18): hgb 9.9, creatinine 1.42 Labs (4/18): TSH normal, hgb 10.5, K 3.8, creatinine 1.34 Labs (6/18): BNP 415, K 4.9, creatinine 1.45 Labs (1/19): K 4.4, creatinine 1.56, hgb 11.3 Labs (7/19): K 4, creatinine 1.5, BNP 381 Labs (1/20): K 3.8, creatinine 1.38 Labs (11/20): K 3.8, creatinine 1.59  PMH: 1. Cardiolite (9/16) with EF 54%, no ischemia/infarction. 2. Chronic  diastolic CHF: Echo (24/58) with EF 55-60%, normal RV size and systolic function, PA systolic pressure 66 mmHg.  - Echo (7/17) with EF 60-65%, mildly dilated RV with mildly decreased systolic function, PA systolic pressure 36 mmHg.  - Echo (7/18) with EF 55-60%, mild to moderate AI, normal RV size and systolic function, PASP 43 mmHg.  - Echo (1/20) with EF 55-60%, normal RV size and systolic function, mild AI, mild MR.  3. Chronic atrial fibrillation: She has been intolerant of Multaq and propafenone.  Sotalol and flecainide have not been used due to CKD.   4. SBO: Managed conservatively. 5. CKD 6. OA: h/o THR and TKR.  7. H/o hyponatremia 8. Pneumonia with parapneumonic effusion with thoracenteses and eventual VATS and decortication.  9. Pulmonary hypertension: Suspect Group 1 pulmonary hypertension.  RHC (12/16) with mean RA 5, PA 62/22 mean 40, mean PCWP 19, CI 2.46, PVR 5 WU.  PFTs (12/16) wiith FVC 71%, FEV1 73%, ratio 104%, TLC 71%, DLCO 53% => moderate restriction concerning for interstitial lung disease.  High resolution CT (12/16) did not show evidence for interstitial lung disease.  V/Q scan (12/16) did not show evidence for chronic PE.  Serologies: RF negative, scleroderma antibodies negative, ANA positive but only 1:80 titer, SSA/B negative, dsDNA negative, CCP negative.  She did not tolerate macitentan or ambrisentan.  Echo 0/99 with PA systolic pressure down to 36 mmHg.   - 6 minute walk (12/16) with 244 meters - 6 minute walk (2/17) with 232 meters - 6 minute walk (3/17) with 414 meters - 6 minute walk (4/17) with 267 meters - 6 minute walk (8/17) with 241 meters - 6 minute walk (10/17) with 256 meters - 6 minute walk (6/18) with 305 meters - 6 minute walk (9/18) with 320 meters - 6 minute walk (1/19) with 213 meters - 6 minute walk (7/19) with 335 meters - 6 minute walk (1/20) with 213 meters - 6 minute walk (10/20) with 243 meters - 6 minute walk (2/21) with 243 meters 10.  Lightheadedness/syncope: Extensive workup.  Event and holter monitors as well as telemetry monitoring negative.  She has not been orthostatic.  - Did not tolerate pyridostigmine due to hot flashes.  11. Sciatica: Status post back surgery 1/18.  12. COVID-19 infection 12/20.    Current Outpatient Medications  Medication Sig Dispense Refill  . gabapentin (NEURONTIN) 600 MG tablet Take 600 mg by mouth 3 (three) times daily as needed (pain).    . metoprolol succinate (TOPROL XL) 25 MG 24 hr tablet Take 0.5 tablets (12.5 mg total) by mouth every evening. 45 tablet 3  . midodrine (PROAMATINE) 5 MG tablet Take 1 tablet (5 mg total) by mouth 3 (three) times daily with meals. 90 tablet 5  . Potassium Chloride ER 20 MEQ TBCR Take 20 mEq by mouth 2 (two) times daily. 180 tablet 3  . Rivaroxaban (XARELTO) 15 MG TABS tablet Take 15 mg by mouth daily with supper.     . sildenafil (REVATIO) 20 MG tablet TAKE 4 TABLETS THREE TIMES A DAY (CHANGE IN DOSAGE) 1140 tablet 4  .  torsemide (DEMADEX) 20 MG tablet Take 2 tablets (40 mg total) by mouth daily. 180 tablet 3  . UPTRAVI 1000 MCG TABS Take 1 tablet by mouth twice daily. 60 tablet 11   No current facility-administered medications for this encounter.    Allergies:   Acetaminophen, Hydrocodone-acetaminophen, Letairis [ambrisentan], Opsumit [macitentan], Ace inhibitors, Atenolol, Hctz [hydrochlorothiazide], Oxycodone, and Diltiazem hcl   Social History:  The patient  reports that she has never smoked. She has never used smokeless tobacco. She reports that she does not drink alcohol or use drugs.   Family History:  The patient's family history includes Cancer in her mother.   ROS:  Please see the history of present illness.   All other systems are personally reviewed and negative.   Exam:   BP (!) 163/86   Pulse 80   Wt 59.2 kg (130 lb 9.6 oz)   SpO2 99%   BMI 20.45 kg/m  General: NAD Neck: No JVD, no thyromegaly or thyroid nodule.  Lungs: Clear to  auscultation bilaterally with normal respiratory effort. CV: Nondisplaced PMI.  Heart irregular S1/S2, no S3/S4, 1/6 HSM LLSB.  No peripheral edema.  No carotid bruit.  Normal pedal pulses.  Abdomen: Soft, nontender, no hepatosplenomegaly, no distention.  Skin: Intact without lesions or rashes.  Neurologic: Alert and oriented x 3.  Psych: Normal affect. Extremities: No clubbing or cyanosis.  HEENT: Normal.   Recent Labs: 11/03/2018: Hemoglobin 11.1; Platelets 208 11/17/2018: BUN 29; Creatinine, Ser 1.59; Potassium 3.8; Sodium 139  Personally reviewed   Wt Readings from Last 3 Encounters:  02/05/19 59.2 kg (130 lb 9.6 oz)  11/03/18 59.8 kg (131 lb 12.8 oz)  01/18/18 60.8 kg (134 lb)     ASSESSMENT AND PLAN: 1. Chronic diastolic CHF: NYHA class II symptoms (stable) and weight down 1 lb.   -Continue torsemide60 mg daily, BMET + BNP today.    2. Pulmonary hypertension: Moderate to severe pulmonary hypertension by echo (PASP 66 mmHg). RHC showed moderate pulmonary arterial hypertension with PVR 5 WU. PFTs with restrictive changes suggestive of interstitial process but high resolution CT did not show interstitial lung disease. No evidence for chronic PE on V/Q scan. Rheumatologic serologies all negative. Possible group 1 pulmonary hypertension. She was unable to tolerate macitentan or ambrisentan. 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 She is now on sildenafil 80 mg tid and selexipag 1000 mcg bid, unable to increase selexipag further due to symptoms. Echo in 1/20 showed preserved RV systolic function and PASP 49 mmHg. Stable 6 minute walk today.  - Continue Selexipag at 1000 mcg bid, unable to tolerate uptitration of Selexipag any further.  - Continue sildenafil 80 mg three times a day.  - Repeat echo with followup in 3 months.  3. Pulmonary: s/p PNA with parapneumonic effusion and eventual VATS with decortication. As above, restrictive PFTs but no ILD on high resolution CT.  Most  recent CT in 4/20 showed a concerning RLL nodule and emphysema, though she never smoked. Her pulmonologist ordered a PET scan after this per her report, and PET was normal.  4. Atrial fibrillation: Chronic. Following rate control/anticoagulation strategy.  - Continue Toprol XL. - Continue Xarelto 15 mg daily, no bleeding problems.  CBC today.  5. Syncope/lightheadedness: Has had syncopal episodes for >5 years now. They always occur when standing, but not immediately after standing. She has not been orthostatic when checked at past appointments. No arrhythmia has been found, she has worn monitors in the past and was on telemetry in  the hospital =>had syncope at [facility] in 7/17 but no events on telemetry. She has been seen by neurology. ?vasovagal. She was on pyridostigmine but says it caused hot flashes so she stopped it.  She is now taking midodrine when SBP < 100.  I think that this is reasonable, strategy seems to be working reasonably well.     6. CKD: Stage 3. BMET.    Followup in 3 months with echo.   Signed, [name], MD  02/05/2019  [facility] [place] [address] [facility] [place] [address] [phone] (office) [phone] (fax)

## 2019-02-05 NOTE — Patient Instructions (Signed)
Routine lab work today. Will notify you of abnormal results  Follow up and echo in 3 months  

## 2019-02-05 NOTE — Progress Notes (Signed)
6 min walk completed  Starting O2- 98% Ending O2- 95%  Starting heart rate - 73 Ending heart rate - 88  Walked 844ft (243.84 meters)

## 2019-04-17 ENCOUNTER — Other Ambulatory Visit (HOSPITAL_COMMUNITY): Payer: Self-pay | Admitting: Cardiology

## 2019-04-26 ENCOUNTER — Other Ambulatory Visit (HOSPITAL_COMMUNITY): Payer: Self-pay | Admitting: Cardiology

## 2019-05-08 ENCOUNTER — Other Ambulatory Visit: Payer: Self-pay

## 2019-05-08 ENCOUNTER — Ambulatory Visit (HOSPITAL_BASED_OUTPATIENT_CLINIC_OR_DEPARTMENT_OTHER)
Admission: RE | Admit: 2019-05-08 | Discharge: 2019-05-08 | Disposition: A | Discharge status: Home / Self Care | Payer: Medicare Other | Source: Ambulatory Visit | Attending: Cardiology | Admitting: Cardiology

## 2019-05-08 ENCOUNTER — Encounter (HOSPITAL_COMMUNITY): Payer: Self-pay | Admitting: Cardiology

## 2019-05-08 ENCOUNTER — Ambulatory Visit (HOSPITAL_COMMUNITY)
Admission: RE | Admit: 2019-05-08 | Discharge: 2019-05-08 | Disposition: A | Discharge status: Home / Self Care | Payer: Medicare Other | Source: Ambulatory Visit | Attending: Cardiology | Admitting: Cardiology

## 2019-05-08 VITALS — BP 150/90 | HR 79 | Wt 130.4 lb

## 2019-05-08 DIAGNOSIS — I5032 Chronic diastolic (congestive) heart failure: Secondary | ICD-10-CM

## 2019-05-08 DIAGNOSIS — I482 Chronic atrial fibrillation, unspecified: Secondary | ICD-10-CM

## 2019-05-08 DIAGNOSIS — Z8616 Personal history of COVID-19: Secondary | ICD-10-CM | POA: Diagnosis not present

## 2019-05-08 DIAGNOSIS — I130 Hypertensive heart and chronic kidney disease with heart failure and stage 1 through stage 4 chronic kidney disease, or unspecified chronic kidney disease: Secondary | ICD-10-CM | POA: Diagnosis not present

## 2019-05-08 DIAGNOSIS — Z79899 Other long term (current) drug therapy: Secondary | ICD-10-CM | POA: Diagnosis not present

## 2019-05-08 DIAGNOSIS — I951 Orthostatic hypotension: Secondary | ICD-10-CM | POA: Diagnosis not present

## 2019-05-08 DIAGNOSIS — I272 Pulmonary hypertension, unspecified: Secondary | ICD-10-CM | POA: Insufficient documentation

## 2019-05-08 DIAGNOSIS — G629 Polyneuropathy, unspecified: Secondary | ICD-10-CM | POA: Diagnosis not present

## 2019-05-08 DIAGNOSIS — I350 Nonrheumatic aortic (valve) stenosis: Secondary | ICD-10-CM | POA: Diagnosis not present

## 2019-05-08 DIAGNOSIS — Z7901 Long term (current) use of anticoagulants: Secondary | ICD-10-CM | POA: Insufficient documentation

## 2019-05-08 DIAGNOSIS — Z885 Allergy status to narcotic agent status: Secondary | ICD-10-CM | POA: Diagnosis not present

## 2019-05-08 DIAGNOSIS — J439 Emphysema, unspecified: Secondary | ICD-10-CM | POA: Diagnosis not present

## 2019-05-08 DIAGNOSIS — N183 Chronic kidney disease, stage 3 unspecified: Secondary | ICD-10-CM | POA: Diagnosis not present

## 2019-05-08 LAB — BASIC METABOLIC PANEL
Anion gap: 12 (ref 5–15)
BUN: 24 mg/dL — ABNORMAL HIGH (ref 8–23)
CO2: 24 mmol/L (ref 22–32)
Calcium: 9.4 mg/dL (ref 8.9–10.3)
Chloride: 100 mmol/L (ref 98–111)
Creatinine, Ser: 1.41 mg/dL — ABNORMAL HIGH (ref 0.44–1.00)
GFR calc Af Amer: 39 mL/min — ABNORMAL LOW (ref >60)
GFR calc non Af Amer: 33 mL/min — ABNORMAL LOW (ref >60)
Glucose, Bld: 102 mg/dL — ABNORMAL HIGH (ref 70–99)
Potassium: 3.8 mmol/L (ref 3.5–5.1)
Sodium: 136 mmol/L (ref 135–145)

## 2019-05-08 LAB — CBC
HCT: 34.8 % — ABNORMAL LOW (ref 36.0–46.0)
Hemoglobin: 10.8 g/dL — ABNORMAL LOW (ref 12.0–15.0)
MCH: 27.2 pg (ref 26.0–34.0)
MCHC: 31 g/dL (ref 30.0–36.0)
MCV: 87.7 fL (ref 80.0–100.0)
Platelets: 303 10*3/uL (ref 150–400)
RBC: 3.97 MIL/uL (ref 3.87–5.11)
RDW: 15.5 % (ref 11.5–15.5)
WBC: 6.9 10*3/uL (ref 4.0–10.5)
nRBC: 0 % (ref 0.0–0.2)

## 2019-05-08 MED ORDER — MIDODRINE HCL 5 MG PO TABS: 5.0000 mg | ORAL_TABLET | Freq: Every day | ORAL | 1 refills | Status: DC

## 2019-05-08 MED ORDER — TORSEMIDE 20 MG PO TABS: 20.0000 mg | ORAL_TABLET | Freq: Every day | ORAL | 3 refills | Status: DC

## 2019-05-08 NOTE — Progress Notes (Signed)
Date:  05/08/2019   ID:  Rachael Myers, DOB 11-29-1929, MRN [id number]  Provider location: [facility] Type of Visit: Established patient  PCP:  Myrtis Hopping., MD  Cardiologist:  Dr. Aundra Dubin  Chief Complaint: Shortness of breath   History of Present Illness: Rachael Myers is a 86 y.o. female who has a history of chronic diastolic CHF, chronic atrial fibrillation, and prominent exertional dyspnea.  She has had atrial fibrillation long-term and has failed Multaq and propafenone use.  She is in atrial fibrillation chronically now.  In 1/16, she developed PNA.  She had a parapneumonic effusion that required 4 thoracenteses.  Eventually, she had VATS with decortication and a wedge resection.  Cytology was negative from the pleural fluid.  Last chest CT was at [facility] in 8/16 and showed no PE, mild interstitial edema.  Last echo was done in 11/16, showing normal LV EF and apparently normal RV EF.  PA systolic pressure estimation was moderate to severely increased.  I took her for [place] in 12/16, this showed moderate pulmonary arterial hypertension.  PFTs showed a restrictive pattern concerning for interstitial lung disease.  V/Q scan in 12/16 was not suggestive of chronic PE.  Despite abnormal PFTs, high resolution CT did not show interstitial lung disease.    At a prior appointment, started her on Opsumit. She developed a cough and mouth soreness with this medication and had to stop it after about 7 days. Symptoms resolved when she stopped it. Tried to get Adcirca for her. This was denied by her insurance company, so she was started on Revatio 20 mg tid instead. She thinks that this has helped her breathing "a little." She saw a pulmonary specialist who thought dyspnea was likely due to [facility]. Next, tried her on ambrisentan, but she developed facial swelling on ambrisentan (significant around the eyes). She also developed worsening dyspnea. She stopped ambrisentan  and the swelling resolved but she was noted to be volume overloaded. Switched her Lasix to torsemide.  She was started on selexipag which she has tolerated.   She had a difficult winter with episode of PNA in 2/20.  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 There was moderate emphysema though she never smoked.  She says that her pulmonologist in [facility] followed up on her CT with a PET scan that was normal.   She had a COVID-19 infection in 12/20 but appears to have recovered well.   Echo was done today and reviewed, EF 60-65%, mild LVH, PASP 33 mmHg, normal RV, mild MR, mild-moderate AS with mean gradient 14 mmHg, AVA 1.23 cm^2.   She returns today followup of pulmonary hypertension and orthostatic hypotension.  She continues to have difficulty with orthostatic hypotension and supine hypertension.  She says that her BP is high when she gets up, drops to SBP 80s-90s in the later morning, then rises again in the evening.  She takes midodrine when she gets low BP readings.  She gets dizzy when BP drops but has not passed out or fallen. She is wearing her compression stockings.  She denies significant exertional dyspnea.  No problems walking on flat ground. She has burning in her feet bilaterally, this has been attributed to peripheral neuropathy and she is on gabapentin though the gabapentin does not work well.  Weight stable.   Labs (9/16): K 4, creatinine 1.31, BNP 466 Labs (11/16):  BNP 577 Labs (12/16); K 4, creatinine 1.3, normal rheumatoid factor and scleroderma serologies Labs (1/17): ANA positive but only 1:80 titer, dsDNA negative, SSA/B negative, CCP negative.  Labs (2/17): K 4.1, creatinine 1.48 => 1.42, BNP 487 Labs (3/17): K 4.2, creatinine 1.58, BNP 326 Labs (7/17): K 3.3, creatinine 1.7, hgb 10.7 Labs (8/17): K 3.9, creatinine 1.87, HCT 35.6 Labs (1/18): hgb 9.9, creatinine  1.42 Labs (4/18): TSH normal, hgb 10.5, K 3.8, creatinine 1.34 Labs (6/18): BNP 415, K 4.9, creatinine 1.45 Labs (1/19): K 4.4, creatinine 1.56, hgb 11.3 Labs (7/19): K 4, creatinine 1.5, BNP 381 Labs (1/20): K 3.8, creatinine 1.38 Labs (11/20): K 3.8, creatinine 1.59  PMH: 1. Cardiolite (9/16) with EF 54%, no ischemia/infarction. 2. Chronic diastolic CHF: Echo (11/16) with EF 55-60%, normal RV size and systolic function, PA systolic pressure 66 mmHg.  - Echo (7/17) with EF 60-65%, mildly dilated RV with mildly decreased systolic function, PA systolic pressure 36 mmHg.  - Echo (7/18) with EF 55-60%, mild to moderate AI, normal RV size and systolic function, PASP 43 mmHg.  - Echo (1/20) with EF 55-60%, normal RV size and systolic function, mild AI, mild MR.  - Echo (5/21) witih EF 60-65%, mild LVH, PASP 33 mmHg, normal RV, mild MR, mild-moderate AS with mean gradient 14 mmHg, AVA 1.23 cm^2.  3. Chronic atrial fibrillation: She has been intolerant of Multaq and propafenone.  Sotalol and flecainide have not been used due to CKD.   4. SBO: Managed conservatively. 5. CKD 6. OA: h/o THR and TKR.  7. H/o hyponatremia 8. Pneumonia with parapneumonic effusion with thoracenteses and eventual VATS and decortication.  9. Pulmonary hypertension: Suspect Group 1 pulmonary hypertension.  RHC (12/16) with mean RA 5, PA 62/22 mean 40, mean PCWP 19, CI 2.46, PVR 5 WU.  PFTs (12/16) wiith FVC 71%, FEV1 73%, ratio 104%, TLC 71%, DLCO 53% => moderate restriction concerning for interstitial lung disease.  High resolution CT (12/16) did not show evidence for interstitial lung disease.  V/Q scan (12/16) did not show evidence for chronic PE.  Serologies: RF negative, scleroderma antibodies negative, ANA positive but only 1:80 titer, SSA/B negative, dsDNA negative, CCP negative.  She did not tolerate macitentan or ambrisentan.  Echo 7/17 with PA systolic pressure down to 36 mmHg.   - 6 minute walk (12/16) with 244  meters - 6 minute walk (2/17) with 232 meters - 6 minute walk (3/17) with 414 meters - 6 minute walk (4/17) with 267 meters - 6 minute walk (8/17) with 241 meters - 6 minute walk (10/17) with 256 meters - 6 minute walk (6/18) with 305 meters - 6 minute walk (9/18) with 320 meters - 6 minute walk (1/19) with 213 meters - 6 minute walk (7/19) with 335 meters - 6 minute walk (1/20) with 213 meters - 6 minute walk (10/20) with 243 meters - 6 minute walk (2/21) with 243 meters 10. Lightheadedness/syncope: Extensive workup.  Event and holter monitors as well as telemetry monitoring negative.  She has not been orthostatic.  - Did not tolerate pyridostigmine due to hot flashes.  11. Sciatica: Status post back surgery 1/18.  12. COVID-19 infection 12/20.  13. Peripheral neuropathy 14. Aortic stenosis: Mild to moderate on 5/21 echo.    Current Outpatient Medications  Medication Sig Dispense Refill  . alendronate (FOSAMAX) 70 MG tablet Take 70 mg by mouth once a week.    . gabapentin (NEURONTIN) 600 MG tablet Take 600 mg by mouth 3 (  three) times daily as needed (pain).    Marland Kitchen ipratropium-albuterol (DUONEB) 0.5-2.5 (3) MG/3ML SOLN USE 1 VIAL IN NEBULIZER EVERY 6 HOURS - And As Needed    . metoprolol succinate (TOPROL-XL) 25 MG 24 hr tablet TAKE ONE-HALF (1/2) TABLET EVERY EVENING 45 tablet 3  . midodrine (PROAMATINE) 5 MG tablet Take 1 tablet (5 mg total) by mouth daily. Take 2hrs after you wake up in the morning 90 tablet 1  . Potassium Chloride ER 20 MEQ TBCR Take 20 mEq by mouth 2 (two) times daily. 180 tablet 3  . Rivaroxaban (XARELTO) 15 MG TABS tablet Take 15 mg by mouth daily with supper.     . sildenafil (REVATIO) 20 MG tablet Take 4 tablets (80 mg total) by mouth 3 (three) times daily. 360 tablet 11  . torsemide (DEMADEX) 20 MG tablet Take 1 tablet (20 mg total) by mouth daily. 90 tablet 3  . UPTRAVI 1000 MCG TABS Take 1 tablet by mouth twice daily. 60 tablet 11   No current  facility-administered medications for this encounter.    Allergies:   Acetaminophen, Hydrocodone-acetaminophen, Letairis [ambrisentan], Opsumit [macitentan], Ace inhibitors, Atenolol, Hctz [hydrochlorothiazide], Oxycodone, and Diltiazem hcl   Social History:  The patient  reports that she has never smoked. She has never used smokeless tobacco. She reports that she does not drink alcohol or use drugs.   Family History:  The patient's family history includes Cancer in her mother.   ROS:  Please see the history of present illness.   All other systems are personally reviewed and negative.   Exam:   BP (!) 150/90   Pulse 79   Wt 59.1 kg (130 lb 6.4 oz)   SpO2 98%   BMI 20.42 kg/m  General: NAD Neck: No JVD, no thyromegaly or thyroid nodule.  Lungs: Clear to auscultation bilaterally with normal respiratory effort. CV: Nondisplaced PMI.  Heart regular S1/S2, no S3/S4, 2/6 SEM RUSB with clear S2.  No peripheral edema.  No carotid bruit.  Normal pedal pulses.  Abdomen: Soft, nontender, no hepatosplenomegaly, no distention.  Skin: Intact without lesions or rashes.  Neurologic: Alert and oriented x 3.  Psych: Normal affect. Extremities: No clubbing or cyanosis.  HEENT: Normal.   Recent Labs: 05/08/2019: BUN 24; Creatinine, Ser 1.41; Hemoglobin 10.8; Platelets 303; Potassium 3.8; Sodium 136  Personally reviewed   Wt Readings from Last 3 Encounters:  05/08/19 59.1 kg (130 lb 6.4 oz)  02/05/19 59.2 kg (130 lb 9.6 oz)  11/03/18 59.8 kg (131 lb 12.8 oz)     ASSESSMENT AND PLAN: 1. Chronic diastolic CHF: NYHA class II symptoms (stable) with stable weight.  Not volume overloaded on exam.    -With orthostatic symptoms, I will decrease torsemide to 20 mg daily.     2. Pulmonary hypertension: Moderate to severe pulmonary hypertension by echo (PASP 66 mmHg). RHC showed moderate pulmonary arterial hypertension with PVR 5 WU. PFTs with restrictive changes suggestive of interstitial process but  high resolution CT did not show interstitial lung disease. No evidence for chronic PE on V/Q scan. Rheumatologic serologies all negative. Possible group 1 pulmonary hypertension. She was unable to tolerate macitentan or ambrisentan. 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 She is now on sildenafil 80 mg tid and selexipag 1000 mcg bid, unable to increase selexipag further due to symptoms. Echo was done today and reviewed, showing preserved RV systolic function and PASP 33 mmHg.  - Continue Selexipag at 1000 mcg bid, unable to tolerate uptitration of Selexipag  any further.  - Continue sildenafil 80 mg three times a day.  - Check BNP today, 6 minute walk at next appt.  3. Pulmonary: s/p PNA with parapneumonic effusion and eventual VATS with decortication. As above, restrictive PFTs but no ILD on high resolution CT.  Most recent CT in 4/20 showed a concerning RLL nodule and emphysema, though she never smoked. Her pulmonologist ordered a PET scan after this per her report, and PET was normal.  4. Atrial fibrillation: Chronic. Following rate control/anticoagulation strategy.  - Continue Toprol XL. - Continue Xarelto 15 mg daily, no bleeding problems.  CBC today.  5. Orthostatic hypotension: She has orthostatic hypotension with supine hypertension. She was on pyridostigmine but says it caused hot flashes so she stopped it.  She has been using midodrine prn BP < 100.  It seems that BP is high in the early morning and evening and drops significantly when she is on her feet in the morning.  - I have asked her to take midodrine 5 mg regularly about 2 hours after she wakes up as this should cover her most symptomatic period.  - Continue to wear compression stockings.  - With combination of diastolic CHF, aortic stenosis, painful peripheral neuropathy, and orthostatic hypotension (?autonomic neuropathy), I wonder if she could have cardiac amyloidosis.  I will arrange for PYP scan as well as myeloma panel and  urine immunofixation.  6. CKD: Stage 3. BMET today.   Followup in 3 months  Signed, [name], MD  05/08/2019  [facility] [address] [facility] [place] [address] [phone] (office) [phone] (fax)

## 2019-05-08 NOTE — Patient Instructions (Addendum)
TAKE Midodrine 5mg  (1 tab) daily (2 hrs after you get up)   DECREASE Torsemide to 20mg  (1 tab) daily   Labs today We will only contact you if something comes back abnormal or we need to make some changes. Otherwise no news is good news!   You have been ordered a PYP Scan.  This is done in the Radiology Department of [facility].  When you come for this test please plan to be there 2-3 hours.   Your physician recommends that you schedule a follow-up appointment in: 3 months with Dr .   NEXT APPOINTMENT: Thursday August 5th, 2021 at 11AM. Garage code [id number]   Please call office at [phone] option 2 if you have any questions or concerns.    At the [facility], you and your health needs are our priority. As part of our continuing mission to provide you with exceptional heart care, we have created designated Provider Care Teams. These Care Teams include your primary Cardiologist (physician) and Advanced Practice Providers (APPs- Physician Assistants and Nurse Practitioners) who all work together to provide you with the care you need, when you need it.   You may see any of the following providers on your designated Care Team at your next follow up: [id number] [phone] . Dr Marland Kitchen . Arvilla Meres, NP . [name], PA . Tonye Becket, PharmD   Please be sure to bring in all your medications bottles to every appointment.

## 2019-05-08 NOTE — Progress Notes (Signed)
  Echocardiogram 2D Echocardiogram has been performed.  Rachael Myers 05/08/2019, 12:04 PM

## 2019-05-09 ENCOUNTER — Telehealth (HOSPITAL_COMMUNITY): Payer: Self-pay | Admitting: *Deleted

## 2019-05-09 NOTE — Telephone Encounter (Signed)
PYP. No pre cert reqd.

## 2019-05-10 LAB — IMMUNOFIXATION, URINE

## 2019-05-11 LAB — MULTIPLE MYELOMA PANEL, SERUM
Albumin SerPl Elph-Mcnc: 3.4 g/dL (ref 2.9–4.4)
Albumin/Glob SerPl: 0.9 (ref 0.7–1.7)
Alpha 1: 0.4 g/dL (ref 0.0–0.4)
Alpha2 Glob SerPl Elph-Mcnc: 0.9 g/dL (ref 0.4–1.0)
B-Globulin SerPl Elph-Mcnc: 1.1 g/dL (ref 0.7–1.3)
Gamma Glob SerPl Elph-Mcnc: 1.4 g/dL (ref 0.4–1.8)
Globulin, Total: 3.9 g/dL (ref 2.2–3.9)
IgA: 569 mg/dL — ABNORMAL HIGH (ref 64–422)
IgG (Immunoglobin G), Serum: 2131 mg/dL — ABNORMAL HIGH (ref 586–1602)
IgM (Immunoglobulin M), Srm: 115 mg/dL (ref 26–217)
Total Protein ELP: 7.3 g/dL (ref 6.0–8.5)

## 2019-05-21 ENCOUNTER — Encounter (HOSPITAL_COMMUNITY)
Admission: RE | Admit: 2019-05-21 | Discharge: 2019-05-21 | Disposition: A | Discharge status: Home / Self Care | Payer: Medicare Other | Source: Ambulatory Visit | Attending: Cardiology | Admitting: Cardiology

## 2019-05-21 ENCOUNTER — Other Ambulatory Visit: Payer: Self-pay

## 2019-05-21 DIAGNOSIS — I5032 Chronic diastolic (congestive) heart failure: Secondary | ICD-10-CM | POA: Diagnosis not present

## 2019-05-21 MED ORDER — TECHNETIUM TC 99M PYROPHOSPHATE
21.6000 | Freq: Once | INTRAVENOUS | Status: AC | PRN
  Administered 2019-05-21: 21.6 via INTRAVENOUS
  Filled 2019-05-21: qty 22

## 2019-06-20 ENCOUNTER — Other Ambulatory Visit (HOSPITAL_COMMUNITY): Payer: Self-pay | Admitting: Cardiology

## 2019-06-20 MED ORDER — UPTRAVI 1000 MCG PO TABS: 1.0000 | ORAL_TABLET | Freq: Two times a day (BID) | ORAL | 2 refills | Status: DC

## 2019-08-09 ENCOUNTER — Encounter (HOSPITAL_COMMUNITY): Payer: Medicare Other | Admitting: Cardiology

## 2019-08-13 ENCOUNTER — Ambulatory Visit (HOSPITAL_COMMUNITY)
Admission: RE | Admit: 2019-08-13 | Discharge: 2019-08-13 | Disposition: A | Discharge status: Home / Self Care | Payer: Medicare Other | Source: Ambulatory Visit | Attending: Cardiology | Admitting: Cardiology

## 2019-08-13 ENCOUNTER — Other Ambulatory Visit: Payer: Self-pay

## 2019-08-13 ENCOUNTER — Encounter (HOSPITAL_COMMUNITY): Payer: Self-pay | Admitting: Cardiology

## 2019-08-13 VITALS — BP 150/70 | HR 77 | Ht 67.0 in | Wt 129.8 lb

## 2019-08-13 DIAGNOSIS — I130 Hypertensive heart and chronic kidney disease with heart failure and stage 1 through stage 4 chronic kidney disease, or unspecified chronic kidney disease: Secondary | ICD-10-CM | POA: Diagnosis not present

## 2019-08-13 DIAGNOSIS — Z7901 Long term (current) use of anticoagulants: Secondary | ICD-10-CM | POA: Diagnosis not present

## 2019-08-13 DIAGNOSIS — I5032 Chronic diastolic (congestive) heart failure: Secondary | ICD-10-CM | POA: Insufficient documentation

## 2019-08-13 DIAGNOSIS — Z8701 Personal history of pneumonia (recurrent): Secondary | ICD-10-CM | POA: Insufficient documentation

## 2019-08-13 DIAGNOSIS — Z79899 Other long term (current) drug therapy: Secondary | ICD-10-CM | POA: Diagnosis not present

## 2019-08-13 DIAGNOSIS — J439 Emphysema, unspecified: Secondary | ICD-10-CM | POA: Insufficient documentation

## 2019-08-13 DIAGNOSIS — I482 Chronic atrial fibrillation, unspecified: Secondary | ICD-10-CM

## 2019-08-13 DIAGNOSIS — I350 Nonrheumatic aortic (valve) stenosis: Secondary | ICD-10-CM | POA: Insufficient documentation

## 2019-08-13 DIAGNOSIS — Z9012 Acquired absence of left breast and nipple: Secondary | ICD-10-CM | POA: Diagnosis not present

## 2019-08-13 DIAGNOSIS — Z888 Allergy status to other drugs, medicaments and biological substances status: Secondary | ICD-10-CM | POA: Diagnosis not present

## 2019-08-13 DIAGNOSIS — Z8616 Personal history of COVID-19: Secondary | ICD-10-CM | POA: Insufficient documentation

## 2019-08-13 DIAGNOSIS — Z853 Personal history of malignant neoplasm of breast: Secondary | ICD-10-CM | POA: Insufficient documentation

## 2019-08-13 DIAGNOSIS — I2721 Secondary pulmonary arterial hypertension: Secondary | ICD-10-CM | POA: Diagnosis not present

## 2019-08-13 DIAGNOSIS — I272 Pulmonary hypertension, unspecified: Secondary | ICD-10-CM

## 2019-08-13 DIAGNOSIS — Z792 Long term (current) use of antibiotics: Secondary | ICD-10-CM | POA: Diagnosis not present

## 2019-08-13 DIAGNOSIS — Z885 Allergy status to narcotic agent status: Secondary | ICD-10-CM | POA: Diagnosis not present

## 2019-08-13 DIAGNOSIS — N183 Chronic kidney disease, stage 3 unspecified: Secondary | ICD-10-CM | POA: Insufficient documentation

## 2019-08-13 DIAGNOSIS — I951 Orthostatic hypotension: Secondary | ICD-10-CM | POA: Diagnosis not present

## 2019-08-13 LAB — BASIC METABOLIC PANEL
Anion gap: 13 (ref 5–15)
BUN: 29 mg/dL — ABNORMAL HIGH (ref 8–23)
CO2: 23 mmol/L (ref 22–32)
Calcium: 9 mg/dL (ref 8.9–10.3)
Chloride: 101 mmol/L (ref 98–111)
Creatinine, Ser: 1.72 mg/dL — ABNORMAL HIGH (ref 0.44–1.00)
GFR calc Af Amer: 30 mL/min — ABNORMAL LOW (ref >60)
GFR calc non Af Amer: 26 mL/min — ABNORMAL LOW (ref >60)
Glucose, Bld: 93 mg/dL (ref 70–99)
Potassium: 4.4 mmol/L (ref 3.5–5.1)
Sodium: 137 mmol/L (ref 135–145)

## 2019-08-13 LAB — CBC
HCT: 34.3 % — ABNORMAL LOW (ref 36.0–46.0)
Hemoglobin: 10.2 g/dL — ABNORMAL LOW (ref 12.0–15.0)
MCH: 26.8 pg (ref 26.0–34.0)
MCHC: 29.7 g/dL — ABNORMAL LOW (ref 30.0–36.0)
MCV: 90 fL (ref 80.0–100.0)
Platelets: 213 10*3/uL (ref 150–400)
RBC: 3.81 MIL/uL — ABNORMAL LOW (ref 3.87–5.11)
RDW: 16.5 % — ABNORMAL HIGH (ref 11.5–15.5)
WBC: 6 10*3/uL (ref 4.0–10.5)
nRBC: 0 % (ref 0.0–0.2)

## 2019-08-13 LAB — BRAIN NATRIURETIC PEPTIDE: B Natriuretic Peptide: 314 pg/mL — ABNORMAL HIGH (ref 0.0–100.0)

## 2019-08-13 MED ORDER — METOPROLOL SUCCINATE ER 25 MG PO TB24: 12.5000 mg | ORAL_TABLET | Freq: Every day | ORAL | 3 refills | Status: DC

## 2019-08-13 NOTE — Progress Notes (Signed)
Six Minute Walk - 08/13/19 1443      Six Minute Walk   Supplemental oxygen during test? No    Baseline Heartrate 76    Baseline SPO2 97 %      Interval Oxygen Saturation and HR    2 Minute Oxygen Saturation % 94 %    2 Minute HR 78    4 Minute Oxygen Saturation % 95 %    4 Minute HR 81    6 Minute Oxygen Saturation % 96 %    6 Minute HR 85      End of Test Values   Heartrate 88    SPO2 95 %      2 Minutes Post Walk Values   Heartrate 83    SPO2 96 %      Interpretation   Tech Comments: 771ft;213.31m

## 2019-08-13 NOTE — Patient Instructions (Signed)
Take Metoprolol ER 12.5 mg (1/2 tab) Daily at Bedtime  Labs done today, we will call you for abnormal results  Your physician recommends that you schedule a follow-up appointment in: 4 months  If you have any questions or concerns before your next appointment please send [place] a message through [place] or call our office at [phone].    TO LEAVE A MESSAGE FOR THE NURSE SELECT OPTION 2, PLEASE LEAVE A MESSAGE INCLUDING: . YOUR NAME . DATE OF BIRTH . CALL BACK NUMBER . REASON FOR CALL**this is important as we prioritize the call backs  YOU WILL RECEIVE A CALL BACK THE SAME DAY AS LONG AS YOU CALL BEFORE 4:00 PM  At the [facility], you and your health needs are our priority. As part of our continuing mission to provide you with exceptional heart care, we have created designated Provider Care Teams. These Care Teams include your primary Cardiologist (physician) and Advanced Practice Providers (APPs- Physician Assistants and Nurse Practitioners) who all work together to provide you with the care you need, when you need it.   You may see any of the following providers on your designated Care Team at your next follow up: Marland Kitchen Dr Arvilla Meres . Dr [name] . Tonye Becket, NP . Robbie Lis, PA . Karle Plumber, PharmD   Please be sure to bring in all your medications bottles to every appointment.

## 2019-08-14 NOTE — Progress Notes (Signed)
Date:  08/14/2019   ID:  Rachael Myers, DOB [birth year], MRN [id number]  Provider location: [place] [facility] Type of Visit: Established patient  PCP:  Dan Maker., MD  Cardiologist:  Dr. Shirlee Latch   History of Present Illness: PAISLEI DORVAL is a 86 y.o. female who has a history of chronic diastolic CHF, chronic atrial fibrillation, and prominent exertional dyspnea.  She has had atrial fibrillation long-term and has failed Multaq and propafenone use.  She is in atrial fibrillation chronically now.  In 1/16, she developed PNA.  She had a parapneumonic effusion that required 4 thoracenteses.  Eventually, she had VATS with decortication and a wedge resection.  Cytology was negative from the pleural fluid.  Last chest CT was at [facility] in 8/16 and showed no PE, mild interstitial edema.  Last echo was done in 11/16, showing normal LV EF and apparently normal RV EF.  PA systolic pressure estimation was moderate to severely increased.  I took her for RHC in 12/16, this showed moderate pulmonary arterial hypertension.  PFTs showed a restrictive pattern concerning for interstitial lung disease.  V/Q scan in 12/16 was not suggestive of chronic PE.  Despite abnormal PFTs, high resolution CT did not show interstitial lung disease.    At a prior appointment, started her on Opsumit. She developed a cough and mouth soreness with this medication and had to stop it after about 7 days. Symptoms resolved when she stopped it. Tried to get Adcirca for her. This was denied by her insurance company, so she was started on Revatio 20 mg tid instead. She thinks that this has helped her breathing "a little." She saw a pulmonary specialist who thought dyspnea was likely due to [facility]. Next, tried her on ambrisentan, but she developed facial swelling on ambrisentan (significant around the eyes). She also developed worsening dyspnea. She stopped ambrisentan and the swelling resolved but she was  noted to be volume overloaded. Switched her Lasix to torsemide.  She was started on selexipag which she has tolerated.   She had a difficult winter with episode of PNA in 2/20.  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 There was moderate emphysema though she never smoked.  She says that her pulmonologist in [facility] followed up on her CT with a PET scan that was normal.   She had a COVID-19 infection in 12/20 but appears to have recovered well.   Echo in 5/21 showed EF 60-65%, mild LVH, PASP 33 mmHg, normal RV, mild MR, mild-moderate AS with mean gradient 14 mmHg, AVA 1.23 cm^2.  PYP scan in 5/21 was not suggestive of TTR amyloidosis.   [name] and had mastectomy (no chemo or radiation).   She returns today followup of pulmonary hypertension and orthostatic hypotension.  She continues to have difficulty with orthostatic hypotension and supine hypertension.  She says that her BP is high when she gets up, drops to SBP 80s-90s in the later morning, then rises again in the evening.  Taking midodrine once in the mid-morning helps prevent the significant BP drops.  No falls. She has been feeling generally good, no dyspnea walking on flat ground.  No chest pain.  Weight down 1 lb.   Labs (9/16): K 4, creatinine 1.31, BNP 466 Labs (11/16): BNP 577 Labs (12/16); K 4, creatinine 1.3, normal rheumatoid factor and scleroderma serologies  Labs (1/17): ANA positive but only 1:80 titer, dsDNA negative, SSA/B negative, CCP negative.  Labs (2/17): K 4.1, creatinine 1.48 => 1.42, BNP 487 Labs (3/17): K 4.2, creatinine 1.58, BNP 326 Labs (7/17): K 3.3, creatinine 1.7, hgb 10.7 Labs (8/17): K 3.9, creatinine 1.87, HCT 35.6 Labs (1/18): hgb 9.9, creatinine 1.42 Labs (4/18): TSH normal, hgb 10.5, K 3.8, creatinine 1.34 Labs (6/18): BNP 415, K 4.9, creatinine 1.45 Labs  (1/19): K 4.4, creatinine 1.56, hgb 11.3 Labs (7/19): K 4, creatinine 1.5, BNP 381 Labs (1/20): K 3.8, creatinine 1.38 Labs (11/20): K 3.8, creatinine 1.59 Labs (5/21): K 3.8, creatinine 1.4, myeloma panel negative, urine immunofixation negative  PMH: 1. Cardiolite (9/16) with EF 54%, no ischemia/infarction. 2. Chronic diastolic CHF: Echo (11/16) with EF 55-60%, normal RV size and systolic function, PA systolic pressure 66 mmHg.  - Echo (7/17) with EF 60-65%, mildly dilated RV with mildly decreased systolic function, PA systolic pressure 36 mmHg.  - Echo (7/18) with EF 55-60%, mild to moderate AI, normal RV size and systolic function, PASP 43 mmHg.  - Echo (1/20) with EF 55-60%, normal RV size and systolic function, mild AI, mild MR.  - Echo (5/21) witih EF 60-65%, mild LVH, PASP 33 mmHg, normal RV, mild MR, mild-moderate AS with mean gradient 14 mmHg, AVA 1.23 cm^2.  - PYP scan (5/21): grade 0, H/CL 1.1 (not suggestive of TTR amyloidosis).  3. Chronic atrial fibrillation: She has been intolerant of Multaq and propafenone.  Sotalol and flecainide have not been used due to CKD.   4. SBO: Managed conservatively. 5. CKD 6. OA: h/o THR and TKR.  7. H/o hyponatremia 8. Pneumonia with parapneumonic effusion with thoracenteses and eventual VATS and decortication.  9. Pulmonary hypertension: Suspect Group 1 pulmonary hypertension.  RHC (12/16) with mean RA 5, PA 62/22 mean 40, mean PCWP 19, CI 2.46, PVR 5 WU.  PFTs (12/16) wiith FVC 71%, FEV1 73%, ratio 104%, TLC 71%, DLCO 53% => moderate restriction concerning for interstitial lung disease.  High resolution CT (12/16) did not show evidence for interstitial lung disease.  V/Q scan (12/16) did not show evidence for chronic PE.  Serologies: RF negative, scleroderma antibodies negative, ANA positive but only 1:80 titer, SSA/B negative, dsDNA negative, CCP negative.  She did not tolerate macitentan or ambrisentan.  Echo 7/17 with PA systolic pressure down  to 36 mmHg.   - 6 minute walk (12/16) with 244 meters - 6 minute walk (2/17) with 232 meters - 6 minute walk (3/17) with 414 meters - 6 minute walk (4/17) with 267 meters - 6 minute walk (8/17) with 241 meters - 6 minute walk (10/17) with 256 meters - 6 minute walk (6/18) with 305 meters - 6 minute walk (9/18) with 320 meters - 6 minute walk (1/19) with 213 meters - 6 minute walk (7/19) with 335 meters - 6 minute walk (1/20) with 213 meters - 6 minute walk (10/20) with 243 meters - 6 minute walk (2/21) with 243 meters - 6 minute walk (8/21) with 214 meteres 10. Lightheadedness/syncope: Extensive workup.  Event and holter monitors as well as telemetry monitoring negative.  She has not been orthostatic.  - Did not tolerate pyridostigmine due to hot flashes.  11. Sciatica: Status post back surgery 1/18.  12. COVID-19 infection 12/20.  13. Peripheral neuropathy 14. Aortic stenosis: Mild to moderate on 5/21 echo.  15. Breast cancer: Triple negative on left, s/p mastectomy in 7/21.    Current Outpatient Medications  Medication Sig  Dispense Refill  . amoxicillin (AMOXIL) 500 MG capsule TAKE 4 CAPSULES BY MOUTH ONE HOUR PRIOR TO DENTAL APPOINTMENT    . gabapentin (NEURONTIN) 300 MG capsule Take 300 mg by mouth daily.    Marland Kitchen ipratropium-albuterol (DUONEB) 0.5-2.5 (3) MG/3ML SOLN USE 1 VIAL IN NEBULIZER EVERY 6 HOURS - And As Needed    . metoprolol succinate (TOPROL-XL) 25 MG 24 hr tablet Take 0.5 tablets (12.5 mg total) by mouth at bedtime. 45 tablet 3  . midodrine (PROAMATINE) 5 MG tablet Take 1 tablet (5 mg total) by mouth daily. Take 2hrs after you wake up in the morning 90 tablet 1  . potassium chloride (KLOR-CON) 10 MEQ tablet Take by mouth.    . Rivaroxaban (XARELTO) 15 MG TABS tablet Take 15 mg by mouth daily with supper.     . sildenafil (REVATIO) 20 MG tablet Take 4 tablets (80 mg total) by mouth 3 (three) times daily. 360 tablet 11  . torsemide (DEMADEX) 20 MG tablet Take 1 tablet  (20 mg total) by mouth daily. 90 tablet 3  . UPTRAVI 1000 MCG TABS Take 1 tablet by mouth 2 (two) times daily. 60 tablet 2   No current facility-administered medications for this encounter.    Allergies:   Acetaminophen, Hydrocodone-acetaminophen, Letairis [ambrisentan], Opsumit [macitentan], Ace inhibitors, Atenolol, Hctz [hydrochlorothiazide], Oxycodone, and Diltiazem hcl   Social History:  The patient  reports that she has never smoked. She has never used smokeless tobacco. She reports that she does not drink alcohol and does not use drugs.   Family History:  The patient's family history includes Cancer in her mother.   ROS:  Please see the history of present illness.   All other systems are personally reviewed and negative.   Exam:   BP (!) 150/70   Pulse 77   Ht 5\' 7"  (1.702 m)   Wt 58.9 kg (129 lb 12.8 oz)   SpO2 97%   BMI 20.33 kg/m  General: NAD Neck: No JVD, no thyromegaly or thyroid nodule.  Lungs: Clear to auscultation bilaterally with normal respiratory effort. CV: Nondisplaced PMI.  Heart regular S1/S2, no S3/S4, 1/6 SEM RUSB.  No peripheral edema.  No carotid bruit.  Normal pedal pulses.  Abdomen: Soft, nontender, no hepatosplenomegaly, no distention.  Skin: Intact without lesions or rashes.  Neurologic: Alert and oriented x 3.  Psych: Normal affect. Extremities: No clubbing or cyanosis.  HEENT: Normal.   Recent Labs: 08/13/2019: B Natriuretic Peptide 314.0; BUN 29; Creatinine, Ser 1.72; Hemoglobin 10.2; Platelets 213; Potassium 4.4; Sodium 137  Personally reviewed   Wt Readings from Last 3 Encounters:  08/13/19 58.9 kg (129 lb 12.8 oz)  05/08/19 59.1 kg (130 lb 6.4 oz)  02/05/19 59.2 kg (130 lb 9.6 oz)     ASSESSMENT AND PLAN: 1. Chronic diastolic CHF: NYHA class II symptoms (stable) with stable weight.  Not volume overloaded on exam.    -Continue torsemide 20 mg daily. BMET/BNP today.     2. Pulmonary hypertension: Moderate to severe pulmonary  hypertension by echo (PASP 66 mmHg). RHC showed moderate pulmonary arterial hypertension with PVR 5 WU. PFTs with restrictive changes suggestive of interstitial process but high resolution CT did not show interstitial lung disease. No evidence for chronic PE on V/Q scan. Rheumatologic serologies all negative. Possible group 1 pulmonary hypertension. She was unable to tolerate macitentan or ambrisentan. Insurance would not approve 04/05/19. She is now on sildenafil 80 mg tid and selexipag 1000 mcg bid, unable to increase  selexipag further due to symptoms. Echo in 5/21 showed preserved RV systolic function and PASP 33 mmHg. 6 minute walk today mildly less than prior.  - Continue Selexipag at 1000 mcg bid, unable to tolerate uptitration of Selexipag any further.  - Continue sildenafil 80 mg three times a day.  - Check BNP today.  3. Pulmonary: s/p PNA with parapneumonic effusion and eventual VATS with decortication. As above, restrictive PFTs but no ILD on high resolution CT.  Most recent CT in 4/20 showed a concerning RLL nodule and emphysema, though she never smoked. Her pulmonologist ordered a PET scan after this per her report, and PET was normal.  4. Atrial fibrillation: Chronic. Following rate control/anticoagulation strategy.  - Continue Toprol XL. - Continue Xarelto 15 mg daily, no bleeding problems.  CBC today.  5. Orthostatic hypotension: She has orthostatic hypotension with supine hypertension. She was on pyridostigmine but says it caused hot flashes so she stopped it.  No evidence for cardiac amyloidosis.  - Continue to take midodrine 5 mg regularly about 2 hours after she wakes up as this should cover her most symptomatic period.  - Continue to wear compression stockings.  6. CKD: Stage 3. BMET today.   Followup in 4 months  Signed, [name], MD  08/14/2019  [facility] [address] [facility] [place]  [address] [phone] (office) [phone] (fax)

## 2019-10-12 ENCOUNTER — Other Ambulatory Visit (HOSPITAL_COMMUNITY): Payer: Self-pay | Admitting: Cardiology

## 2019-10-19 ENCOUNTER — Other Ambulatory Visit (HOSPITAL_COMMUNITY): Payer: Self-pay

## 2019-10-19 MED ORDER — UPTRAVI 1000 MCG PO TABS: 1.0000 | ORAL_TABLET | Freq: Two times a day (BID) | ORAL | 2 refills | Status: DC

## 2019-10-23 ENCOUNTER — Other Ambulatory Visit (HOSPITAL_COMMUNITY): Payer: Self-pay

## 2019-10-23 MED ORDER — UPTRAVI 1000 MCG PO TABS: 1.0000 | ORAL_TABLET | Freq: Two times a day (BID) | ORAL | 3 refills | Status: DC

## 2019-12-18 ENCOUNTER — Encounter (HOSPITAL_COMMUNITY): Payer: Medicare Other | Admitting: Cardiology

## 2020-02-12 ENCOUNTER — Telehealth (HOSPITAL_COMMUNITY): Payer: Self-pay | Admitting: Pharmacist

## 2020-02-12 MED ORDER — UPTRAVI 1000 MCG PO TABS: 1.0000 | ORAL_TABLET | Freq: Two times a day (BID) | ORAL | 11 refills | Status: DC

## 2020-02-12 NOTE — Telephone Encounter (Signed)
Cordie Grice prescription sent to [facility].  Karle Plumber, PharmD, BCPS, BCCP, CPP [facility] Clinic Pharmacist [phone]

## 2020-02-19 ENCOUNTER — Other Ambulatory Visit (HOSPITAL_COMMUNITY): Payer: Self-pay

## 2020-02-19 MED ORDER — UPTRAVI 1000 MCG PO TABS: 1.0000 | ORAL_TABLET | Freq: Two times a day (BID) | ORAL | 11 refills | Status: DC

## 2020-02-21 ENCOUNTER — Encounter (HOSPITAL_COMMUNITY): Payer: Self-pay | Admitting: Cardiology

## 2020-02-21 ENCOUNTER — Other Ambulatory Visit: Payer: Self-pay

## 2020-02-21 ENCOUNTER — Ambulatory Visit (HOSPITAL_COMMUNITY)
Admission: RE | Admit: 2020-02-21 | Discharge: 2020-02-21 | Disposition: A | Discharge status: Home / Self Care | Payer: Medicare Other | Source: Ambulatory Visit | Attending: Cardiology | Admitting: Cardiology

## 2020-02-21 VITALS — BP 180/90 | HR 70 | Wt 133.8 lb

## 2020-02-21 DIAGNOSIS — Z8616 Personal history of COVID-19: Secondary | ICD-10-CM | POA: Insufficient documentation

## 2020-02-21 DIAGNOSIS — I482 Chronic atrial fibrillation, unspecified: Secondary | ICD-10-CM | POA: Insufficient documentation

## 2020-02-21 DIAGNOSIS — J439 Emphysema, unspecified: Secondary | ICD-10-CM | POA: Insufficient documentation

## 2020-02-21 DIAGNOSIS — Z79899 Other long term (current) drug therapy: Secondary | ICD-10-CM | POA: Diagnosis not present

## 2020-02-21 DIAGNOSIS — I951 Orthostatic hypotension: Secondary | ICD-10-CM | POA: Insufficient documentation

## 2020-02-21 DIAGNOSIS — I5032 Chronic diastolic (congestive) heart failure: Secondary | ICD-10-CM | POA: Diagnosis present

## 2020-02-21 DIAGNOSIS — Z7901 Long term (current) use of anticoagulants: Secondary | ICD-10-CM | POA: Insufficient documentation

## 2020-02-21 DIAGNOSIS — Z853 Personal history of malignant neoplasm of breast: Secondary | ICD-10-CM | POA: Insufficient documentation

## 2020-02-21 DIAGNOSIS — I272 Pulmonary hypertension, unspecified: Secondary | ICD-10-CM | POA: Diagnosis not present

## 2020-02-21 DIAGNOSIS — I2721 Secondary pulmonary arterial hypertension: Secondary | ICD-10-CM | POA: Insufficient documentation

## 2020-02-21 DIAGNOSIS — N183 Chronic kidney disease, stage 3 unspecified: Secondary | ICD-10-CM | POA: Insufficient documentation

## 2020-02-21 DIAGNOSIS — I130 Hypertensive heart and chronic kidney disease with heart failure and stage 1 through stage 4 chronic kidney disease, or unspecified chronic kidney disease: Secondary | ICD-10-CM | POA: Insufficient documentation

## 2020-02-21 LAB — BASIC METABOLIC PANEL
Anion gap: 10 (ref 5–15)
BUN: 26 mg/dL — ABNORMAL HIGH (ref 8–23)
CO2: 27 mmol/L (ref 22–32)
Calcium: 9.5 mg/dL (ref 8.9–10.3)
Chloride: 101 mmol/L (ref 98–111)
Creatinine, Ser: 1.53 mg/dL — ABNORMAL HIGH (ref 0.44–1.00)
GFR, Estimated: 33 mL/min — ABNORMAL LOW (ref >60)
Glucose, Bld: 88 mg/dL (ref 70–99)
Potassium: 3.9 mmol/L (ref 3.5–5.1)
Sodium: 138 mmol/L (ref 135–145)

## 2020-02-21 LAB — BRAIN NATRIURETIC PEPTIDE: B Natriuretic Peptide: 308.2 pg/mL — ABNORMAL HIGH (ref 0.0–100.0)

## 2020-02-21 LAB — CBC
HCT: 39.9 % (ref 36.0–46.0)
Hemoglobin: 12.6 g/dL (ref 12.0–15.0)
MCH: 29.7 pg (ref 26.0–34.0)
MCHC: 31.6 g/dL (ref 30.0–36.0)
MCV: 94.1 fL (ref 80.0–100.0)
Platelets: 199 10*3/uL (ref 150–400)
RBC: 4.24 MIL/uL (ref 3.87–5.11)
RDW: 14.6 % (ref 11.5–15.5)
WBC: 6.3 10*3/uL (ref 4.0–10.5)
nRBC: 0 % (ref 0.0–0.2)

## 2020-02-21 MED ORDER — METOPROLOL SUCCINATE ER 25 MG PO TB24: 25.0000 mg | ORAL_TABLET | Freq: Every day | ORAL | 3 refills | Status: DC

## 2020-02-21 NOTE — Progress Notes (Signed)
Date:  02/21/2020   ID:  Rachael ParishBetty H Derrig, DOB Jan 12, 1929, MRN [id number]  Provider location: [place] [facility] Type of Visit: Established patient  PCP:  Dan Makerrr, Richard L., MD  Cardiologist:  Dr. Shirlee LatchMcLean   History of Present Illness: Rachael Myers is a 87 y.o. female who has a history of chronic diastolic CHF, chronic atrial fibrillation, and prominent exertional dyspnea.  She has had atrial fibrillation long-term and has failed Multaq and propafenone use.  She is in atrial fibrillation chronically now.  In 1/16, she developed PNA.  She had a parapneumonic effusion that required 4 thoracenteses.  Eventually, she had VATS with decortication and a wedge resection.  Cytology was negative from the pleural fluid.  [facility] and showed no PE, mild interstitial edema.  Last echo was done in 11/16, showing normal LV EF and apparently normal RV EF.  PA systolic pressure estimation was moderate to severely increased.  I took her for RHC in 12/16, this showed moderate pulmonary arterial hypertension.  PFTs showed a restrictive pattern concerning for interstitial lung disease.  V/Q scan in 12/16 was not suggestive of chronic PE.  Despite abnormal PFTs, high resolution CT did not show interstitial lung disease.    At a prior appointment, started her on Opsumit. She developed a cough and mouth soreness with this medication and had to stop it after about 7 days. Symptoms resolved when she stopped it. Tried to get Adcirca for her. This was denied by her insurance company, so she was started on Revatio 20 mg tid instead. She thinks that this has helped her breathing "a little." She saw a pulmonary specialist who [facility]. Next, tried her on ambrisentan, but she developed facial swelling on ambrisentan (significant around the eyes). She also developed worsening dyspnea. She stopped ambrisentan and the swelling resolved but she was  noted to be volume overloaded. Switched her Lasix to torsemide.  She was started on selexipag which she has tolerated.   She had a difficult winter with episode of PNA in 2/20.  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 There was moderate emphysema though she never smoked.  She says that [facility] up on her CT with a PET scan that was normal.   She had a COVID-19 infection in 12/20 but appears to have recovered well.   Echo in 5/21 showed EF 60-65%, mild LVH, PASP 33 mmHg, normal RV, mild MR, mild-moderate AS with mean gradient 14 mmHg, AVA 1.23 cm^2.  PYP scan in 5/21 was not suggestive of TTR amyloidosis.   [name] and had mastectomy (no chemo or radiation).   She returns today followup of pulmonary hypertension and orthostatic hypotension.  Her husband died at 5590 in December.  She now lives alone.  She continues to have difficulty with orthostatic hypotension and supine hypertension.  She says that her BP is high when she gets up, drops to SBP 80s-90s in the later morning, then rises again in the evening.  Taking midodrine once in the mid-morning helps prevent the significant BP drops.  SBP up to 180 in the morning when she first gets up. No falls. Weight is up 4 lbs.  No dyspnea walking on flat ground.  No chest pain.    Labs (9/16): K  4, creatinine 1.31, BNP 466 Labs (11/16): BNP 577 Labs (12/16); K 4, creatinine 1.3, normal rheumatoid factor and scleroderma serologies Labs (1/17): ANA positive but only 1:80 titer, dsDNA negative, SSA/B negative, CCP negative.  Labs (2/17): K 4.1, creatinine 1.48 => 1.42, BNP 487 Labs (3/17): K 4.2, creatinine 1.58, BNP 326 Labs (7/17): K 3.3, creatinine 1.7, hgb 10.7 Labs (8/17): K 3.9, creatinine 1.87, HCT 35.6 Labs (1/18): hgb 9.9, creatinine 1.42 Labs (4/18): TSH normal, hgb  10.5, K 3.8, creatinine 1.34 Labs (6/18): BNP 415, K 4.9, creatinine 1.45 Labs (1/19): K 4.4, creatinine 1.56, hgb 11.3 Labs (7/19): K 4, creatinine 1.5, BNP 381 Labs (1/20): K 3.8, creatinine 1.38 Labs (11/20): K 3.8, creatinine 1.59 Labs (5/21): K 3.8, creatinine 1.4, myeloma panel negative, urine immunofixation negative Labs (8/21): BNP 319, hgb 10.2, K 4.4, creatinine 1.7  ECG (personally reviewed): atrial fibrillation, LAFB, LVH  PMH: 1. Cardiolite (9/16) with EF 54%, no ischemia/infarction. 2. Chronic diastolic CHF: Echo (11/16) with EF 55-60%, normal RV size and systolic function, PA systolic pressure 66 mmHg.  - Echo (7/17) with EF 60-65%, mildly dilated RV with mildly decreased systolic function, PA systolic pressure 36 mmHg.  - Echo (7/18) with EF 55-60%, mild to moderate AI, normal RV size and systolic function, PASP 43 mmHg.  - Echo (1/20) with EF 55-60%, normal RV size and systolic function, mild AI, mild MR.  - Echo (5/21) witih EF 60-65%, mild LVH, PASP 33 mmHg, normal RV, mild MR, mild-moderate AS with mean gradient 14 mmHg, AVA 1.23 cm^2.  - PYP scan (5/21): grade 0, H/CL 1.1 (not suggestive of TTR amyloidosis).  3. Chronic atrial fibrillation: She has been intolerant of Multaq and propafenone.  Sotalol and flecainide have not been used due to CKD.   4. SBO: Managed conservatively. 5. CKD 6. OA: h/o THR and TKR.  7. H/o hyponatremia 8. Pneumonia with parapneumonic effusion with thoracenteses and eventual VATS and decortication.  9. Pulmonary hypertension: Suspect Group 1 pulmonary hypertension.  RHC (12/16) with mean RA 5, PA 62/22 mean 40, mean PCWP 19, CI 2.46, PVR 5 WU.  PFTs (12/16) wiith FVC 71%, FEV1 73%, ratio 104%, TLC 71%, DLCO 53% => moderate restriction concerning for interstitial lung disease.  High resolution CT (12/16) did not show evidence for interstitial lung disease.  V/Q scan (12/16) did not show evidence for chronic PE.  Serologies: RF negative,  scleroderma antibodies negative, ANA positive but only 1:80 titer, SSA/B negative, dsDNA negative, CCP negative.  She did not tolerate macitentan or ambrisentan.  Echo 7/17 with PA systolic pressure down to 36 mmHg.   - 6 minute walk (12/16) with 244 meters - 6 minute walk (2/17) with 232 meters - 6 minute walk (3/17) with 414 meters - 6 minute walk (4/17) with 267 meters - 6 minute walk (8/17) with 241 meters - 6 minute walk (10/17) with 256 meters - 6 minute walk (6/18) with 305 meters - 6 minute walk (9/18) with 320 meters - 6 minute walk (1/19) with 213 meters - 6 minute walk (7/19) with 335 meters - 6 minute walk (1/20) with 213 meters - 6 minute walk (10/20) with 243 meters - 6 minute walk (2/21) with 243 meters - 6 minute walk (8/21) with 214 meters - 6 minute walk (2/22) with 260 meters 10. Lightheadedness/syncope: Extensive workup.  Event and holter monitors as well as telemetry monitoring negative.  She has not been orthostatic.  - Did not tolerate pyridostigmine due to hot flashes.  11. Sciatica: Status post back surgery 1/18.  12. COVID-19 infection 12/20.  13. Peripheral neuropathy 14. Aortic stenosis: Mild to moderate on 5/21 echo.  15. Breast cancer: Triple negative on left, s/p mastectomy in 7/21.    Current Outpatient Medications  Medication Sig Dispense Refill  . amoxicillin (AMOXIL) 500 MG capsule TAKE 4 CAPSULES BY MOUTH ONE HOUR PRIOR TO DENTAL APPOINTMENT    . gabapentin (NEURONTIN) 300 MG capsule Take 300 mg by mouth daily.    Marland Kitchen ipratropium-albuterol (DUONEB) 0.5-2.5 (3) MG/3ML SOLN USE 1 VIAL IN NEBULIZER EVERY 6 HOURS - And As Needed    . midodrine (PROAMATINE) 5 MG tablet Take 5 mg by mouth as needed.    . potassium chloride (KLOR-CON) 10 MEQ tablet Take by mouth.    . Rivaroxaban (XARELTO) 15 MG TABS tablet Take 15 mg by mouth daily with supper.     . sildenafil (REVATIO) 20 MG tablet Take 4 tablets (80 mg total) by mouth 3 (three) times daily. 360 tablet  11  . torsemide (DEMADEX) 20 MG tablet Take 1 tablet (20 mg total) by mouth daily. 90 tablet 3  . UPTRAVI 1000 MCG TABS Take 1 tablet by mouth 2 (two) times daily. 60 tablet 11  . metoprolol succinate (TOPROL-XL) 25 MG 24 hr tablet Take 1 tablet (25 mg total) by mouth at bedtime. 90 tablet 3   No current facility-administered medications for this encounter.    Allergies:   Acetaminophen, Hydrocodone-acetaminophen, Letairis [ambrisentan], Opsumit [macitentan], Ace inhibitors, Atenolol, Hctz [hydrochlorothiazide], Oxycodone, and Diltiazem hcl   Social History:  The patient  reports that she has never smoked. She has never used smokeless tobacco. She reports that she does not drink alcohol and does not use drugs.   Family History:  The patient's family history includes Cancer in her mother.   ROS:  Please see the history of present illness.   All other systems are personally reviewed and negative.   Exam:   BP (!) 180/90   Pulse 70   Wt 60.7 kg (133 lb 12.8 oz)   SpO2 98%   BMI 20.96 kg/m  General: NAD Neck: No JVD, no thyromegaly or thyroid nodule.  Lungs: Clear to auscultation bilaterally with normal respiratory effort. CV: Nondisplaced PMI.  Heart irregular S1/S2, no S3/S4, no murmur.  No peripheral edema.  No carotid bruit.  Normal pedal pulses.  Abdomen: Soft, nontender, no hepatosplenomegaly, no distention.  Skin: Intact without lesions or rashes.  Neurologic: Alert and oriented x 3.  Psych: Normal affect. Extremities: No clubbing or cyanosis.  HEENT: Normal.   Recent Labs: 02/21/2020: B Natriuretic Peptide 308.2; BUN 26; Creatinine, Ser 1.53; Hemoglobin 12.6; Platelets 199; Potassium 3.9; Sodium 138  Personally reviewed   Wt Readings from Last 3 Encounters:  02/21/20 60.7 kg (133 lb 12.8 oz)  08/13/19 58.9 kg (129 lb 12.8 oz)  05/08/19 59.1 kg (130 lb 6.4 oz)     ASSESSMENT AND PLAN: 1. Chronic diastolic CHF: NYHA class II symptoms (stable).  Not volume overloaded on  exam.    -Continue torsemide 20 mg daily. BMET/BNP today.     2. Pulmonary hypertension: Moderate to severe pulmonary hypertension by echo (PASP 66 mmHg). RHC showed moderate pulmonary arterial hypertension with PVR 5 WU. PFTs with restrictive changes suggestive of interstitial process but high resolution CT did not show interstitial lung disease. No evidence for chronic PE on V/Q scan. Rheumatologic serologies all negative. Possible group 1 pulmonary hypertension. She was unable to tolerate macitentan  or ambrisentan. 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 She is now on sildenafil 80 mg tid and selexipag 1000 mcg bid, unable to increase selexipag further due to symptoms. Echo in 5/21 showed preserved RV systolic function and PASP 33 mmHg. 6 minute walk today mildly better than prior.   - Continue Selexipag at 1000 mcg bid, unable to tolerate uptitration of Selexipag any further.  - Continue sildenafil 80 mg three times a day.  - Check BNP today.  - Echo at followup in 3 months.  3. Pulmonary: s/p PNA with parapneumonic effusion and eventual VATS with decortication. As above, restrictive PFTs but no ILD on high resolution CT.  Most recent CT in 4/20 showed a concerning RLL nodule and emphysema, though she never smoked. Her pulmonologist ordered a PET scan after this per her report, and PET was normal.  4. Atrial fibrillation: Chronic. Following rate control/anticoagulation strategy.  - Continue Toprol XL. - Continue Xarelto 15 mg daily, no bleeding problems.  CBC today.  5. Orthostatic hypotension: She has orthostatic hypotension with supine hypertension. She was on pyridostigmine but says it caused hot flashes so she stopped it.  No evidence for cardiac amyloidosis.  - Continue to take midodrine 5 mg regularly about 2 hours after she wakes up as this should cover her most symptomatic period.  - Continue to wear compression stockings.  - I will increase Toprol XL to 25 mg at bedtime given  very high BP when she first gets up in the morning.  6. CKD: Stage 3. BMET today.   Followup in 3 months with echo.   Signed, [name], MD  02/21/2020  [facility] [place] [address] [facility] [place] [address] [phone] (office) [phone] (fax)

## 2020-02-21 NOTE — Progress Notes (Signed)
6 Min Walk Test Completed  Pt ambulated 2590.8 meters O2 Sat ranged 94%-98% on room air HR ranged 68-85

## 2020-02-21 NOTE — Patient Instructions (Signed)
6 minute walk done today.  Labs done today. We will contact you only if your labs are abnormal.  INCREASE Metoprolol to 25mg  (1 tablet) by mouth daily at bedtime.  No other medication changes were made. Please continue all current medications as prescribed.  Your physician recommends that you schedule a follow-up appointment in: 3 months with an echo prior to your appointment.  Your physician has requested that you have an echocardiogram. Echocardiography is a painless test that uses sound waves to create images of your heart. It provides your doctor with information about the size and shape of your heart and how well your heart's chambers and valves are working. This procedure takes approximately one hour. There are no restrictions for this procedure.   If you have any questions or concerns before your next appointment please send a message through [place] or call our office at [phone].    TO LEAVE A MESSAGE FOR THE NURSE SELECT OPTION 2, PLEASE LEAVE A MESSAGE INCLUDING: . YOUR NAME . DATE OF BIRTH . CALL BACK NUMBER . REASON FOR CALL**this is important as we prioritize the call backs  YOU WILL RECEIVE A CALL BACK THE SAME DAY AS LONG AS YOU CALL BEFORE 4:00 PM   Do the following things EVERYDAY: 1) Weigh yourself in the morning before breakfast. Write it down and keep it in a log. 2) Take your medicines as prescribed 3) Eat low salt foods--Limit salt (sodium) to 2000 mg per day.  4) Stay as active as you can everyday 5) Limit all fluids for the day to less than 2 liters   At the [facility], you and your health needs are our priority. As part of our continuing mission to provide you with exceptional heart care, we have created designated Provider Care Teams. These Care Teams include your primary Cardiologist (physician) and Advanced Practice Providers (APPs- Physician Assistants and Nurse Practitioners) who all work together to provide you with the care  you need, when you need it.   You may see any of the following providers on your designated Care Team at your next follow up: [phone] Dr Marland Kitchen . Dr Arvilla Meres . [name], NP . Tonye Becket, PA . Robbie Lis, PharmD   Please be sure to bring in all your medications bottles to every appointment.

## 2020-03-04 ENCOUNTER — Telehealth (HOSPITAL_COMMUNITY): Payer: Self-pay | Admitting: Cardiology

## 2020-03-04 MED ORDER — UPTRAVI 1000 MCG PO TABS: 1.0000 | ORAL_TABLET | Freq: Two times a day (BID) | ORAL | 3 refills | Status: DC

## 2020-03-04 MED ORDER — METOPROLOL SUCCINATE ER 25 MG PO TB24: 25.0000 mg | ORAL_TABLET | Freq: Every day | ORAL | 3 refills | Status: DC

## 2020-03-04 MED ORDER — RIVAROXABAN 15 MG PO TABS: 15.0000 mg | ORAL_TABLET | Freq: Every day | ORAL | 3 refills | Status: DC

## 2020-03-04 MED ORDER — SILDENAFIL CITRATE 20 MG PO TABS: 80.0000 mg | ORAL_TABLET | Freq: Three times a day (TID) | ORAL | 11 refills | Status: DC

## 2020-03-04 NOTE — Telephone Encounter (Signed)
Pt request 90 day supply for Gabapentine, Nat Math, uptravi, sildenafil, metoprolol succinate, [facility] to [facility], pt is running out of meds, she can be reached @[phone] . Thanks

## 2020-04-28 ENCOUNTER — Telehealth (HOSPITAL_COMMUNITY): Payer: Self-pay | Admitting: Pharmacist

## 2020-04-28 NOTE — Telephone Encounter (Signed)
Patient Advocate Encounter   Received notification from [facility] that prior authorization for Rachael Myers is required.   PA submitted on CoverMyMeds Key [id number] Status is pending   Will continue to follow.   Karle Plumber, PharmD, BCPS, BCCP, CPP [facility] Pharmacist [phone]

## 2020-04-29 NOTE — Telephone Encounter (Signed)
[facility] Patient Advocate Encounter  Prior Authorization for Rachael Myers has been approved.    PA# [id number] Effective dates: 03/29/2020 - 01/03/2098  Karle Plumber, PharmD, BCPS, BCCP, CPP [facility] Clinic Pharmacist [phone]

## 2020-05-02 ENCOUNTER — Other Ambulatory Visit (HOSPITAL_COMMUNITY): Payer: Self-pay | Admitting: Cardiology

## 2020-05-27 ENCOUNTER — Ambulatory Visit (HOSPITAL_BASED_OUTPATIENT_CLINIC_OR_DEPARTMENT_OTHER)
Admission: RE | Admit: 2020-05-27 | Discharge: 2020-05-27 | Disposition: A | Discharge status: Home / Self Care | Payer: Medicare Other | Source: Ambulatory Visit | Attending: Cardiology | Admitting: Cardiology

## 2020-05-27 ENCOUNTER — Encounter (HOSPITAL_COMMUNITY): Payer: Self-pay | Admitting: Cardiology

## 2020-05-27 ENCOUNTER — Telehealth: Payer: Self-pay | Admitting: *Deleted

## 2020-05-27 ENCOUNTER — Other Ambulatory Visit: Payer: Self-pay

## 2020-05-27 ENCOUNTER — Ambulatory Visit (HOSPITAL_COMMUNITY)
Admission: RE | Admit: 2020-05-27 | Discharge: 2020-05-27 | Disposition: A | Discharge status: Home / Self Care | Payer: Medicare Other | Source: Ambulatory Visit | Attending: Cardiology | Admitting: Cardiology

## 2020-05-27 VITALS — BP 200/90 | HR 73 | Wt 134.4 lb

## 2020-05-27 DIAGNOSIS — I10 Essential (primary) hypertension: Secondary | ICD-10-CM

## 2020-05-27 DIAGNOSIS — I272 Pulmonary hypertension, unspecified: Secondary | ICD-10-CM | POA: Diagnosis not present

## 2020-05-27 DIAGNOSIS — Z7901 Long term (current) use of anticoagulants: Secondary | ICD-10-CM | POA: Diagnosis not present

## 2020-05-27 DIAGNOSIS — I350 Nonrheumatic aortic (valve) stenosis: Secondary | ICD-10-CM | POA: Diagnosis not present

## 2020-05-27 DIAGNOSIS — G4489 Other headache syndrome: Secondary | ICD-10-CM

## 2020-05-27 DIAGNOSIS — R030 Elevated blood-pressure reading, without diagnosis of hypertension: Secondary | ICD-10-CM

## 2020-05-27 DIAGNOSIS — J439 Emphysema, unspecified: Secondary | ICD-10-CM | POA: Diagnosis not present

## 2020-05-27 DIAGNOSIS — N183 Chronic kidney disease, stage 3 unspecified: Secondary | ICD-10-CM | POA: Insufficient documentation

## 2020-05-27 DIAGNOSIS — I482 Chronic atrial fibrillation, unspecified: Secondary | ICD-10-CM | POA: Insufficient documentation

## 2020-05-27 DIAGNOSIS — Z853 Personal history of malignant neoplasm of breast: Secondary | ICD-10-CM | POA: Insufficient documentation

## 2020-05-27 DIAGNOSIS — R55 Syncope and collapse: Secondary | ICD-10-CM

## 2020-05-27 DIAGNOSIS — I2721 Secondary pulmonary arterial hypertension: Secondary | ICD-10-CM | POA: Diagnosis not present

## 2020-05-27 DIAGNOSIS — Z79899 Other long term (current) drug therapy: Secondary | ICD-10-CM | POA: Insufficient documentation

## 2020-05-27 DIAGNOSIS — I130 Hypertensive heart and chronic kidney disease with heart failure and stage 1 through stage 4 chronic kidney disease, or unspecified chronic kidney disease: Secondary | ICD-10-CM | POA: Diagnosis not present

## 2020-05-27 DIAGNOSIS — Z8616 Personal history of COVID-19: Secondary | ICD-10-CM | POA: Insufficient documentation

## 2020-05-27 DIAGNOSIS — I998 Other disorder of circulatory system: Secondary | ICD-10-CM

## 2020-05-27 DIAGNOSIS — N189 Chronic kidney disease, unspecified: Secondary | ICD-10-CM | POA: Diagnosis not present

## 2020-05-27 DIAGNOSIS — I5032 Chronic diastolic (congestive) heart failure: Secondary | ICD-10-CM | POA: Insufficient documentation

## 2020-05-27 LAB — ECHOCARDIOGRAM COMPLETE
AR max vel: 1.4 cm2
AV Area VTI: 1.45 cm2
AV Area mean vel: 1.38 cm2
AV Mean grad: 10 mmHg
AV Peak grad: 15.4 mmHg
Ao pk vel: 1.96 m/s
Area-P 1/2: 4.57 cm2
Calc EF: 58.8 %
MV M vel: 5.77 m/s
MV Peak grad: 133.2 mmHg
P 1/2 time: 674 msec
Radius: 0.7 cm
S' Lateral: 2.8 cm
Single Plane A2C EF: 61.6 %
Single Plane A4C EF: 55.3 %

## 2020-05-27 NOTE — Progress Notes (Signed)
Date:  05/27/2020   ID:  Rachael Myers, DOB 1929/11/21, MRN [id number]  Provider location: [place] [facility] Type of Visit: Established patient  PCP:  Dan Maker., MD  Cardiologist:  Dr. Shirlee Latch   History of Present Illness: Rachael Myers is a 87 y.o. female who has a history of chronic diastolic CHF, chronic atrial fibrillation, and prominent exertional dyspnea.  She has had atrial fibrillation long-term and has failed Multaq and propafenone use.  She is in atrial fibrillation chronically now.  In 1/16, she developed PNA.  She had a parapneumonic effusion that required 4 thoracenteses.  Eventually, she had VATS with decortication and a wedge resection.  Cytology was negative from the pleural fluid.  Last chest CT was at [facility] in 8/16 and showed no PE, mild interstitial edema.  Last echo was done in 11/16, showing normal LV EF and apparently normal RV EF.  PA systolic pressure estimation was moderate to severely increased.  I took her for RHC in 12/16, this showed moderate pulmonary arterial hypertension.  PFTs showed a restrictive pattern concerning for interstitial lung disease.  V/Q scan in 12/16 was not suggestive of chronic PE.  Despite abnormal PFTs, high resolution CT did not show interstitial lung disease.    At a prior appointment, started her on Opsumit. She developed a cough and mouth soreness with this medication and had to stop it after about 7 days. Symptoms resolved when she stopped it. Tried to get Adcirca for her. This was denied by her insurance company, so she was started on Revatio 20 mg tid instead. She thinks that this has helped her breathing "a little." She saw a pulmonary specialist who thought dyspnea was likely due to [facility]. Next, tried her on ambrisentan, but she developed facial swelling on ambrisentan (significant around the eyes). She also developed worsening dyspnea. She stopped ambrisentan and the swelling resolved but she was  noted to be volume overloaded. Switched her Lasix to torsemide.  She was started on selexipag which she has tolerated.   She had a difficult winter with episode of PNA in 2/20.  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 There was moderate emphysema though she never smoked.  She says that her pulmonologist in [facility] followed up on her CT with a PET scan that was normal.   She had a COVID-19 infection in 12/20 but appears to have recovered well.   Echo in 5/21 showed EF 60-65%, mild LVH, PASP 33 mmHg, normal RV, mild MR, mild-moderate AS with mean gradient 14 mmHg, AVA 1.23 cm^2.  PYP scan in 5/21 was not suggestive of TTR amyloidosis.   [name] and had mastectomy (no chemo or radiation).   Echo was done today and reviewed, EF 55-60%, mildly decreased RV systolic function, PASP 47, mild MR, mild AS mean gradient 10 mmHg.   She returns today followup of pulmonary hypertension and orthostatic hypotension.  BP still has marked fluctuations.  She brings readings with her, up to 180-190 systolic in the earlier morning, drops to 90s-110s by late morning/noon.  Taking midodrine once in the mid-morning helps prevent the significant BP drops.  She takes Toprol XL at night to try to help with elevated BP in the early mornings.  No falls, but she can feel some lightheadedness when her BP drops around noon.  No significant exertional  dyspnea or chest pain. No palpitations.     Labs (9/16): K 4, creatinine 1.31, BNP 466 Labs (11/16): BNP 577 Labs (12/16); K 4, creatinine 1.3, normal rheumatoid factor and scleroderma serologies Labs (1/17): ANA positive but only 1:80 titer, dsDNA negative, SSA/B negative, CCP negative.  Labs (2/17): K 4.1, creatinine 1.48 => 1.42, BNP 487 Labs (3/17): K 4.2, creatinine 1.58, BNP 326 Labs (7/17): K 3.3, creatinine 1.7, hgb  10.7 Labs (8/17): K 3.9, creatinine 1.87, HCT 35.6 Labs (1/18): hgb 9.9, creatinine 1.42 Labs (4/18): TSH normal, hgb 10.5, K 3.8, creatinine 1.34 Labs (6/18): BNP 415, K 4.9, creatinine 1.45 Labs (1/19): K 4.4, creatinine 1.56, hgb 11.3 Labs (7/19): K 4, creatinine 1.5, BNP 381 Labs (1/20): K 3.8, creatinine 1.38 Labs (11/20): K 3.8, creatinine 1.59 Labs (5/21): K 3.8, creatinine 1.4, myeloma panel negative, urine immunofixation negative Labs (8/21): BNP 319, hgb 10.2, K 4.4, creatinine 1.7 Labs (5/22): LDL 87, TSH normal, hgb 12.6, K 3.9, creatinine 1.26  PMH: 1. Cardiolite (9/16) with EF 54%, no ischemia/infarction. 2. Chronic diastolic CHF: Echo (11/16) with EF 55-60%, normal RV size and systolic function, PA systolic pressure 66 mmHg.  - Echo (7/17) with EF 60-65%, mildly dilated RV with mildly decreased systolic function, PA systolic pressure 36 mmHg.  - Echo (7/18) with EF 55-60%, mild to moderate AI, normal RV size and systolic function, PASP 43 mmHg.  - Echo (1/20) with EF 55-60%, normal RV size and systolic function, mild AI, mild MR.  - Echo (5/21) witih EF 60-65%, mild LVH, PASP 33 mmHg, normal RV, mild MR, mild-moderate AS with mean gradient 14 mmHg, AVA 1.23 cm^2.  - PYP scan (5/21): grade 0, H/CL 1.1 (not suggestive of TTR amyloidosis).  - Echo (5/22): EF 55-60%, mildly decreased RV systolic function, PASP 47, mild MR, mild AS mean gradient 10 mmHg. 3. Chronic atrial fibrillation: She has been intolerant of Multaq and propafenone.  Sotalol and flecainide have not been used due to CKD.   4. SBO: Managed conservatively. 5. CKD 6. OA: h/o THR and TKR.  7. H/o hyponatremia 8. Pneumonia with parapneumonic effusion with thoracenteses and eventual VATS and decortication.  9. Pulmonary hypertension: Suspect Group 1 pulmonary hypertension.  RHC (12/16) with mean RA 5, PA 62/22 mean 40, mean PCWP 19, CI 2.46, PVR 5 WU.  PFTs (12/16) wiith FVC 71%, FEV1 73%, ratio 104%, TLC 71%, DLCO  53% => moderate restriction concerning for interstitial lung disease.  High resolution CT (12/16) did not show evidence for interstitial lung disease.  V/Q scan (12/16) did not show evidence for chronic PE.  Serologies: RF negative, scleroderma antibodies negative, ANA positive but only 1:80 titer, SSA/B negative, dsDNA negative, CCP negative.  She did not tolerate macitentan or ambrisentan.  Echo 7/17 with PA systolic pressure down to 36 mmHg.   - 6 minute walk (12/16) with 244 meters - 6 minute walk (2/17) with 232 meters - 6 minute walk (3/17) with 414 meters - 6 minute walk (4/17) with 267 meters - 6 minute walk (8/17) with 241 meters - 6 minute walk (10/17) with 256 meters - 6 minute walk (6/18) with 305 meters - 6 minute walk (9/18) with 320 meters - 6 minute walk (1/19) with 213 meters - 6 minute walk (7/19) with 335 meters - 6 minute walk (1/20) with 213 meters - 6 minute walk (10/20) with 243 meters - 6 minute walk (2/21) with 243 meters - 6 minute walk (8/21) with 214 meters - 6  minute walk (2/22) with 260 meters 10. Lightheadedness/syncope: Extensive workup.  Event and holter monitors as well as telemetry monitoring negative.  She has not been orthostatic.  - Did not tolerate pyridostigmine due to hot flashes.  11. Sciatica: Status post back surgery 1/18.  12. COVID-19 infection 12/20.  13. Peripheral neuropathy 14. Aortic stenosis: Mild to moderate on 5/21 echo.  15. Breast cancer: Triple negative on left, s/p mastectomy in 7/21.    Current Outpatient Medications  Medication Sig Dispense Refill  . amoxicillin (AMOXIL) 500 MG capsule TAKE 4 CAPSULES BY MOUTH ONE HOUR PRIOR TO DENTAL APPOINTMENT    . gabapentin (NEURONTIN) 300 MG capsule Take 300 mg by mouth daily.    Marland Kitchen. ipratropium-albuterol (DUONEB) 0.5-2.5 (3) MG/3ML SOLN USE 1 VIAL IN NEBULIZER EVERY 6 HOURS - And As Needed    . metoprolol succinate (TOPROL-XL) 25 MG 24 hr tablet Take 1 tablet (25 mg total) by mouth at  bedtime. 90 tablet 3  . midodrine (PROAMATINE) 5 MG tablet Take 5 mg by mouth as needed.    . potassium chloride (KLOR-CON) 10 MEQ tablet Take by mouth.    . Rivaroxaban (XARELTO) 15 MG TABS tablet Take 1 tablet (15 mg total) by mouth daily with supper. 90 tablet 3  . sildenafil (REVATIO) 20 MG tablet Take 4 tablets (80 mg total) by mouth 3 (three) times daily. 360 tablet 11  . torsemide (DEMADEX) 20 MG tablet Take 20 mg by mouth daily.    Marland Kitchen. UPTRAVI 1000 MCG TABS Take 1 tablet by mouth 2 (two) times daily. 180 tablet 3   No current facility-administered medications for this encounter.    Allergies:   Acetaminophen, Hydrocodone-acetaminophen, Letairis [ambrisentan], Opsumit [macitentan], Ace inhibitors, Atenolol, Hctz [hydrochlorothiazide], Oxycodone, and Diltiazem hcl   Social History:  The patient  reports that she has never smoked. She has never used smokeless tobacco. She reports that she does not drink alcohol and does not use drugs.   Family History:  The patient's family history includes Cancer in her mother.   ROS:  Please see the history of present illness.   All other systems are personally reviewed and negative.   Exam:   BP (!) 200/90   Pulse 73   Wt 61 kg (134 lb 6.4 oz)   SpO2 98%   BMI 21.05 kg/m  General: NAD Neck: JVP 8-9 cm, no thyromegaly or thyroid nodule.  Lungs: Clear to auscultation bilaterally with normal respiratory effort. CV: Nondisplaced PMI.  Heart irregular S1/S2, no S3/S4, 2/6 SEM RUSB.  Trace ankle edema.  No carotid bruit.  Normal pedal pulses.  Abdomen: Soft, nontender, no hepatosplenomegaly, no distention.  Skin: Intact without lesions or rashes.  Neurologic: Alert and oriented x 3.  Psych: Normal affect. Extremities: No clubbing or cyanosis.  HEENT: Normal.   Recent Labs: 02/21/2020: B Natriuretic Peptide 308.2; BUN 26; Creatinine, Ser 1.53; Hemoglobin 12.6; Platelets 199; Potassium 3.9; Sodium 138  Personally reviewed   Wt Readings from Last  3 Encounters:  05/27/20 61 kg (134 lb 6.4 oz)  02/21/20 60.7 kg (133 lb 12.8 oz)  08/13/19 58.9 kg (129 lb 12.8 oz)     ASSESSMENT AND PLAN: 1. Chronic diastolic CHF: NYHA class II symptoms (stable).  Probably mild volume overload on exam.   -Continue torsemide 20 mg daily, I will not increase given marked BP fluctuations.     2. Pulmonary hypertension: Moderate to severe pulmonary hypertension by echo (PASP 66 mmHg). RHC showed moderate pulmonary arterial hypertension  with PVR 5 WU. PFTs with restrictive changes suggestive of interstitial process but high resolution CT did not show interstitial lung disease. No evidence for chronic PE on V/Q scan. Rheumatologic serologies all negative. Possible group 1 pulmonary hypertension. She was unable to tolerate macitentan or ambrisentan. 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 She is now on sildenafil 80 mg tid and selexipag 1000 mcg bid, unable to increase selexipag further due to symptoms. Echo today showed mildly decreased RV systolic function and PASP 47 mmHg.  - Continue Selexipag at 1000 mcg bid, unable to tolerate uptitration of Selexipag any further.  - Continue sildenafil 80 mg three times a day.  - Check BNP today.  - 6 minute walk at followup.  3. Pulmonary: s/p PNA with parapneumonic effusion and eventual VATS with decortication. As above, restrictive PFTs but no ILD on high resolution CT.  Most recent CT in 4/20 showed a concerning RLL nodule and emphysema, though she never smoked. Her pulmonologist ordered a PET scan after this per her report, and PET was normal.  4. Atrial fibrillation: Chronic. Following rate control/anticoagulation strategy.  - Continue Toprol XL. - Continue Xarelto 15 mg daily, no bleeding problems.  Recent CBC stable.  5. HTN: Marked BP fluctuations between the early morning and mid-day.  I am worried that she is a fall risk when her BP drops.  - Continue to take midodrine 5 mg regularly about 2 hours after  she wakes up as this should cover her most symptomatic period.  - Continue to wear compression stockings.  - Continue Toprol XL to 25 mg at bedtime given very high BP when she first gets up in the morning.  - I would like better documentation of her home BP fluctuations.  I will see if I can have her wear a 24 hour continuous BP monitor.  6. CKD: Stage 3. Recent BMET stable.  7. Aortic stenosis: Mild on today's echo.   Followup in 2 months.   Signed, [name], MD  05/27/2020  [facility] [address] [facility] [place] [address] [phone] (office) [phone] (fax)

## 2020-05-27 NOTE — Progress Notes (Signed)
  Echocardiogram 2D Echocardiogram has been performed.  Rachael Myers 05/27/2020, 12:01 PM

## 2020-05-27 NOTE — Patient Instructions (Addendum)
No Labs done today.   No medication changes were made. Please continue all current medications as prescribed.  Your physician has requested that you get a 24 hour blood pressure montior and also regularly monitor and record your blood pressure readings at home. Please use the same machine at the same time of day to check your readings and record them to bring to your follow-up visit. We will contact you at a later time with instructions.   Your physician recommends that you schedule a follow-up appointment in: 2 months  If you have any questions or concerns before your next appointment please send [place] a message through [place] or call our office at [phone].    TO LEAVE A MESSAGE FOR THE NURSE SELECT OPTION 2, PLEASE LEAVE A MESSAGE INCLUDING: . YOUR NAME . DATE OF BIRTH . CALL BACK NUMBER . REASON FOR CALL**this is important as we prioritize the call backs  YOU WILL RECEIVE A CALL BACK THE SAME DAY AS LONG AS YOU CALL BEFORE 4:00 PM   Do the following things EVERYDAY: 1) Weigh yourself in the morning before breakfast. Write it down and keep it in a log. 2) Take your medicines as prescribed 3) Eat low salt foods--Limit salt (sodium) to 2000 mg per day.  4) Stay as active as you can everyday 5) Limit all fluids for the day to less than 2 liters   At the [facility], you and your health needs are our priority. As part of our continuing mission to provide you with exceptional heart care, we have created designated Provider Care Teams. These Care Teams include your primary Cardiologist (physician) and Advanced Practice Providers (APPs- Physician Assistants and Nurse Practitioners) who all work together to provide you with the care you need, when you need it.   You may see any of the following providers on your designated Care Team at your next follow up: Marland Kitchen Dr Arvilla Meres . Dr [name] . Tonye Becket, NP . Robbie Lis, PA . Karle Plumber,  PharmD   Please be sure to bring in all your medications bottles to every appointment.

## 2020-05-27 NOTE — Telephone Encounter (Signed)
Patient scheduled to have 24 hour ambulatory blood pressure monitor applied 05/28/2020, 11:00 AM, at [facility] office.

## 2020-05-28 ENCOUNTER — Ambulatory Visit (INDEPENDENT_AMBULATORY_CARE_PROVIDER_SITE_OTHER): Payer: Medicare Other

## 2020-05-28 DIAGNOSIS — R030 Elevated blood-pressure reading, without diagnosis of hypertension: Secondary | ICD-10-CM | POA: Diagnosis not present

## 2020-06-03 ENCOUNTER — Other Ambulatory Visit (HOSPITAL_COMMUNITY): Payer: Self-pay | Admitting: Cardiology

## 2020-06-11 ENCOUNTER — Telehealth (HOSPITAL_COMMUNITY): Payer: Self-pay | Admitting: *Deleted

## 2020-06-11 NOTE — Telephone Encounter (Signed)
Pt called to get her results of bp monitor but no results released. Pt said her bp is 184/95 this morning and has been consistently running high. Pt is asymptomatic but would like results and medication adjustment.   Routed to Dr.McLean

## 2020-06-25 ENCOUNTER — Telehealth (HOSPITAL_COMMUNITY): Payer: Self-pay

## 2020-06-25 NOTE — Telephone Encounter (Signed)
Tried calling patient, no answer,unable to leave message. Will try again later.  Letter mailed to address on file 

## 2020-06-25 NOTE — Telephone Encounter (Signed)
-----   Message from Laurey Morale, MD sent at 06/14/2020 10:02 AM EDT ----- Average BP is elevated.  SBP 110s-120s in the late morning but not markedly low.  Let's have her use graded compression stockings daily, stop taking midodrine regularly, and start amlodipine 2.5 mg daily, take in the afternoon.

## 2020-06-26 MED ORDER — AMLODIPINE BESYLATE 2.5 MG PO TABS: 2.5000 mg | ORAL_TABLET | Freq: Every evening | ORAL | 3 refills | Status: DC

## 2020-06-26 NOTE — Telephone Encounter (Signed)
Pt returned call to triage and left VM  Requesting a script for b/p monitor results   Detailed message left for patient D/c midodrine Start amlodipine at night Wear compression hose daily-if this is new we can place order if needed Advised we normally do not send over new medications until we speak with patient Advised if she had any additional questions to return call.

## 2020-06-26 NOTE — Addendum Note (Signed)
Addended by: Theresia Bough on: 06/26/2020 04:25 PM   Modules accepted: Orders

## 2020-07-02 ENCOUNTER — Telehealth (HOSPITAL_COMMUNITY): Payer: Self-pay | Admitting: *Deleted

## 2020-07-02 NOTE — Telephone Encounter (Signed)
Pt returned called stating she stopped midodrine and started amlodipine 3 days ago. Pt said when she wakes up her bp is 170-200/90-99.  Pt said eventually throughout the day is does come down to low 100's/70's.  Pt said she is just concerned about it being so high when she wakes up. Pt said this has been an issue for years. Denies, headache, chest pain, fatigue, dizziness,etc.   Routed to [employer] for advice

## 2020-07-02 NOTE — Telephone Encounter (Signed)
Called pt no answer/no voice mail picked up the call. Will try patient again later.

## 2020-07-04 NOTE — Telephone Encounter (Signed)
2nd attempt to contact pt. No answer/left vm for pt to return my call

## 2020-07-22 ENCOUNTER — Telehealth (HOSPITAL_COMMUNITY): Payer: Self-pay | Admitting: Pharmacy Technician

## 2020-07-22 NOTE — Telephone Encounter (Signed)
[facility] Patient Advocate Encounter  Received the provider's portion of an Uptravi assistance application from [employer]. Called and touched base with the patient, she is going to mail her portion straight back to [employer]. I reminded her to include her POI with the application.  Will fax once provider's signature is received.   Patient mentioned that over the weekend she feels like her BP was too low while taking Amlodipine. Almost passed out according to the patient. She stopped taking it yesterday. She has an appointment next week, advised her to take her BP everyday and make sure its not too high while she's not taking the Amlodipine.

## 2020-07-23 ENCOUNTER — Other Ambulatory Visit (HOSPITAL_COMMUNITY): Payer: Self-pay

## 2020-07-23 NOTE — Telephone Encounter (Signed)
Sent in provider's portion of application via fax.  Will follow up.

## 2020-07-29 ENCOUNTER — Ambulatory Visit (HOSPITAL_COMMUNITY)
Admission: RE | Admit: 2020-07-29 | Discharge: 2020-07-29 | Disposition: A | Discharge status: Home / Self Care | Payer: Medicare Other | Source: Ambulatory Visit | Attending: Cardiology | Admitting: Cardiology

## 2020-07-29 ENCOUNTER — Encounter (HOSPITAL_COMMUNITY): Payer: Self-pay | Admitting: Cardiology

## 2020-07-29 ENCOUNTER — Other Ambulatory Visit: Payer: Self-pay

## 2020-07-29 VITALS — BP 140/80 | HR 66 | Wt 136.2 lb

## 2020-07-29 DIAGNOSIS — Z79899 Other long term (current) drug therapy: Secondary | ICD-10-CM | POA: Insufficient documentation

## 2020-07-29 DIAGNOSIS — Z7901 Long term (current) use of anticoagulants: Secondary | ICD-10-CM | POA: Diagnosis not present

## 2020-07-29 DIAGNOSIS — I350 Nonrheumatic aortic (valve) stenosis: Secondary | ICD-10-CM | POA: Insufficient documentation

## 2020-07-29 DIAGNOSIS — Z885 Allergy status to narcotic agent status: Secondary | ICD-10-CM | POA: Diagnosis not present

## 2020-07-29 DIAGNOSIS — N183 Chronic kidney disease, stage 3 unspecified: Secondary | ICD-10-CM | POA: Diagnosis not present

## 2020-07-29 DIAGNOSIS — I272 Pulmonary hypertension, unspecified: Secondary | ICD-10-CM | POA: Diagnosis not present

## 2020-07-29 DIAGNOSIS — I482 Chronic atrial fibrillation, unspecified: Secondary | ICD-10-CM | POA: Diagnosis not present

## 2020-07-29 DIAGNOSIS — Z8616 Personal history of COVID-19: Secondary | ICD-10-CM | POA: Insufficient documentation

## 2020-07-29 DIAGNOSIS — I5032 Chronic diastolic (congestive) heart failure: Secondary | ICD-10-CM | POA: Diagnosis present

## 2020-07-29 DIAGNOSIS — I2721 Secondary pulmonary arterial hypertension: Secondary | ICD-10-CM | POA: Insufficient documentation

## 2020-07-29 DIAGNOSIS — Z888 Allergy status to other drugs, medicaments and biological substances status: Secondary | ICD-10-CM | POA: Diagnosis not present

## 2020-07-29 DIAGNOSIS — Z853 Personal history of malignant neoplasm of breast: Secondary | ICD-10-CM | POA: Diagnosis not present

## 2020-07-29 LAB — BASIC METABOLIC PANEL
Anion gap: 9 (ref 5–15)
BUN: 26 mg/dL — ABNORMAL HIGH (ref 8–23)
CO2: 28 mmol/L (ref 22–32)
Calcium: 9.5 mg/dL (ref 8.9–10.3)
Chloride: 100 mmol/L (ref 98–111)
Creatinine, Ser: 1.55 mg/dL — ABNORMAL HIGH (ref 0.44–1.00)
GFR, Estimated: 32 mL/min — ABNORMAL LOW (ref >60)
Glucose, Bld: 93 mg/dL (ref 70–99)
Potassium: 4.2 mmol/L (ref 3.5–5.1)
Sodium: 137 mmol/L (ref 135–145)

## 2020-07-29 LAB — CBC
HCT: 39.5 % (ref 36.0–46.0)
Hemoglobin: 12.6 g/dL (ref 12.0–15.0)
MCH: 29.6 pg (ref 26.0–34.0)
MCHC: 31.9 g/dL (ref 30.0–36.0)
MCV: 92.9 fL (ref 80.0–100.0)
Platelets: 210 10*3/uL (ref 150–400)
RBC: 4.25 MIL/uL (ref 3.87–5.11)
RDW: 14.3 % (ref 11.5–15.5)
WBC: 6.5 10*3/uL (ref 4.0–10.5)
nRBC: 0 % (ref 0.0–0.2)

## 2020-07-29 LAB — BRAIN NATRIURETIC PEPTIDE: B Natriuretic Peptide: 382.3 pg/mL — ABNORMAL HIGH (ref 0.0–100.0)

## 2020-07-29 NOTE — Patient Instructions (Addendum)
6 minute walk test done today.   Labs done today. We will contact you only if your labs are abnormal.  No medication changes were made. Please continue all current medications as prescribed.  Your physician recommends that you schedule a follow-up appointment in: 4 months  If you have any questions or concerns before your next appointment please send [place] a message through [place] or call our office at [phone].    TO LEAVE A MESSAGE FOR THE NURSE SELECT OPTION 2, PLEASE LEAVE A MESSAGE INCLUDING: YOUR NAME DATE OF BIRTH CALL BACK NUMBER REASON FOR CALL**this is important as we prioritize the call backs  YOU WILL RECEIVE A CALL BACK THE SAME DAY AS LONG AS YOU CALL BEFORE 4:00 PM   Do the following things EVERYDAY: Weigh yourself in the morning before breakfast. Write it down and keep it in a log. Take your medicines as prescribed Eat low salt foods--Limit salt (sodium) to 2000 mg per day.  Stay as active as you can everyday Limit all fluids for the day to less than 2 liters   At the [facility], you and your health needs are our priority. As part of our continuing mission to provide you with exceptional heart care, we have created designated Provider Care Teams. These Care Teams include your primary Cardiologist (physician) and Advanced Practice Providers (APPs- Physician Assistants and Nurse Practitioners) who all work together to provide you with the care you need, when you need it.   You may see any of the following providers on your designated Care Team at your next follow up: Dr Arvilla Meres Dr Carron Curie, NP Robbie Lis, [place] Karle Plumber, PharmD   Please be sure to bring in all your medications bottles to every appointment.

## 2020-07-29 NOTE — Progress Notes (Signed)
6 Min Walk Test Completed  Pt ambulated 274.3 meters O2 Sat ranged  95-96% on room air HR ranged 69-75

## 2020-07-29 NOTE — Progress Notes (Signed)
Date:  07/29/2020   ID:  [name], DOB 27-Dec-1929, MRN [id number]  Provider location: [facility] Type of Visit: Established patient  PCP:  Dan Maker., MD  Cardiologist:  Dr. Shirlee Latch   History of Present Illness: Rachael Myers is a 87 y.o. female who has a history of chronic diastolic CHF, chronic atrial fibrillation, and prominent exertional dyspnea.  She has had atrial fibrillation long-term and has failed Multaq and propafenone use.  She is in atrial fibrillation chronically now.  In 1/16, she developed PNA.  She had a parapneumonic effusion that required 4 thoracenteses.  Eventually, she had VATS with decortication and a wedge resection.  Cytology was negative from the pleural fluid.  Last chest CT was at [facility] in 8/16 and showed no PE, mild interstitial edema.  Last echo was done in 11/16, showing normal LV EF and apparently normal RV EF.  PA systolic pressure estimation was moderate to severely increased.  I took her for RHC in 12/16, this showed moderate pulmonary arterial hypertension.  PFTs showed a restrictive pattern concerning for interstitial lung disease.  V/Q scan in 12/16 was not suggestive of chronic PE.  Despite abnormal PFTs, high resolution CT did not show interstitial lung disease.     At a prior appointment, started her on Opsumit.  She developed a cough and mouth soreness with this medication and had to stop it after about 7 days.  Symptoms resolved when she stopped it.  Tried to get Adcirca for her.  This was denied by her insurance company, so she was started on Revatio 20 mg tid instead.  She thinks that this has helped her breathing "a little."  She saw a pulmonary specialist who thought dyspnea was likely due to [facility].  Next, tried her on ambrisentan, but she developed facial swelling on ambrisentan (significant around the eyes).  She also developed worsening dyspnea.  She stopped ambrisentan and the swelling resolved but she was  noted to be volume overloaded.  Switched her Lasix to torsemide.  She was started on selexipag which she has tolerated.    She had a difficult winter with episode of PNA in 2/20.  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 There was moderate emphysema though she never smoked.  She says that her pulmonologist in [facility] followed up on her CT with a PET scan that was normal.   She had a COVID-19 infection in 12/20 but appears to have recovered well.   Echo in 5/21 showed EF 60-65%, mild LVH, PASP 33 mmHg, normal RV, mild MR, mild-moderate AS with mean gradient 14 mmHg, AVA 1.23 cm^2.  PYP scan in 5/21 was not suggestive of TTR amyloidosis.   [name] and had mastectomy (no chemo or radiation).   Echo in 5/22 showed EF 55-60%, mildly decreased RV systolic function, PASP 47, mild MR, mild AS mean gradient 10 mmHg.   She returns today followup of pulmonary hypertension and orthostatic hypotension.  BP still has marked fluctuations.  Readings vary from 110s-170s systolic.  24 hour BP monitor in 5/22 showed mean 152/73.  I tried her on amlodipine 2.5 mg daily but she developed orthostatic symptoms so this was stopped.  She is also now off midodrine.  Currently, no dizziness or falls.  No dyspnea with normal activities.  Stays active doing work at [employer].  No chest pain, no orthopnea/PND.      Labs (9/16): K 4, creatinine 1.31, BNP 466 Labs (11/16): BNP 577 Labs (12/16); K 4, creatinine 1.3, normal rheumatoid factor and scleroderma serologies Labs (1/17): ANA positive but only 1:80 titer, dsDNA negative, SSA/B negative, CCP negative.  Labs (2/17): K 4.1, creatinine 1.48 => 1.42, BNP 487 Labs (3/17): K 4.2, creatinine 1.58, BNP 326 Labs (7/17): K 3.3, creatinine 1.7, hgb 10.7 Labs (8/17): K 3.9, creatinine 1.87, HCT 35.6 Labs (1/18): hgb 9.9, creatinine  1.42 Labs (4/18): TSH normal, hgb 10.5, K 3.8, creatinine 1.34 Labs (6/18): BNP 415, K 4.9, creatinine 1.45 Labs (1/19): K 4.4, creatinine 1.56, hgb 11.3 Labs (7/19): K 4, creatinine 1.5, BNP 381 Labs (1/20): K 3.8, creatinine 1.38 Labs (11/20): K 3.8, creatinine 1.59 Labs (5/21): K 3.8, creatinine 1.4, myeloma panel negative, urine immunofixation negative Labs (8/21): BNP 319, hgb 10.2, K 4.4, creatinine 1.7 Labs (5/22): LDL 87, TSH normal, hgb 12.6, K 3.9, creatinine 1.26   PMH: 1. Cardiolite (9/16) with EF 54%, no ischemia/infarction. 2. Chronic diastolic CHF: Echo (11/16) with EF 55-60%, normal RV size and systolic function, PA systolic pressure 66 mmHg.  - Echo (7/17) with EF 60-65%, mildly dilated RV with mildly decreased systolic function, PA systolic pressure 36 mmHg.  - Echo (7/18) with EF 55-60%, mild to moderate AI, normal RV size and systolic function, PASP 43 mmHg.  - Echo (1/20) with EF 55-60%, normal RV size and systolic function, mild AI, mild MR.  - Echo (5/21) witih EF 60-65%, mild LVH, PASP 33 mmHg, normal RV, mild MR, mild-moderate AS with mean gradient 14 mmHg, AVA 1.23 cm^2.  - PYP scan (5/21): grade 0, H/CL 1.1 (not suggestive of TTR amyloidosis).  - Echo (5/22): EF 55-60%, mildly decreased RV systolic function, PASP 47, mild MR, mild AS mean gradient 10 mmHg. 3. Chronic atrial fibrillation: She has been intolerant of Multaq and propafenone.  Sotalol and flecainide have not been used due to CKD.   4. SBO: Managed conservatively. 5. CKD 6. OA: h/o THR and TKR.  7. H/o hyponatremia 8. Pneumonia with parapneumonic effusion with thoracenteses and eventual VATS and decortication.  9. Pulmonary hypertension: Suspect Group 1 pulmonary hypertension.  RHC (12/16) with mean RA 5, PA 62/22 mean 40, mean PCWP 19, CI 2.46, PVR 5 WU.  PFTs (12/16) wiith FVC 71%, FEV1 73%, ratio 104%, TLC 71%, DLCO 53% => moderate restriction concerning for interstitial lung disease.  High  resolution CT (12/16) did not show evidence for interstitial lung disease.  V/Q scan (12/16) did not show evidence for chronic PE.  Serologies: RF negative, scleroderma antibodies negative, ANA positive but only 1:80 titer, SSA/B negative, dsDNA negative, CCP negative.  She did not tolerate macitentan or ambrisentan.  Echo 7/17 with PA systolic pressure down to 36 mmHg.   - 6 minute walk (12/16) with 244 meters - 6 minute walk (2/17) with 232 meters - 6 minute walk (3/17) with 414 meters - 6 minute walk (4/17) with 267 meters - 6 minute walk (8/17) with 241 meters - 6 minute walk (10/17) with 256 meters - 6 minute walk (6/18) with 305 meters - 6 minute walk (9/18) with 320 meters - 6 minute walk (1/19) with 213 meters - 6 minute walk (7/19) with 335 meters - 6 minute walk (1/20) with 213 meters - 6 minute walk (10/20) with 243 meters - 6 minute walk (2/21) with 243 meters - 6 minute walk (8/21) with 214 meters -  6 minute walk (2/22) with 260 meters - 6 minute walk (7/22) with 274 meters 10. Lightheadedness/syncope: Extensive workup.  Event and holter monitors as well as telemetry monitoring negative.  She has not been orthostatic.  - Did not tolerate pyridostigmine due to hot flashes.  11. Sciatica: Status post back surgery 1/18.  12. COVID-19 infection 12/20.  13. Peripheral neuropathy 14. Aortic stenosis: Mild to moderate on 5/21 echo.  15. Breast cancer: Triple negative on left, s/p mastectomy in 7/21.     Current Outpatient Medications  Medication Sig Dispense Refill   amoxicillin (AMOXIL) 500 MG capsule TAKE 4 CAPSULES BY MOUTH ONE HOUR PRIOR TO DENTAL APPOINTMENT     gabapentin (NEURONTIN) 300 MG capsule Take 300 mg by mouth daily.     ipratropium-albuterol (DUONEB) 0.5-2.5 (3) MG/3ML SOLN USE 1 VIAL IN NEBULIZER EVERY 6 HOURS - And As Needed     metoprolol succinate (TOPROL-XL) 25 MG 24 hr tablet Take 1 tablet (25 mg total) by mouth at bedtime. 90 tablet 3   potassium chloride  (MICRO-K) 10 MEQ CR capsule Take 10 mEq by mouth daily.     Rivaroxaban (XARELTO) 15 MG TABS tablet Take 1 tablet (15 mg total) by mouth daily with supper. 90 tablet 3   sildenafil (REVATIO) 20 MG tablet Take 4 tablets (80 mg total) by mouth 3 (three) times daily. 360 tablet 11   torsemide (DEMADEX) 20 MG tablet Take 20 mg by mouth daily.     UPTRAVI 1000 MCG TABS Take 1 tablet by mouth 2 (two) times daily. 180 tablet 3   No current facility-administered medications for this encounter.    Allergies:   Acetaminophen, Hydrocodone-acetaminophen, Letairis [ambrisentan], Opsumit [macitentan], Ace inhibitors, Atenolol, Hctz [hydrochlorothiazide], Oxycodone, and Diltiazem hcl   Social History:  The patient  reports that she has never smoked. She has never used smokeless tobacco. She reports that she does not drink alcohol and does not use drugs.   Family History:  The patient's family history includes Cancer in her mother.   ROS:  Please see the history of present illness.   All other systems are personally reviewed and negative.   Exam:   BP 140/80   Pulse 66   Wt 61.8 kg (136 lb 3.2 oz)   SpO2 96%   BMI 21.33 kg/m  General: NAD Neck: JVP 8 cm, no thyromegaly or thyroid nodule.  Lungs: Clear to auscultation bilaterally with normal respiratory effort. CV: Nondisplaced PMI.  Heart regular S1/S2, no S3/S4, 2/6 early SEM RUSB.  No peripheral edema.  No carotid bruit.  Normal pedal pulses.  Abdomen: Soft, nontender, no hepatosplenomegaly, no distention.  Skin: Intact without lesions or rashes.  Neurologic: Alert and oriented x 3.  Psych: Normal affect. Extremities: No clubbing or cyanosis.  HEENT: Normal.   Recent Labs: 07/29/2020: B Natriuretic Peptide 382.3; BUN 26; Creatinine, Ser 1.55; Hemoglobin 12.6; Platelets 210; Potassium 4.2; Sodium 137  Personally reviewed   Wt Readings from Last 3 Encounters:  07/29/20 61.8 kg (136 lb 3.2 oz)  05/27/20 61 kg (134 lb 6.4 oz)  02/21/20 60.7 kg  (133 lb 12.8 oz)     ASSESSMENT AND PLAN: 1. Chronic diastolic CHF:  NYHA class II symptoms (stable).  Minimal volume overload on exam.    - Continue torsemide 20 mg daily, BMET/BNP today.       2. Pulmonary hypertension: Moderate to severe pulmonary hypertension by echo (PASP 66 mmHg). RHC showed moderate pulmonary arterial hypertension with PVR 5 WU.  PFTs with restrictive changes suggestive of interstitial process but high resolution CT did not show interstitial lung disease.  No evidence for chronic PE on V/Q scan.  Rheumatologic serologies all negative.  Possible group 1 pulmonary hypertension.  She was unable to tolerate macitentan or ambrisentan.  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  She is now on sildenafil 80 mg tid and selexipag 1000 mcg bid, unable to increase selexipag further due to symptoms.  Echo in 5/22 showed mildly decreased RV systolic function and PASP 47 mmHg. Stable 6 minute walk today.  - Continue Selexipag at 1000 mcg bid, unable to tolerate uptitration of Selexipag any further.  - Continue sildenafil 80 mg three times a day.   - Check BNP today.  3. Pulmonary: s/p PNA with parapneumonic effusion and eventual VATS with decortication.  As above, restrictive PFTs but no ILD on high resolution CT.  CT in 4/20 showed a concerning RLL nodule and emphysema, though she never smoked. Her pulmonologist ordered a PET scan after this per her report, and PET was normal.  4. Atrial fibrillation: Chronic. Following rate control/anticoagulation strategy.   - Continue Toprol XL. - Continue Xarelto 15 mg daily, no bleeding problems.  Recent CBC stable.  5. HTN: Marked BP fluctuations between the early morning and mid-day.  I am worried that she is a fall risk when her BP drops.  24 hour BP monitor in 5/22 showed average BP 152/73.  I am satisfied with this, will allow BP to run a little high to avoid orthostatic symptoms and falls.  - Stay off midodrine.  - She did not tolerate amlodipine due  to orthostatic symptoms.   - Continue to wear compression stockings.  - Continue Toprol XL to 25 mg at bedtime given very high BP when she first gets up in the morning.  6. CKD: Stage 3.  BMET today.  7. Aortic stenosis: Mild on 5/22 echo.   Followup in 4 months.   Signed, Marca Anconaalton Shakil Dirk, MD  07/29/2020  [facility] [address] [facility] [place] [address] [phone] (office) [phone] (fax)

## 2020-08-01 NOTE — Telephone Encounter (Signed)
Sent in the patient's portion of application via fax.  Will follow up.

## 2020-08-11 ENCOUNTER — Other Ambulatory Visit (HOSPITAL_COMMUNITY): Payer: Self-pay

## 2020-08-12 ENCOUNTER — Other Ambulatory Visit (HOSPITAL_COMMUNITY): Payer: Self-pay

## 2020-08-12 NOTE — Telephone Encounter (Signed)
[facility] Patient Advocate Encounter  Called [employer] patient assistance yesterday to check the status of patient's assistance application. [employer] stated that the patient has [employer] benefits, making her ineligible for assistance through their program. [employer] referred her to [facility] for assistance options.  Called and spoke with the patient, asked her if she had spoken to anyone at [facility]. She stated that she did not. Called and spoke with Linwood Dibbles today. I explained that the patient has [place] and does not see a VA provider, her husband was the recipient of VA benefits. They are going to reach out to [facility] and see what the patient's co-pay is. That will determine what assistance options are available to her.   I asked to receive a return call, representative advised that it would be about 48 hours before an update is given.

## 2020-08-19 ENCOUNTER — Other Ambulatory Visit (HOSPITAL_COMMUNITY): Payer: Self-pay | Admitting: Cardiology

## 2020-08-21 NOTE — Telephone Encounter (Signed)
Called Rachael Myers to check the status of the patient's application. Representative stated that the patient's 30 day co-pay was $38. There were no assistance options due to the co-pay being that low. Called and left the patient message.   Archer Asa, CPhT

## 2020-12-01 ENCOUNTER — Encounter (HOSPITAL_COMMUNITY): Payer: Medicare Other | Admitting: Cardiology

## 2021-01-13 ENCOUNTER — Encounter (HOSPITAL_COMMUNITY): Payer: Self-pay | Admitting: Cardiology

## 2021-01-13 ENCOUNTER — Ambulatory Visit (HOSPITAL_COMMUNITY)
Admission: RE | Admit: 2021-01-13 | Discharge: 2021-01-13 | Disposition: A | Discharge status: Home / Self Care | Payer: Medicare Other | Source: Ambulatory Visit | Attending: Cardiology | Admitting: Cardiology

## 2021-01-13 ENCOUNTER — Other Ambulatory Visit: Payer: Self-pay

## 2021-01-13 VITALS — BP 150/88 | HR 78 | Wt 142.6 lb

## 2021-01-13 DIAGNOSIS — N183 Chronic kidney disease, stage 3 unspecified: Secondary | ICD-10-CM | POA: Insufficient documentation

## 2021-01-13 DIAGNOSIS — I350 Nonrheumatic aortic (valve) stenosis: Secondary | ICD-10-CM | POA: Diagnosis not present

## 2021-01-13 DIAGNOSIS — I272 Pulmonary hypertension, unspecified: Secondary | ICD-10-CM | POA: Diagnosis not present

## 2021-01-13 DIAGNOSIS — I130 Hypertensive heart and chronic kidney disease with heart failure and stage 1 through stage 4 chronic kidney disease, or unspecified chronic kidney disease: Secondary | ICD-10-CM | POA: Diagnosis present

## 2021-01-13 DIAGNOSIS — Z79899 Other long term (current) drug therapy: Secondary | ICD-10-CM | POA: Diagnosis not present

## 2021-01-13 DIAGNOSIS — I482 Chronic atrial fibrillation, unspecified: Secondary | ICD-10-CM | POA: Diagnosis not present

## 2021-01-13 DIAGNOSIS — Z853 Personal history of malignant neoplasm of breast: Secondary | ICD-10-CM | POA: Insufficient documentation

## 2021-01-13 DIAGNOSIS — Z9012 Acquired absence of left breast and nipple: Secondary | ICD-10-CM | POA: Insufficient documentation

## 2021-01-13 DIAGNOSIS — Z7901 Long term (current) use of anticoagulants: Secondary | ICD-10-CM | POA: Diagnosis not present

## 2021-01-13 DIAGNOSIS — I5032 Chronic diastolic (congestive) heart failure: Secondary | ICD-10-CM | POA: Diagnosis not present

## 2021-01-13 DIAGNOSIS — I2721 Secondary pulmonary arterial hypertension: Secondary | ICD-10-CM | POA: Insufficient documentation

## 2021-01-13 DIAGNOSIS — Z8673 Personal history of transient ischemic attack (TIA), and cerebral infarction without residual deficits: Secondary | ICD-10-CM | POA: Diagnosis not present

## 2021-01-13 DIAGNOSIS — Z888 Allergy status to other drugs, medicaments and biological substances status: Secondary | ICD-10-CM | POA: Diagnosis not present

## 2021-01-13 DIAGNOSIS — Z8616 Personal history of COVID-19: Secondary | ICD-10-CM | POA: Diagnosis not present

## 2021-01-13 LAB — BASIC METABOLIC PANEL
Anion gap: 11 (ref 5–15)
BUN: 20 mg/dL (ref 8–23)
CO2: 26 mmol/L (ref 22–32)
Calcium: 9.4 mg/dL (ref 8.9–10.3)
Chloride: 100 mmol/L (ref 98–111)
Creatinine, Ser: 1.44 mg/dL — ABNORMAL HIGH (ref 0.44–1.00)
GFR, Estimated: 35 mL/min — ABNORMAL LOW (ref >60)
Glucose, Bld: 102 mg/dL — ABNORMAL HIGH (ref 70–99)
Potassium: 3.4 mmol/L — ABNORMAL LOW (ref 3.5–5.1)
Sodium: 137 mmol/L (ref 135–145)

## 2021-01-13 LAB — BRAIN NATRIURETIC PEPTIDE: B Natriuretic Peptide: 444.2 pg/mL — ABNORMAL HIGH (ref 0.0–100.0)

## 2021-01-13 MED ORDER — POTASSIUM CHLORIDE ER 10 MEQ PO CPCR: 20.0000 meq | ORAL_CAPSULE | Freq: Every day | ORAL | 4 refills | Status: DC

## 2021-01-13 MED ORDER — TORSEMIDE 20 MG PO TABS: 20.0000 mg | ORAL_TABLET | Freq: Every day | ORAL | 11 refills | Status: DC

## 2021-01-13 NOTE — Patient Instructions (Signed)
Medication Changes:  Increase Torsemide to 40mg  (2 tabs)daily for 5 days  then  restart 20 mg (1 tablet) daily  Increase Potasium 20 meq daily  Lab Work:  Labs done today, your results will be available in MyChart, we will contact you for abnormal readings. Follow up lab work in 10 days locally, Prescription provided  Testing/Procedures:  none  Referrals:  none  Special Instructions // Education:  none  Follow-Up in: 3 weeks  At the [facility], you and your health needs are our priority. We have a designated team specialized in the treatment of Heart Failure. This Care Team includes your primary Heart Failure Specialized Cardiologist (physician), Advanced Practice Providers (APPs- Physician Assistants and Nurse Practitioners), and Pharmacist who all work together to provide you with the care you need, when you need it.   You may see any of the following providers on your designated Care Team at your next follow up:  Dr Dr Arvilla Meres, NP Carron Curie, Robbie Lis [facility] [place], [place], PharmD   Please be sure to bring in all your medications bottles to every appointment.   Need to Contact Karle Plumber:  If you have any questions or concerns before your next appointment please send [place] a message through [place] or call our office at [phone].    TO LEAVE A MESSAGE FOR THE NURSE SELECT OPTION 2, PLEASE LEAVE A MESSAGE INCLUDING: YOUR NAME DATE OF BIRTH CALL BACK NUMBER REASON FOR CALL**this is important as we prioritize the call backs  YOU WILL RECEIVE A CALL BACK THE SAME DAY AS LONG AS YOU CALL BEFORE 4:00 PM

## 2021-01-13 NOTE — Progress Notes (Addendum)
Date:  01/13/2021   ID:  Rachael Myers, DOB [birth year], MRN [id number]  Provider location: [facility] Type of Visit: Established patient  PCP:  Myrtis Hopping., MD  Cardiologist:  Dr. Aundra Dubin   History of Present Illness: Rachael Myers is a 88 y.o. female who has a history of chronic diastolic CHF, chronic atrial fibrillation, and prominent exertional dyspnea.  She has had atrial fibrillation long-term and has failed Multaq and propafenone use.  She is in atrial fibrillation chronically now.  In 1/16, she developed PNA.  She had a parapneumonic effusion that required 4 thoracenteses.  Eventually, she had VATS with decortication and a wedge resection.  Cytology was negative from the pleural fluid.  Last chest CT was at [facility] in 8/16 and showed no PE, mild interstitial edema.  Last echo was done in 11/16, showing normal LV EF and apparently normal RV EF.  PA systolic pressure estimation was moderate to severely increased.  I took her for [place] in 12/16, this showed moderate pulmonary arterial hypertension.  PFTs showed a restrictive pattern concerning for interstitial lung disease.  V/Q scan in 12/16 was not suggestive of chronic PE.  Despite abnormal PFTs, high resolution CT did not show interstitial lung disease.     At a prior appointment, started her on Opsumit.  She developed a cough and mouth soreness with this medication and had to stop it after about 7 days.  Symptoms resolved when she stopped it.  Tried to get Adcirca for her.  This was denied by her insurance company, so she was started on Revatio 20 mg tid instead.  She thinks that this has helped her breathing "a little."  She saw a pulmonary specialist who thought dyspnea was likely due to [facility].  Next, tried her on ambrisentan, but she developed facial swelling on ambrisentan (significant around the eyes).  She also developed worsening dyspnea.  She stopped ambrisentan and the swelling resolved but she was  noted to be volume overloaded.  Switched her Lasix to torsemide.  She was started on selexipag which she has tolerated.    She had a difficult winter with episode of PNA in 2/20.  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 There was moderate emphysema though she never smoked.  She says that her pulmonologist in [facility] followed up on her CT with a PET scan that was normal.   She had a COVID-19 infection in 12/20 but appears to have recovered well.   Echo in 5/21 showed EF 60-65%, mild LVH, PASP 33 mmHg, normal RV, mild MR, mild-moderate AS with mean gradient 14 mmHg, AVA 1.23 cm^2.  PYP scan in 5/21 was not suggestive of TTR amyloidosis.   [name] and had mastectomy (no chemo or radiation).   Echo in 5/22 showed EF 55-60%, mildly decreased RV systolic function, PASP 47, mild MR, mild AS mean gradient 10 mmHg.   She had a fall in 11/22 at home, seems to have been mechanical.  She developed a scalp hematoma and bruising on her arm.  Xarelto was held.  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  Xarelto was restarted.  She was sent to rehab for 3 wks and was not given her torsemide.  She developed significant peripheral edema and worsening dyspnea.  Just recently, she has started back on torsemide 20 daily  alternating with 10 daily. No further falls, no lightheadedness.  She walks with a walker, notes dyspnea with moderate exertion but does ok walking around the house. Weight is up 6 lbs.  She is doing PT.  BP is mildly elevated today.   Labs (9/16): K 4, creatinine 1.31, BNP 466 Labs (11/16): BNP 577 Labs (12/16); K 4, creatinine 1.3, normal rheumatoid factor and scleroderma serologies Labs (1/17): ANA positive but only 1:80 titer, dsDNA negative, SSA/B negative, CCP negative.  Labs (2/17): K 4.1, creatinine 1.48 => 1.42, BNP 487 Labs (3/17): K  4.2, creatinine 1.58, BNP 326 Labs (7/17): K 3.3, creatinine 1.7, hgb 10.7 Labs (8/17): K 3.9, creatinine 1.87, HCT 35.6 Labs (1/18): hgb 9.9, creatinine 1.42 Labs (4/18): TSH normal, hgb 10.5, K 3.8, creatinine 1.34 Labs (6/18): BNP 415, K 4.9, creatinine 1.45 Labs (1/19): K 4.4, creatinine 1.56, hgb 11.3 Labs (7/19): K 4, creatinine 1.5, BNP 381 Labs (1/20): K 3.8, creatinine 1.38 Labs (11/20): K 3.8, creatinine 1.59 Labs (5/21): K 3.8, creatinine 1.4, myeloma panel negative, urine immunofixation negative Labs (8/21): BNP 319, hgb 10.2, K 4.4, creatinine 1.7 Labs (5/22): LDL 87, TSH normal, hgb 12.6, K 3.9, creatinine 1.26 Labs (11/22): LDL 72 Labs (12/22): K 3.7, creatinine 1.59   PMH: 1. Cardiolite (9/16) with EF 54%, no ischemia/infarction. 2. Chronic diastolic CHF: Echo ([id number]) with EF 55-60%, normal RV size and systolic function, PA systolic pressure 66 mmHg.  - Echo (7/17) with EF 60-65%, mildly dilated RV with mildly decreased systolic function, PA systolic pressure 36 mmHg.  - Echo (7/18) with EF 55-60%, mild to moderate AI, normal RV size and systolic function, PASP 43 mmHg.  - Echo (1/20) with EF 55-60%, normal RV size and systolic function, mild AI, mild MR.  - Echo (5/21) witih EF 60-65%, mild LVH, PASP 33 mmHg, normal RV, mild MR, mild-moderate AS with mean gradient 14 mmHg, AVA 1.23 cm^2.  - PYP scan (5/21): grade 0, H/CL 1.1 (not suggestive of TTR amyloidosis).  - Echo (5/22): EF 55-60%, mildly decreased RV systolic function, PASP 47, mild MR, mild AS mean gradient 10 mmHg. 3. Chronic atrial fibrillation: She has been intolerant of Multaq and propafenone.  Sotalol and flecainide have not been used due to CKD.   4. SBO: Managed conservatively. 5. CKD stage 3 6. OA: h/o THR and TKR.  7. H/o hyponatremia 8. Pneumonia with parapneumonic effusion with thoracenteses and eventual VATS and decortication.  9. Pulmonary hypertension: Suspect Group 1 pulmonary hypertension.  RHC  (12/16) with mean RA 5, PA 62/22 mean 40, mean PCWP 19, CI 2.46, PVR 5 WU.  PFTs (12/16) wiith FVC 71%, FEV1 73%, ratio 104%, TLC 71%, DLCO 53% => moderate restriction concerning for interstitial lung disease.  High resolution CT (12/16) did not show evidence for interstitial lung disease.  V/Q scan (12/16) did not show evidence for chronic PE.  Serologies: RF negative, scleroderma antibodies negative, ANA positive but only 1:80 titer, SSA/B negative, dsDNA negative, CCP negative.  She did not tolerate macitentan or ambrisentan.  Echo [id number] with PA systolic pressure down to 36 mmHg.   - 6 minute walk (12/16) with 244 meters - 6 minute walk (2/17) with 232 meters - 6 minute walk (3/17) with 414 meters - 6 minute walk (4/17) with 267 meters - 6 minute walk (8/17) with 241 meters - 6 minute walk (10/17) with 256 meters - 6 minute walk (6/18) with 305 meters - 6 minute walk (9/18) with 320 meters - 6  minute walk (1/19) with 213 meters - 6 minute walk (7/19) with 335 meters - 6 minute walk (1/20) with 213 meters - 6 minute walk (10/20) with 243 meters - 6 minute walk (2/21) with 243 meters - 6 minute walk (8/21) with 214 meters - 6 minute walk (2/22) with 260 meters - 6 minute walk (7/22) with 274 meters 10. Lightheadedness/syncope: Extensive workup.  Event and holter monitors as well as telemetry monitoring negative.  She has not been orthostatic.  - Did not tolerate pyridostigmine due to hot flashes.  11. Sciatica: Status post back surgery 1/18.  12. COVID-19 infection 12/20.  13. Peripheral neuropathy 14. Aortic stenosis: Mild to moderate on 5/21 echo.  15. Breast cancer: Triple negative on left, s/p mastectomy in 7/21.  16. CVA: 11/22, occurred while off anticoagulation.  - Carotid dopplers (11/22) showed 1-39% BICA stenosis.     Current Outpatient Medications  Medication Sig Dispense Refill   amoxicillin (AMOXIL) 500 MG capsule TAKE 4 CAPSULES BY MOUTH ONE HOUR PRIOR TO DENTAL  APPOINTMENT     gabapentin (NEURONTIN) 300 MG capsule Take 300 mg by mouth daily.     ipratropium-albuterol (DUONEB) 0.5-2.5 (3) MG/3ML SOLN USE 1 VIAL IN NEBULIZER EVERY 6 HOURS - And As Needed     metoprolol succinate (TOPROL-XL) 25 MG 24 hr tablet Take 12.5 mg by mouth at bedtime.     Prenatal Vit-Fe Psac Cmplx-FA (POLY IRON PN FORTE PO) Take 1 tablet by mouth daily.     Rivaroxaban (XARELTO) 15 MG TABS tablet Take 1 tablet (15 mg total) by mouth daily with supper. 90 tablet 3   sildenafil (REVATIO) 20 MG tablet Take 4 tablets (80 mg total) by mouth 3 (three) times daily. 360 tablet 11   UPTRAVI 1000 MCG TABS Take 1 tablet by mouth 2 (two) times daily. 180 tablet 3   potassium chloride (MICRO-K) 10 MEQ CR capsule Take 2 capsules (20 mEq total) by mouth daily. 60 capsule 4   torsemide (DEMADEX) 20 MG tablet Take 1 tablet (20 mg total) by mouth daily. 30 tablet 11   No current facility-administered medications for this encounter.    Allergies:   Acetaminophen, Hydrocodone-acetaminophen, Letairis [ambrisentan], Opsumit [macitentan], Ace inhibitors, Atenolol, Hctz [hydrochlorothiazide], Oxycodone, and Diltiazem hcl   Social History:  The patient  reports that she has never smoked. She has never used smokeless tobacco. She reports that she does not drink alcohol and does not use drugs.   Family History:  The patient's family history includes Cancer in her mother.   ROS:  Please see the history of present illness.   All other systems are personally reviewed and negative.   Exam:   BP (!) 150/88    Pulse 78    Wt 64.7 kg (142 lb 9.6 oz)    SpO2 95%    BMI 22.33 kg/m  General: NAD Neck: JVP 10-12 cm, no thyromegaly or thyroid nodule.  Lungs: Clear to auscultation bilaterally with normal respiratory effort. CV: Nondisplaced PMI.  Heart irregular S1/S2, no S3/S4, 2/6 SEM RUSB.  2+ edema to knees bilaterally.  No carotid bruit.  Normal pedal pulses.  Abdomen: Soft, nontender, no  hepatosplenomegaly, no distention.  Skin: Intact without lesions or rashes.  Neurologic: Alert and oriented x 3.  Psych: Normal affect. Extremities: No clubbing or cyanosis.  HEENT: Normal.   Recent Labs: 07/29/2020: Hemoglobin 12.6; Platelets 210 01/13/2021: B Natriuretic Peptide 444.2; BUN 20; Creatinine, Ser 1.44; Potassium 3.4; Sodium 137  Personally reviewed  Wt Readings from Last 3 Encounters:  01/13/21 64.7 kg (142 lb 9.6 oz)  07/29/20 61.8 kg (136 lb 3.2 oz)  05/27/20 61 kg (134 lb 6.4 oz)     ASSESSMENT AND PLAN: 1. Chronic diastolic CHF:  Patient is volume overloaded on exam today.  Her torsemide was stopped for several weeks while hospitalized and in rehab facility. NYHA class III symptoms.   - She will take torsemide 40 mg daily x 5 days, then decrease to 20 mg daily after that.  BMET/BNP today and BMET again in 10 days (can do locally).  - Increase KCl to 20 mEq daily.       - Continue compression stockings.  2. Pulmonary hypertension: Moderate to severe pulmonary hypertension by echo (PASP 66 mmHg). RHC showed moderate pulmonary arterial hypertension with PVR 5 WU.  PFTs with restrictive changes suggestive of interstitial process but high resolution CT did not show interstitial lung disease.  No evidence for chronic PE on V/Q scan.  Rheumatologic serologies all negative.  Possible group 1 pulmonary hypertension.  She was unable to tolerate macitentan or ambrisentan.  Insurance would not approve [place], site.  She is now on sildenafil 80 mg tid and selexipag 1000 mcg bid, unable to increase selexipag further due to symptoms.  Echo in 5/22 showed mildly decreased RV systolic function and PASP 47 mmHg.  - Defer 6 minute walk with fall, CVA, and walker use.  - Continue Selexipag at 1000 mcg bid, unable to tolerate uptitration of Selexipag any further.  - Continue sildenafil 80 mg three times a day.   - Check BNP today.  3. Pulmonary: s/p PNA with parapneumonic effusion and eventual  VATS with decortication.  As above, restrictive PFTs but no ILD on high resolution CT.  CT in 4/20 showed a concerning RLL nodule and emphysema, though she never smoked. Her pulmonologist ordered a PET scan after this per her report, and PET was normal.  4. Atrial fibrillation: Chronic. Following rate control/anticoagulation strategy.   - Continue Toprol XL. - Continue Xarelto 15 mg daily.  5. HTN: She has tended to have marked BP fluctuations as well as orthostatic hypotension.  24 hour BP monitor in 5/22 showed average BP 152/73. SBP today is around 150. I am satisfied with this, will allow BP to run a little high to avoid orthostatic symptoms and falls.  - She did not tolerate amlodipine due to orthostatic symptoms.   - Continue to wear compression stockings.  - Continue Toprol XL 12.5 mg at bedtime given very high BP when she first gets up in the morning.  6. CKD: Stage 3.  BMET today.  7. Aortic stenosis: Mild on 5/22 echo.  8. CVA: She had a stroke in 11/22 while off Xarelto.  Carotid dopplers were unremarkable.  Her stroke was atrial fibrillation-related.  She has been referred to a neurologist and wants to know if she has to go to the appointment.  I am not sure that it would change anything, she needs to stay on Xarelto if at all possible.   Followup in 3 wks with APP.  She needs to continue followup in this clinic as there is no one in [place] to manage her pulmonary hypertension medications.  Her daughter is able to get her here.   Signed, Loralie Champagne, MD  01/13/2021  [place] [address] [facility] and [place] [address] [phone] (office) [phone] (fax)

## 2021-02-06 NOTE — Progress Notes (Signed)
Date:  02/09/2021   ID:  Rachael Myers, DOB 1929-03-30, MRN [id number]  Provider location: [facility] Type of Visit: Established patient  PCP:  Rachael Myers., MD  Cardiologist:  Dr. Aundra Dubin   History of Present Illness: Rachael Myers is a 88 y.o. female who has a history of chronic diastolic CHF, chronic atrial fibrillation, and prominent exertional dyspnea.  She has had atrial fibrillation long-term and has failed Multaq and propafenone use.  She is in atrial fibrillation chronically now.  In 1/16, she developed PNA.  She had a parapneumonic effusion that required 4 thoracenteses.  Eventually, she had VATS with decortication and a wedge resection.  Cytology was negative from the pleural fluid.  Last chest CT was at [facility] in 8/16 and showed no PE, mild interstitial edema.  Last echo was done in 11/16, showing normal LV EF and apparently normal RV EF.  PA systolic pressure estimation was moderate to severely increased.  I took her for [place] in 12/16, this showed moderate pulmonary arterial hypertension.  PFTs showed a restrictive pattern concerning for interstitial lung disease.  V/Q scan in 12/16 was not suggestive of chronic PE.  Despite abnormal PFTs, high resolution CT did not show interstitial lung disease.     At a prior appointment, started her on Opsumit.  She developed a cough and mouth soreness with this medication and had to stop it after about 7 days.  Symptoms resolved when she stopped it.  Tried to get Adcirca for her.  This was denied by her insurance company, so she was started on Revatio 20 mg tid instead.  She thinks that this has helped her breathing "a little."  She saw a pulmonary specialist who thought dyspnea was likely due to [facility].  Next, tried her on ambrisentan, but she developed facial swelling on ambrisentan (significant around the eyes).  She also developed worsening dyspnea.  She stopped ambrisentan and the swelling resolved but she was  noted to be volume overloaded.  Switched her Lasix to torsemide.  She was started on selexipag which she has tolerated.    She had a difficult winter with episode of PNA in 2/20.  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 There was moderate emphysema though she never smoked.  She says that her pulmonologist in [facility] followed up on her CT with a PET scan that was normal.   She had a COVID-19 infection in 12/20 but appears to have recovered well.   Echo in 5/21 showed EF 60-65%, mild LVH, PASP 33 mmHg, normal RV, mild MR, mild-moderate AS with mean gradient 14 mmHg, AVA 1.23 cm^2.  PYP scan in 5/21 was not suggestive of TTR amyloidosis.   [name] and had mastectomy (no chemo or radiation).   Echo in 5/22 showed EF 55-60%, mildly decreased RV systolic function, PASP 47, mild MR, mild AS mean gradient 10 mmHg.   She had a fall in 11/22 at home, seems to have been mechanical.  She developed a scalp hematoma and bruising on her arm.  Xarelto was held.  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  Xarelto was restarted.  She was sent to rehab for 3 wks and was not given her torsemide.    Today she returns for HF follow up with her daughter. Overall feeling fine. She is not short  of breath with ADLs or walking on flat ground. She walks with a walker. Denies abnormal bleeding, CP, dizziness, or PND/Orthopnea. Appetite ok. No fever or chills. Weight at home 140 pounds. Taking all medications. BP at home this AM 129/72. She is wearing her compression hose but notices some RLE swelling.  ReDs 46%.  Labs (9/16): K 4, creatinine 1.31, BNP 466 Labs (11/16): BNP 577 Labs (12/16); K 4, creatinine 1.3, normal rheumatoid factor and scleroderma serologies Labs (1/17): ANA positive but only 1:80 titer, dsDNA negative, SSA/B negative, CCP negative.  Labs  (2/17): K 4.1, creatinine 1.48 => 1.42, BNP 487 Labs (3/17): K 4.2, creatinine 1.58, BNP 326 Labs (7/17): K 3.3, creatinine 1.7, hgb 10.7 Labs (8/17): K 3.9, creatinine 1.87, HCT 35.6 Labs (1/18): hgb 9.9, creatinine 1.42 Labs (4/18): TSH normal, hgb 10.5, K 3.8, creatinine 1.34 Labs (6/18): BNP 415, K 4.9, creatinine 1.45 Labs (1/19): K 4.4, creatinine 1.56, hgb 11.3 Labs (7/19): K 4, creatinine 1.5, BNP 381 Labs (1/20): K 3.8, creatinine 1.38 Labs (11/20): K 3.8, creatinine 1.59 Labs (5/21): K 3.8, creatinine 1.4, myeloma panel negative, urine immunofixation negative Labs (8/21): BNP 319, hgb 10.2, K 4.4, creatinine 1.7 Labs (5/22): LDL 87, TSH normal, hgb 12.6, K 3.9, creatinine 1.26 Labs (11/22): LDL 72 Labs (12/22): K 3.7, creatinine 1.59 Labs (1/23): K 3.7, creatinine 1.30   PMH: 1. Cardiolite (9/16) with EF 54%, no ischemia/infarction. 2. Chronic diastolic CHF: Echo ([id number]) with EF 55-60%, normal RV size and systolic function, PA systolic pressure 66 mmHg.  - Echo (7/17) with EF 60-65%, mildly dilated RV with mildly decreased systolic function, PA systolic pressure 36 mmHg.  - Echo (7/18) with EF 55-60%, mild to moderate AI, normal RV size and systolic function, PASP 43 mmHg.  - Echo (1/20) with EF 55-60%, normal RV size and systolic function, mild AI, mild MR.  - Echo (5/21) witih EF 60-65%, mild LVH, PASP 33 mmHg, normal RV, mild MR, mild-moderate AS with mean gradient 14 mmHg, AVA 1.23 cm^2.  - PYP scan (5/21): grade 0, H/CL 1.1 (not suggestive of TTR amyloidosis).  - Echo (5/22): EF 55-60%, mildly decreased RV systolic function, PASP 47, mild MR, mild AS mean gradient 10 mmHg. 3. Chronic atrial fibrillation: She has been intolerant of Multaq and propafenone.  Sotalol and flecainide have not been used due to CKD.   4. SBO: Managed conservatively. 5. CKD stage 3 6. OA: h/o THR and TKR.  7. H/o hyponatremia 8. Pneumonia with parapneumonic effusion with thoracenteses and  eventual VATS and decortication.  9. Pulmonary hypertension: Suspect Group 1 pulmonary hypertension.  RHC (12/16) with mean RA 5, PA 62/22 mean 40, mean PCWP 19, CI 2.46, PVR 5 WU.  PFTs (12/16) wiith FVC 71%, FEV1 73%, ratio 104%, TLC 71%, DLCO 53% => moderate restriction concerning for interstitial lung disease.  High resolution CT (12/16) did not show evidence for interstitial lung disease.  V/Q scan (12/16) did not show evidence for chronic PE.  Serologies: RF negative, scleroderma antibodies negative, ANA positive but only 1:80 titer, SSA/B negative, dsDNA negative, CCP negative.  She did not tolerate macitentan or ambrisentan.  Echo [id number] with PA systolic pressure down to 36 mmHg.   - 6 minute walk (12/16) with 244 meters - 6 minute walk (2/17) with 232 meters - 6 minute walk (3/17) with 414 meters - 6 minute walk (4/17) with 267 meters - 6 minute walk (8/17) with 241 meters - 6 minute walk (10/17) with 256 meters -  6 minute walk (6/18) with 305 meters - 6 minute walk (9/18) with 320 meters - 6 minute walk (1/19) with 213 meters - 6 minute walk (7/19) with 335 meters - 6 minute walk (1/20) with 213 meters - 6 minute walk (10/20) with 243 meters - 6 minute walk (2/21) with 243 meters - 6 minute walk (8/21) with 214 meters - 6 minute walk (2/22) with 260 meters - 6 minute walk (7/22) with 274 meters 10. Lightheadedness/syncope: Extensive workup.  Event and holter monitors as well as telemetry monitoring negative.  She has not been orthostatic.  - Did not tolerate pyridostigmine due to hot flashes.  11. Sciatica: Status post back surgery 1/18.  12. COVID-19 infection 12/20.  13. Peripheral neuropathy 14. Aortic stenosis: Mild to moderate on 5/21 echo.  15. Breast cancer: Triple negative on left, s/p mastectomy in 7/21.  16. CVA: 11/22, occurred while off anticoagulation.  - Carotid dopplers (11/22) showed 1-39% BICA stenosis.     Current Outpatient Medications  Medication Sig Dispense  Refill   amoxicillin (AMOXIL) 500 MG capsule TAKE 4 CAPSULES BY MOUTH ONE HOUR PRIOR TO DENTAL APPOINTMENT     gabapentin (NEURONTIN) 300 MG capsule Take 300 mg by mouth daily.     ipratropium-albuterol (DUONEB) 0.5-2.5 (3) MG/3ML SOLN USE 1 VIAL IN NEBULIZER EVERY 6 HOURS - And As Needed     metoprolol succinate (TOPROL-XL) 25 MG 24 hr tablet Take 12.5 mg by mouth at bedtime.     potassium chloride (MICRO-K) 10 MEQ CR capsule Take 2 capsules (20 mEq total) by mouth daily. 60 capsule 4   Prenatal Vit-Fe Psac Cmplx-FA (POLY IRON PN FORTE PO) Take 1 tablet by mouth daily.     Rivaroxaban (XARELTO) 15 MG TABS tablet Take 1 tablet (15 mg total) by mouth daily with supper. 90 tablet 3   sildenafil (REVATIO) 20 MG tablet Take 4 tablets (80 mg total) by mouth 3 (three) times daily. 360 tablet 11   torsemide (DEMADEX) 20 MG tablet Take 1 tablet (20 mg total) by mouth daily. 30 tablet 11   UPTRAVI 1000 MCG TABS Take 1 tablet by mouth 2 (two) times daily. 180 tablet 3   No current facility-administered medications for this encounter.   Allergies:   Acetaminophen, Hydrocodone-acetaminophen, Letairis [ambrisentan], Opsumit [macitentan], Ace inhibitors, Atenolol, Hctz [hydrochlorothiazide], Oxycodone, and Diltiazem hcl   Social History:  The patient  reports that she has never smoked. She has never used smokeless tobacco. She reports that she does not drink alcohol and does not use drugs.   Family History:  The patient's family history includes Cancer in her mother.   ROS:  Please see the history of present illness.   All other systems are personally reviewed and negative.   Exam:   BP (!) 172/84    Pulse 78    Wt 63.1 kg (139 lb 3.2 oz)    SpO2 96%    BMI 21.80 kg/m  General:  NAD. No resp difficulty HEENT: Normal Neck: Supple. JVP 7-8. Carotids 2+ bilat; no bruits. No lymphadenopathy or thryomegaly appreciated. Cor: PMI nondisplaced. Irregular rate & rhythm. No rubs, gallops, 2/6 SEM RUSB Lungs:  Clear Abdomen: Soft, nontender, nondistended. No hepatosplenomegaly. No bruits or masses. Good bowel sounds. Extremities: No cyanosis, clubbing, rash, 1+ RLE pre-tibial edema  Neuro: Alert & oriented x 3, cranial nerves grossly intact. Moves all 4 extremities w/o difficulty. Affect pleasant.  Recent Labs: 07/29/2020: Hemoglobin 12.6; Platelets 210 01/13/2021: B Natriuretic Peptide 444.2; BUN  20; Creatinine, Ser 1.44; Potassium 3.4; Sodium 137  Personally reviewed   Wt Readings from Last 3 Encounters:  02/09/21 63.1 kg (139 lb 3.2 oz)  01/13/21 64.7 kg (142 lb 9.6 oz)  07/29/20 61.8 kg (136 lb 3.2 oz)     ASSESSMENT AND PLAN: 1. Chronic diastolic CHF:  She has improved NYHA class II symptoms but remains mildly volume overloaded on exam. ReDs 46%, weight down 3 lbs, likely has 4-5 more lbs of fluid to go. - Increase torsemide back to 40 mg daily x 5 days then back to 20  mg daily. Recent SCr 1.30 and K 3.7 on labs from 02/02/21. BMET/BNP in 10 days.   - Continue KCL 20 daily. - Continue compression stockings.  2. Pulmonary hypertension: Moderate to severe pulmonary hypertension by echo (PASP 66 mmHg). RHC showed moderate pulmonary arterial hypertension with PVR 5 WU.  PFTs with restrictive changes suggestive of interstitial process but high resolution CT did not show interstitial lung disease.  No evidence for chronic PE on V/Q scan.  Rheumatologic serologies all negative.  Possible group 1 pulmonary hypertension.  She was unable to tolerate macitentan or ambrisentan.  Insurance would not approve [place], site.  She is now on sildenafil 80 mg tid and selexipag 1000 mcg bid, unable to increase selexipag further due to symptoms.  Echo in 5/22 showed mildly decreased RV systolic function and PASP 47 mmHg.  - 6 minute walk next visit. - Continue Selexipag at 1000 mcg bid, unable to tolerate uptitration of Selexipag any further.  - Continue sildenafil 80 mg tid.   3. Pulmonary: s/p PNA with parapneumonic  effusion and eventual VATS with decortication.  As above, restrictive PFTs but no ILD on high resolution CT.  CT in 4/20 showed a concerning RLL nodule and emphysema, though she never smoked. Her pulmonologist ordered a PET scan after this per her report, and PET was normal.  4. Atrial fibrillation: Chronic. Following rate control/anticoagulation strategy.   - Continue Toprol XL. - Continue Xarelto 15 mg daily.  5. HTN: She has tended to have marked BP fluctuations as well as orthostatic hypotension.  24 hour BP monitor in 5/22 showed average BP 152/73. SBP 170 in clinic however it was 129 on her home check. Will allow BP to run a little high to avoid orthostatic symptoms and falls.  - She did not tolerate amlodipine due to orthostatic symptoms.   - Continue to wear compression stockings.  - Continue Toprol XL 12.5 mg at bedtime given very high BP when she first gets up in the morning.  6. CKD: Stage 3.  Recent BMET ok,  K 3.7, SCr 1.3 7. Aortic stenosis: Mild on 5/22 echo.  8. CVA: She had a stroke in 11/22 while off Xarelto.  Carotid dopplers were unremarkable.  Her stroke was atrial fibrillation-related.  She has been referred to a neurologist. She needs to stay on Xarelto if at all possible.   Followup in 6 weeks with APP (recheck fluid & BP) and in 3 months with Dr. Aundra Dubin.   She needs to continue followup in this clinic as there is no one in [place] to manage her pulmonary hypertension medications.  Her daughter is able to get her here.   Signed, Rafael Bihari, FNP  02/09/2021  [facility] [address] [facility] [place] [address] [phone] (office) [phone] (fax)

## 2021-02-09 ENCOUNTER — Other Ambulatory Visit (HOSPITAL_COMMUNITY): Payer: Self-pay | Admitting: Cardiology

## 2021-02-09 ENCOUNTER — Other Ambulatory Visit: Payer: Self-pay

## 2021-02-09 ENCOUNTER — Encounter (HOSPITAL_COMMUNITY): Payer: Self-pay

## 2021-02-09 ENCOUNTER — Ambulatory Visit (HOSPITAL_COMMUNITY)
Admission: RE | Admit: 2021-02-09 | Discharge: 2021-02-09 | Disposition: A | Discharge status: Home / Self Care | Payer: Medicare Other | Source: Ambulatory Visit | Attending: Family Medicine | Admitting: Family Medicine

## 2021-02-09 VITALS — BP 172/84 | HR 78 | Wt 139.2 lb

## 2021-02-09 DIAGNOSIS — Z79899 Other long term (current) drug therapy: Secondary | ICD-10-CM | POA: Diagnosis not present

## 2021-02-09 DIAGNOSIS — M7989 Other specified soft tissue disorders: Secondary | ICD-10-CM | POA: Insufficient documentation

## 2021-02-09 DIAGNOSIS — R0609 Other forms of dyspnea: Secondary | ICD-10-CM | POA: Insufficient documentation

## 2021-02-09 DIAGNOSIS — Z853 Personal history of malignant neoplasm of breast: Secondary | ICD-10-CM | POA: Diagnosis not present

## 2021-02-09 DIAGNOSIS — I951 Orthostatic hypotension: Secondary | ICD-10-CM | POA: Insufficient documentation

## 2021-02-09 DIAGNOSIS — I272 Pulmonary hypertension, unspecified: Secondary | ICD-10-CM

## 2021-02-09 DIAGNOSIS — Z8673 Personal history of transient ischemic attack (TIA), and cerebral infarction without residual deficits: Secondary | ICD-10-CM

## 2021-02-09 DIAGNOSIS — I998 Other disorder of circulatory system: Secondary | ICD-10-CM

## 2021-02-09 DIAGNOSIS — I130 Hypertensive heart and chronic kidney disease with heart failure and stage 1 through stage 4 chronic kidney disease, or unspecified chronic kidney disease: Secondary | ICD-10-CM | POA: Diagnosis present

## 2021-02-09 DIAGNOSIS — Z9012 Acquired absence of left breast and nipple: Secondary | ICD-10-CM | POA: Insufficient documentation

## 2021-02-09 DIAGNOSIS — Z8616 Personal history of COVID-19: Secondary | ICD-10-CM | POA: Diagnosis not present

## 2021-02-09 DIAGNOSIS — I2721 Secondary pulmonary arterial hypertension: Secondary | ICD-10-CM | POA: Diagnosis not present

## 2021-02-09 DIAGNOSIS — I350 Nonrheumatic aortic (valve) stenosis: Secondary | ICD-10-CM

## 2021-02-09 DIAGNOSIS — I10 Essential (primary) hypertension: Secondary | ICD-10-CM

## 2021-02-09 DIAGNOSIS — Z9181 History of falling: Secondary | ICD-10-CM | POA: Insufficient documentation

## 2021-02-09 DIAGNOSIS — N183 Chronic kidney disease, stage 3 unspecified: Secondary | ICD-10-CM | POA: Diagnosis not present

## 2021-02-09 DIAGNOSIS — I5032 Chronic diastolic (congestive) heart failure: Secondary | ICD-10-CM | POA: Diagnosis not present

## 2021-02-09 DIAGNOSIS — J439 Emphysema, unspecified: Secondary | ICD-10-CM | POA: Diagnosis not present

## 2021-02-09 DIAGNOSIS — Z8701 Personal history of pneumonia (recurrent): Secondary | ICD-10-CM | POA: Insufficient documentation

## 2021-02-09 DIAGNOSIS — I482 Chronic atrial fibrillation, unspecified: Secondary | ICD-10-CM

## 2021-02-09 DIAGNOSIS — Z7901 Long term (current) use of anticoagulants: Secondary | ICD-10-CM | POA: Insufficient documentation

## 2021-02-09 DIAGNOSIS — J90 Pleural effusion, not elsewhere classified: Secondary | ICD-10-CM | POA: Insufficient documentation

## 2021-02-09 NOTE — Progress Notes (Signed)
ReDS Vest / Clip - 02/09/21 0940       ReDS Vest / Clip   Station Marker C    Ruler Value 25    ReDS Value Range High volume overload    ReDS Actual Value 46    Anatomical Comments sitting

## 2021-02-09 NOTE — Patient Instructions (Addendum)
Thank you for coming in today  Your physician recommends that you return for lab work in:  please have labs drawn in 10 days and fax to office [phone]  INCREASE Torsemide to 40 mg 2 tablets daily for 5 days only then return to 20 mg 1 tablet daily   Your physician recommends that you schedule a follow-up appointment in: 6 weeks on clinic 3 months with Dr. Shirlee Latch  At the [facility], you and your health needs are our priority. As part of our continuing mission to provide you with exceptional heart care, we have created designated Provider Care Teams. These Care Teams include your primary Cardiologist (physician) and Advanced Practice Providers (APPs- Physician Assistants and Nurse Practitioners) who all work together to provide you with the care you need, when you need it.   You may see any of the following providers on your designated Care Team at your next follow up: Dr Arvilla Meres Dr Carron Curie, NP Robbie Lis, [place] [facility], [place] Karle Plumber, PharmD   Please be sure to bring in all your medications bottles to every appointment.   If you have any questions or concerns before your next appointment please send [place] a message through [place] or call our office at [phone].    TO LEAVE A MESSAGE FOR THE NURSE SELECT OPTION 2, PLEASE LEAVE A MESSAGE INCLUDING: YOUR NAME DATE OF BIRTH CALL BACK NUMBER REASON FOR CALL**this is important as we prioritize the call backs  YOU WILL RECEIVE A CALL BACK THE SAME DAY AS LONG AS YOU CALL BEFORE 4:00 PM

## 2021-02-16 ENCOUNTER — Other Ambulatory Visit (HOSPITAL_COMMUNITY): Payer: Self-pay | Admitting: Cardiology

## 2021-02-23 ENCOUNTER — Other Ambulatory Visit (HOSPITAL_COMMUNITY): Payer: Self-pay

## 2021-02-23 MED ORDER — UPTRAVI 1000 MCG PO TABS: 1.0000 | ORAL_TABLET | Freq: Two times a day (BID) | ORAL | 3 refills | Status: DC

## 2021-03-06 ENCOUNTER — Other Ambulatory Visit (HOSPITAL_COMMUNITY): Payer: Self-pay | Admitting: Cardiology

## 2021-03-23 NOTE — Progress Notes (Signed)
?  ? ?[facility] Note ? ? ?Date:  03/24/2021  ? ?ID:  Rachael Myers, DOB 1930/01/03, MRN [id number]  ?Provider location: [place] [facility] ?Type of Visit: Established patient ? ?PCP:  Myrtis Hopping., MD  ?HF Cardiologist:  Dr. Aundra Dubin ?  ?HPI: ?Rachael Myers is a 88 y.o. female who has a history of chronic diastolic CHF, chronic atrial fibrillation, and prominent exertional dyspnea.  She has had atrial fibrillation long-term and has failed Multaq and propafenone use.  She is in atrial fibrillation chronically now.  In 1/16, she developed PNA.  She had a parapneumonic effusion that required 4 thoracenteses.  Eventually, she had VATS with decortication and a wedge resection.  Cytology was negative from the pleural fluid.  Last chest CT was at [facility] in 8/16 and showed no PE, mild interstitial edema.  Last echo was done in 11/16, showing normal LV EF and apparently normal RV EF.  PA systolic pressure estimation was moderate to severely increased.  I took her for [place] in 12/16, this showed moderate pulmonary arterial hypertension.  PFTs showed a restrictive pattern concerning for interstitial lung disease.  V/Q scan in 12/16 was not suggestive of chronic PE.  Despite abnormal PFTs, high resolution CT did not show interstitial lung disease.   ?  ?At a prior appointment, started her on Opsumit.  She developed a cough and mouth soreness with this medication and had to stop it after about 7 days.  Symptoms resolved when she stopped it.  Tried to get Adcirca for her.  This was denied by her insurance company, so she was started on Revatio 20 mg tid instead.  She thinks that this has helped her breathing "a little."  She saw a pulmonary specialist who thought dyspnea was likely due to [facility].  Next, tried her on ambrisentan, but she developed facial swelling on ambrisentan (significant around the eyes).  She also developed worsening dyspnea.  She stopped ambrisentan and the swelling  resolved but she was noted to be volume overloaded.  Switched her Lasix to torsemide.  She was started on selexipag which she has tolerated.  ?  ?She had a difficult winter with episode of PNA in 2/20.  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 There was moderate emphysema though she never smoked.  She says that her pulmonologist in [facility] followed up on her CT with a PET scan that was normal.  ? ?She had a COVID-19 infection in 12/20 but appears to have recovered well.  ? ?Echo in 5/21 showed EF 60-65%, mild LVH, PASP 33 mmHg, normal RV, mild MR, mild-moderate AS with mean gradient 14 mmHg, AVA 1.23 cm^2.  PYP scan in 5/21 was not suggestive of TTR amyloidosis.  ? ?[name] and had mastectomy (no chemo or radiation).  ? ?Echo in 5/22 showed EF 55-60%, mildly decreased RV systolic function, PASP 47, mild MR, mild AS mean gradient 10 mmHg.  ? ?She had a fall in 11/22 at home, seems to have been mechanical.  She developed a scalp hematoma and bruising on her arm.  Xarelto was held.  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  Xarelto was restarted.  She was sent to rehab for 3 wks and was not given her torsemide.   ? ?Follow up 2/23, she remained mildly volume up, ReDs 46%. She was instructed  to increase torsemide to 40 mg daily x 5 days then back to 20 mg daily. ? ?Today she returns for HF follow up. More SOB x 3 weeks, more dyspnea with ADLs and walking on flat ground. She walks with a walker. She has stopped her gabapentin as she was told it could cause LE swelling. Eating more salt in diet recently.  Denies abnormal bleeding, CP, dizziness, or PND/Orthopnea. Appetite ok. No fever or chills. Weight at home 140 pounds. Taking all medications.  ? ?ReDs 44% ? ?ECG (personally reviewed): atrial fibrillation 72 bpm. ? ?Labs (9/16): K 4, creatinine 1.31, BNP  466 ?Labs (11/16): BNP 577 ?Labs (12/16); K 4, creatinine 1.3, normal rheumatoid factor and scleroderma serologies ?Labs (1/17): ANA positive but only 1:80 titer, dsDNA negative, SSA/B negative, CCP negative.  ?Labs (2/17): K 4.1, creatinine 1.48 => 1.42, BNP 487 ?Labs (3/17): K 4.2, creatinine 1.58, BNP 326 ?Labs (7/17): K 3.3, creatinine 1.7, hgb 10.7 ?Labs (8/17): K 3.9, creatinine 1.87, HCT 35.6 ?Labs (1/18): hgb 9.9, creatinine 1.42 ?Labs (4/18): TSH normal, hgb 10.5, K 3.8, creatinine 1.34 ?Labs (6/18): BNP 415, K 4.9, creatinine 1.45 ?Labs (1/19): K 4.4, creatinine 1.56, hgb 11.3 ?Labs (7/19): K 4, creatinine 1.5, BNP 381 ?Labs (1/20): K 3.8, creatinine 1.38 ?Labs (11/20): K 3.8, creatinine 1.59 ?Labs (5/21): K 3.8, creatinine 1.4, myeloma panel negative, urine immunofixation negative ?Labs (8/21): BNP 319, hgb 10.2, K 4.4, creatinine 1.7 ?Labs (5/22): LDL 87, TSH normal, hgb 12.6, K 3.9, creatinine 1.26 ?Labs (11/22): LDL 72 ?Labs (12/22): K 3.7, creatinine 1.59 ?Labs (1/23): K 3.7, creatinine 1.30 ?Labs (3/23): K 3.6, creatinine 1.33 ?  ?PMH: ?1. Cardiolite (9/16) with EF 54%, no ischemia/infarction. ?2. Chronic diastolic CHF: Echo ([id number]) with EF 55-60%, normal RV size and systolic function, PA systolic pressure 66 mmHg.  ?- Echo (7/17) with EF 60-65%, mildly dilated RV with mildly decreased systolic function, PA systolic pressure 36 mmHg.  ?- Echo (7/18) with EF 55-60%, mild to moderate AI, normal RV size and systolic function, PASP 43 mmHg.  ?- Echo (1/20) with EF 55-60%, normal RV size and systolic function, mild AI, mild MR.  ?- Echo (5/21) witih EF 60-65%, mild LVH, PASP 33 mmHg, normal RV, mild MR, mild-moderate AS with mean gradient 14 mmHg, AVA 1.23 cm^2.  ?- PYP scan (5/21): grade 0, H/CL 1.1 (not suggestive of TTR amyloidosis).  ?- Echo (5/22): EF 55-60%, mildly decreased RV systolic function, PASP 47, mild MR, mild AS mean gradient 10 mmHg. ?3. Chronic atrial fibrillation: She has been  intolerant of Multaq and propafenone.  Sotalol and flecainide have not been used due to CKD.   ?4. SBO: Managed conservatively. ?5. CKD stage 3 ?6. OA: h/o THR and TKR.  ?7. H/o hyponatremia ?8. Pneumonia with parapneumonic effusion with thoracenteses and eventual VATS and decortication.  ?9. Pulmonary hypertension: Suspect Group 1 pulmonary hypertension.  RHC (12/16) with mean RA 5, PA 62/22 mean 40, mean PCWP 19, CI 2.46, PVR 5 WU.  PFTs (12/16) wiith FVC 71%, FEV1 73%, ratio 104%, TLC 71%, DLCO 53% => moderate restriction concerning for interstitial lung disease.  High resolution CT (12/16) did not show evidence for interstitial lung disease.  V/Q scan (12/16) did not show evidence for chronic PE.  Serologies: RF negative, scleroderma antibodies negative, ANA positive but only 1:80 titer, SSA/B negative, dsDNA negative, CCP negative.  She did not tolerate macitentan or ambrisentan.  Echo [id number] with PA systolic pressure down to 36 mmHg.   ?-  6 minute walk (12/16) with 244 meters ?- 6 minute walk (2/17) with 232 meters ?- 6 minute walk (3/17) with 414 meters ?- 6 minute walk (4/17) with 267 meters ?- 6 minute walk (8/17) with 241 meters ?- 6 minute walk (10/17) with 256 meters ?- 6 minute walk (6/18) with 305 meters ?- 6 minute walk (9/18) with 320 meters ?- 6 minute walk (1/19) with 213 meters ?- 6 minute walk (7/19) with 335 meters ?- 6 minute walk (1/20) with 213 meters ?- 6 minute walk (10/20) with 243 meters ?- 6 minute walk (2/21) with 243 meters ?- 6 minute walk (8/21) with 214 meters ?- 6 minute walk (2/22) with 260 meters ?- 6 minute walk (7/22) with 274 meters ?10. Lightheadedness/syncope: Extensive workup.  Event and holter monitors as well as telemetry monitoring negative.  She has not been orthostatic.  ?- Did not tolerate pyridostigmine due to hot flashes.  ?11. Sciatica: Status post back surgery 1/18.  ?12. COVID-19 infection 12/20.  ?13. Peripheral neuropathy ?14. Aortic stenosis: Mild to moderate on  5/21 echo.  ?15. Breast cancer: Triple negative on left, s/p mastectomy in 7/21.  ?16. CVA: 11/22, occurred while off anticoagulation.  ?- Carotid dopplers (11/22) showed 1-39% BICA stenosis.  ?  ?Current Outpatient Med

## 2021-03-24 ENCOUNTER — Ambulatory Visit (HOSPITAL_COMMUNITY)
Admission: RE | Admit: 2021-03-24 | Discharge: 2021-03-24 | Disposition: A | Discharge status: Home / Self Care | Payer: Medicare Other | Source: Ambulatory Visit | Attending: Family Medicine | Admitting: Family Medicine

## 2021-03-24 ENCOUNTER — Other Ambulatory Visit (HOSPITAL_COMMUNITY): Payer: Self-pay | Admitting: Cardiology

## 2021-03-24 ENCOUNTER — Encounter (HOSPITAL_COMMUNITY): Payer: Self-pay

## 2021-03-24 ENCOUNTER — Other Ambulatory Visit: Payer: Self-pay

## 2021-03-24 VITALS — BP 142/84 | HR 83 | Wt 140.4 lb

## 2021-03-24 DIAGNOSIS — N183 Chronic kidney disease, stage 3 unspecified: Secondary | ICD-10-CM

## 2021-03-24 DIAGNOSIS — I2721 Secondary pulmonary arterial hypertension: Secondary | ICD-10-CM | POA: Diagnosis not present

## 2021-03-24 DIAGNOSIS — I951 Orthostatic hypotension: Secondary | ICD-10-CM | POA: Insufficient documentation

## 2021-03-24 DIAGNOSIS — I5032 Chronic diastolic (congestive) heart failure: Secondary | ICD-10-CM | POA: Diagnosis present

## 2021-03-24 DIAGNOSIS — I272 Pulmonary hypertension, unspecified: Secondary | ICD-10-CM | POA: Diagnosis not present

## 2021-03-24 DIAGNOSIS — I130 Hypertensive heart and chronic kidney disease with heart failure and stage 1 through stage 4 chronic kidney disease, or unspecified chronic kidney disease: Secondary | ICD-10-CM | POA: Diagnosis not present

## 2021-03-24 DIAGNOSIS — Z8673 Personal history of transient ischemic attack (TIA), and cerebral infarction without residual deficits: Secondary | ICD-10-CM

## 2021-03-24 DIAGNOSIS — I350 Nonrheumatic aortic (valve) stenosis: Secondary | ICD-10-CM

## 2021-03-24 DIAGNOSIS — Z7901 Long term (current) use of anticoagulants: Secondary | ICD-10-CM | POA: Diagnosis not present

## 2021-03-24 DIAGNOSIS — I10 Essential (primary) hypertension: Secondary | ICD-10-CM | POA: Diagnosis not present

## 2021-03-24 DIAGNOSIS — R0609 Other forms of dyspnea: Secondary | ICD-10-CM | POA: Diagnosis not present

## 2021-03-24 DIAGNOSIS — I482 Chronic atrial fibrillation, unspecified: Secondary | ICD-10-CM

## 2021-03-24 DIAGNOSIS — I998 Other disorder of circulatory system: Secondary | ICD-10-CM

## 2021-03-24 MED ORDER — TORSEMIDE 20 MG PO TABS: 40.0000 mg | ORAL_TABLET | Freq: Every day | ORAL | 11 refills | Status: DC

## 2021-03-24 NOTE — Patient Instructions (Signed)
INCREASE Torsemide to 40 mg (2 tabs) daily ? ?Please have labs done at PCP appointment next week ? ? ?Keep cardiology follow up as scheduled with Dr Aundra Dubin ? ?Do the following things EVERYDAY: ?Weigh yourself in the morning before breakfast. Write it down and keep it in a log. ?Take your medicines as prescribed ?Eat low salt foods--Limit salt (sodium) to 2000 mg per day.  ?Stay as active as you can everyday ?Limit all fluids for the day to less than 2 liters ? ? ?At the [facility], you and your health needs are our priority. As part of our continuing mission to provide you with exceptional heart care, we have created designated Provider Care Teams. These Care Teams include your primary Cardiologist (physician) and Advanced Practice Providers (APPs- Physician Assistants and Nurse Practitioners) who all work together to provide you with the care you need, when you need it.  ? ?You may see any of the following providers on your designated Care Team at your next follow up: ?Dr Glori Bickers ?Dr Loralie Champagne ?Darrick Grinder, NP ?Lyda Jester, PA ?Jessica Milford,NP ?Marlyce Huge, PA ?Audry Riles, PharmD ? ? ?Please be sure to bring in all your medications bottles to every appointment.  ? ?If you have any questions or concerns before your next appointment please send [place] a message through [place] or call our office at [phone].   ? ?TO LEAVE A MESSAGE FOR THE NURSE SELECT OPTION 2, PLEASE LEAVE A MESSAGE INCLUDING: ?YOUR NAME ?DATE OF BIRTH ?CALL BACK NUMBER ?REASON FOR CALL**this is important as we prioritize the call backs ? ?YOU WILL RECEIVE A CALL BACK THE SAME DAY AS LONG AS YOU CALL BEFORE 4:00 PM ? ?

## 2021-03-24 NOTE — Progress Notes (Signed)
ReDS Vest / Clip - 03/24/21 1100   ? ?  ? ReDS Vest / Clip  ? Station Marker C   ? Ruler Value 22   ? ReDS Value Range High volume overload   ? ReDS Actual Value 44   ? ?  ?  ? ?  ? ? ?

## 2021-05-04 ENCOUNTER — Other Ambulatory Visit (HOSPITAL_COMMUNITY): Payer: Self-pay | Admitting: Cardiology

## 2021-05-12 ENCOUNTER — Ambulatory Visit (HOSPITAL_COMMUNITY)
Admission: RE | Admit: 2021-05-12 | Discharge: 2021-05-12 | Disposition: A | Discharge status: Home / Self Care | Payer: Medicare Other | Source: Ambulatory Visit | Attending: Cardiology | Admitting: Cardiology

## 2021-05-12 ENCOUNTER — Encounter (HOSPITAL_COMMUNITY): Payer: Self-pay | Admitting: Cardiology

## 2021-05-12 VITALS — BP 180/80 | HR 88 | Wt 137.8 lb

## 2021-05-12 DIAGNOSIS — Z79899 Other long term (current) drug therapy: Secondary | ICD-10-CM | POA: Diagnosis not present

## 2021-05-12 DIAGNOSIS — I5032 Chronic diastolic (congestive) heart failure: Secondary | ICD-10-CM

## 2021-05-12 DIAGNOSIS — I482 Chronic atrial fibrillation, unspecified: Secondary | ICD-10-CM

## 2021-05-12 DIAGNOSIS — I130 Hypertensive heart and chronic kidney disease with heart failure and stage 1 through stage 4 chronic kidney disease, or unspecified chronic kidney disease: Secondary | ICD-10-CM | POA: Diagnosis not present

## 2021-05-12 DIAGNOSIS — I272 Pulmonary hypertension, unspecified: Secondary | ICD-10-CM

## 2021-05-12 DIAGNOSIS — Z7901 Long term (current) use of anticoagulants: Secondary | ICD-10-CM | POA: Insufficient documentation

## 2021-05-12 DIAGNOSIS — I951 Orthostatic hypotension: Secondary | ICD-10-CM | POA: Insufficient documentation

## 2021-05-12 DIAGNOSIS — N183 Chronic kidney disease, stage 3 unspecified: Secondary | ICD-10-CM

## 2021-05-12 DIAGNOSIS — Z8616 Personal history of COVID-19: Secondary | ICD-10-CM | POA: Insufficient documentation

## 2021-05-12 DIAGNOSIS — Z8673 Personal history of transient ischemic attack (TIA), and cerebral infarction without residual deficits: Secondary | ICD-10-CM | POA: Insufficient documentation

## 2021-05-12 DIAGNOSIS — I2721 Secondary pulmonary arterial hypertension: Secondary | ICD-10-CM | POA: Diagnosis not present

## 2021-05-12 DIAGNOSIS — I350 Nonrheumatic aortic (valve) stenosis: Secondary | ICD-10-CM | POA: Diagnosis not present

## 2021-05-12 LAB — BASIC METABOLIC PANEL
Anion gap: 9 (ref 5–15)
BUN: 21 mg/dL (ref 8–23)
CO2: 24 mmol/L (ref 22–32)
Calcium: 9.8 mg/dL (ref 8.9–10.3)
Chloride: 103 mmol/L (ref 98–111)
Creatinine, Ser: 1.45 mg/dL — ABNORMAL HIGH (ref 0.44–1.00)
GFR, Estimated: 34 mL/min — ABNORMAL LOW (ref >60)
Glucose, Bld: 92 mg/dL (ref 70–99)
Potassium: 3.6 mmol/L (ref 3.5–5.1)
Sodium: 136 mmol/L (ref 135–145)

## 2021-05-12 LAB — BRAIN NATRIURETIC PEPTIDE: B Natriuretic Peptide: 350.6 pg/mL — ABNORMAL HIGH (ref 0.0–100.0)

## 2021-05-12 MED ORDER — TORSEMIDE 20 MG PO TABS: ORAL_TABLET | ORAL | 6 refills | Status: DC

## 2021-05-12 MED ORDER — ATORVASTATIN CALCIUM 80 MG PO TABS: 40.0000 mg | ORAL_TABLET | Freq: Every day | ORAL | 3 refills | Status: DC

## 2021-05-12 MED ORDER — POTASSIUM CHLORIDE ER 10 MEQ PO TBCR: 20.0000 meq | EXTENDED_RELEASE_TABLET | Freq: Every day | ORAL | 6 refills | Status: DC

## 2021-05-12 NOTE — Patient Instructions (Signed)
Medication Changes: ? ?Increase potassium to (2Tabs) daily ? ?Change Torsemide to 40 mg (2Tabs) daily for 3 days then 40 mg (2 Tab) alternating with 20 mg (1Tab) daily ? ?Increase Atorvastatin to 80 mg daily  ? ?Lab Work: ? ?Labs done today, your results will be available in MyChart, we will contact you for abnormal readings. ? ? ?Testing/Procedures: ? ?Repeat blood work in 10 days  ? ?Referrals: ? ?none ? ?Special Instructions // Education: ? ?none ? ?Follow-Up in: 3 weeks  ? ?At the [facility], you and your health needs are our priority. We have a designated team specialized in the treatment of Heart Failure. This Care Team includes your primary Heart Failure Specialized Cardiologist (physician), Advanced Practice Providers (APPs- Physician Assistants and Nurse Practitioners), and Pharmacist who all work together to provide you with the care you need, when you need it.  ? ?You may see any of the following providers on your designated Care Team at your next follow up: ? ?Dr Arvilla Meres ?Dr [name] ?Tonye Becket, NP ?Robbie Lis, PA ?Jessica Milford,NP ?Anna Genre, PA ?Karle Plumber, PharmD ? ? ?Please be sure to bring in all your medications bottles to every appointment.  ? ?Need to Contact us: ? ?If you have any questions or concerns before your next appointment please send [place] a message through [place] or call our office at [phone].   ? ?TO LEAVE A MESSAGE FOR THE NURSE SELECT OPTION 2, PLEASE LEAVE A MESSAGE INCLUDING: ?YOUR NAME ?DATE OF BIRTH ?CALL BACK NUMBER ?REASON FOR CALL**this is important as we prioritize the call backs ? ?YOU WILL RECEIVE A CALL BACK THE SAME DAY AS LONG AS YOU CALL BEFORE 4:00 PM ? ? ?

## 2021-05-12 NOTE — Progress Notes (Signed)
?  ? ?[facility] Note ? ? ?Date:  05/12/2021  ? ?ID:  ANIESHA HINKS, DOB 12-24-1929, MRN [id number]  ?Provider location: [facility] ?Type of Visit: Established patient ? ?PCP:  Myrtis Hopping., MD  ?HF Cardiologist:  Dr. Aundra Dubin ?  ?HPI: ?KALAIYAH WINEMAN is a 88 y.o. female who has a history of chronic diastolic CHF, chronic atrial fibrillation, and prominent exertional dyspnea.  She has had atrial fibrillation long-term and has failed Multaq and propafenone use.  She is in atrial fibrillation chronically now.  In 1/16, she developed PNA.  She had a parapneumonic effusion that required 4 thoracenteses.  Eventually, she had VATS with decortication and a wedge resection.  Cytology was negative from the pleural fluid.  Last chest CT was at [facility] in 8/16 and showed no PE, mild interstitial edema.  Last echo was done in 11/16, showing normal LV EF and apparently normal RV EF.  PA systolic pressure estimation was moderate to severely increased.  I took her for [place] in 12/16, this showed moderate pulmonary arterial hypertension.  PFTs showed a restrictive pattern concerning for interstitial lung disease.  V/Q scan in 12/16 was not suggestive of chronic PE.  Despite abnormal PFTs, high resolution CT did not show interstitial lung disease.   ?  ?At a prior appointment, started her on Opsumit.  She developed a cough and mouth soreness with this medication and had to stop it after about 7 days.  Symptoms resolved when she stopped it.  Tried to get Adcirca for her.  This was denied by her insurance company, so she was started on Revatio 20 mg tid instead.  She thinks that this has helped her breathing "a little."  She saw a pulmonary specialist who thought dyspnea was likely due to [facility].  Next, tried her on ambrisentan, but she developed facial swelling on ambrisentan (significant around the eyes).  She also developed worsening dyspnea.  She stopped ambrisentan and the swelling  resolved but she was noted to be volume overloaded.  Switched her Lasix to torsemide.  She was started on selexipag which she has tolerated.  ?  ?She had a difficult winter with episode of PNA in 2/20.  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 There was moderate emphysema though she never smoked.  She says that her pulmonologist in [place] followed up on her CT with a PET scan that was normal.  ? ?She had a COVID-19 infection in 12/20 but appears to have recovered well.  ? ?Echo in 5/21 showed EF 60-65%, mild LVH, PASP 33 mmHg, normal RV, mild MR, mild-moderate AS with mean gradient 14 mmHg, AVA 1.23 cm^2.  PYP scan in 5/21 was not suggestive of TTR amyloidosis.  ? ?[name] and had mastectomy (no chemo or radiation).  ? ?Echo in 5/22 showed EF 55-60%, mildly decreased RV systolic function, PASP 47, mild MR, mild AS mean gradient 10 mmHg.  ? ?She had a fall in 11/22 at home, seems to have been mechanical.  She developed a scalp hematoma and bruising on her arm.  Xarelto was held.  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  Xarelto was restarted.  She was sent to rehab for 3 wks and was not given her torsemide.   ? ?Patient was admitted twice at the hospital in [facility] in 4/23, once  with diastolic CHF and once with ?TIA.  Workup was negative for recurrent CVA.  Echo in 4/23 showed EF 55-60%, mild LVH, mild RV dilation with decreased systolic function, mild AS with mean gradient 15 mmHg, PASP 33 mmHg. ? ?Today she returns for HF follow up. She is taking torsemide 20 mg daily.  She feels "bloated" and short of breath.  She is dyspneic just walking around her house.  No orthopnea/PND.  No chest pain. Her BP tends to fluctuate significantly.  It is high today, 180/80.  She brings readings from home, with SBP ranging from 100s-160s, SBP seems to average around  130. She gets lightheaded when SBP < 110.  No falls or syncope.  ? ?Labs (9/16): K 4, creatinine 1.31, BNP 466 ?Labs (11/16): BNP 577 ?Labs (12/16); K 4, creatinine 1.3, normal rheumatoid factor and scleroderma serologies ?Labs (1/17): ANA positive but only 1:80 titer, dsDNA negative, SSA/B negative, CCP negative.  ?Labs (2/17): K 4.1, creatinine 1.48 => 1.42, BNP 487 ?Labs (3/17): K 4.2, creatinine 1.58, BNP 326 ?Labs (7/17): K 3.3, creatinine 1.7, hgb 10.7 ?Labs (8/17): K 3.9, creatinine 1.87, HCT 35.6 ?Labs (1/18): hgb 9.9, creatinine 1.42 ?Labs (4/18): TSH normal, hgb 10.5, K 3.8, creatinine 1.34 ?Labs (6/18): BNP 415, K 4.9, creatinine 1.45 ?Labs (1/19): K 4.4, creatinine 1.56, hgb 11.3 ?Labs (7/19): K 4, creatinine 1.5, BNP 381 ?Labs (1/20): K 3.8, creatinine 1.38 ?Labs (11/20): K 3.8, creatinine 1.59 ?Labs (5/21): K 3.8, creatinine 1.4, myeloma panel negative, urine immunofixation negative ?Labs (8/21): BNP 319, hgb 10.2, K 4.4, creatinine 1.7 ?Labs (5/22): LDL 87, TSH normal, hgb 12.6, K 3.9, creatinine 1.26 ?Labs (11/22): LDL 72 ?Labs (12/22): K 3.7, creatinine 1.59 ?Labs (1/23): K 3.7, creatinine 1.30 ?Labs (3/23): K 3.6, creatinine 1.33 ?Labs (4/23): K 4, creatinine 1.31, LDL 76, HDL 45 ?  ?PMH: ?1. Cardiolite (9/16) with EF 54%, no ischemia/infarction. ?2. Chronic diastolic CHF: Echo ([id number]) with EF 55-60%, normal RV size and systolic function, PA systolic pressure 66 mmHg.  ?- Echo (7/17) with EF 60-65%, mildly dilated RV with mildly decreased systolic function, PA systolic pressure 36 mmHg.  ?- Echo (7/18) with EF 55-60%, mild to moderate AI, normal RV size and systolic function, PASP 43 mmHg.  ?- Echo (1/20) with EF 55-60%, normal RV size and systolic function, mild AI, mild MR.  ?- Echo (5/21) witih EF 60-65%, mild LVH, PASP 33 mmHg, normal RV, mild MR, mild-moderate AS with mean gradient 14 mmHg, AVA 1.23 cm^2.  ?- PYP scan (5/21): grade 0, H/CL 1.1 (not suggestive of TTR amyloidosis).  ?- Echo  (5/22): EF 55-60%, mildly decreased RV systolic function, PASP 47, mild MR, mild AS mean gradient 10 mmHg. ?- Echo (4/23): EF 55-60%, mild LVH, mild RV dilation with decreased systolic function, mild AS with mean gradient 15 mmHg, PASP 33 mmHg. ?3. Chronic atrial fibrillation: She has been intolerant of Multaq and propafenone.  Sotalol and flecainide have not been used due to CKD.   ?4. SBO: Managed conservatively. ?5. CKD stage 3 ?6. OA: h/o THR and TKR.  ?7. H/o hyponatremia ?8. Pneumonia with parapneumonic effusion with thoracenteses and eventual VATS and decortication.  ?9. Pulmonary hypertension: Suspect Group 1 pulmonary hypertension.  RHC (12/16) with mean RA 5, PA 62/22 mean 40, mean PCWP 19, CI 2.46, PVR 5 WU.  PFTs (12/16) wiith FVC 71%, FEV1 73%, ratio 104%, TLC 71%, DLCO 53% => moderate restriction concerning for interstitial lung disease.  High resolution CT (12/16) did not  show evidence for interstitial lung disease.  V/Q scan (12/16) did not show evidence for chronic PE.  Serologies: RF negative, scleroderma antibodies negative, ANA positive but only 1:80 titer, SSA/B negative, dsDNA negative, CCP negative.  She did not tolerate macitentan or ambrisentan.  Echo [id number] with PA systolic pressure down to 36 mmHg.   ?- 6 minute walk (12/16) with 244 meters ?- 6 minute walk (2/17) with 232 meters ?- 6 minute walk (3/17) with 414 meters ?- 6 minute walk (4/17) with 267 meters ?- 6 minute walk (8/17) with 241 meters ?- 6 minute walk (10/17) with 256 meters ?- 6 minute walk (6/18) with 305 meters ?- 6 minute walk (9/18) with 320 meters ?- 6 minute walk (1/19) with 213 meters ?- 6 minute walk (7/19) with 335 meters ?- 6 minute walk (1/20) with 213 meters ?- 6 minute walk (10/20) with 243 meters ?- 6 minute walk (2/21) with 243 meters ?- 6 minute walk (8/21) with 214 meters ?- 6 minute walk (2/22) with 260 meters ?- 6 minute walk (7/22) with 274 meters ?10. Lightheadedness/syncope: Extensive workup.  Event and  holter monitors as well as telemetry monitoring negative.  She has not been orthostatic.  ?- Did not tolerate pyridostigmine due to hot flashes.  ?11. Sciatica: Status post back surgery 1/18.  ?12. COVID-19 infect

## 2021-05-13 ENCOUNTER — Telehealth (HOSPITAL_COMMUNITY): Payer: Self-pay | Admitting: *Deleted

## 2021-05-13 NOTE — Telephone Encounter (Signed)
Pt called to clarify torsemide dose. Discussed AVS with pt.  ?

## 2021-06-02 ENCOUNTER — Encounter (HOSPITAL_COMMUNITY): Payer: Self-pay

## 2021-06-02 ENCOUNTER — Other Ambulatory Visit (HOSPITAL_COMMUNITY): Payer: Self-pay

## 2021-06-02 ENCOUNTER — Ambulatory Visit (HOSPITAL_COMMUNITY)
Admission: RE | Admit: 2021-06-02 | Discharge: 2021-06-02 | Disposition: A | Discharge status: Home / Self Care | Payer: Medicare Other | Source: Ambulatory Visit | Attending: Family Medicine | Admitting: Family Medicine

## 2021-06-02 VITALS — BP 176/84 | HR 89 | Wt 138.6 lb

## 2021-06-02 DIAGNOSIS — Z8616 Personal history of COVID-19: Secondary | ICD-10-CM | POA: Insufficient documentation

## 2021-06-02 DIAGNOSIS — I5032 Chronic diastolic (congestive) heart failure: Secondary | ICD-10-CM | POA: Diagnosis present

## 2021-06-02 DIAGNOSIS — I10 Essential (primary) hypertension: Secondary | ICD-10-CM | POA: Diagnosis not present

## 2021-06-02 DIAGNOSIS — N183 Chronic kidney disease, stage 3 unspecified: Secondary | ICD-10-CM | POA: Insufficient documentation

## 2021-06-02 DIAGNOSIS — Z8673 Personal history of transient ischemic attack (TIA), and cerebral infarction without residual deficits: Secondary | ICD-10-CM | POA: Insufficient documentation

## 2021-06-02 DIAGNOSIS — I130 Hypertensive heart and chronic kidney disease with heart failure and stage 1 through stage 4 chronic kidney disease, or unspecified chronic kidney disease: Secondary | ICD-10-CM | POA: Diagnosis not present

## 2021-06-02 DIAGNOSIS — I350 Nonrheumatic aortic (valve) stenosis: Secondary | ICD-10-CM | POA: Insufficient documentation

## 2021-06-02 DIAGNOSIS — Z79899 Other long term (current) drug therapy: Secondary | ICD-10-CM | POA: Insufficient documentation

## 2021-06-02 DIAGNOSIS — I482 Chronic atrial fibrillation, unspecified: Secondary | ICD-10-CM | POA: Diagnosis not present

## 2021-06-02 DIAGNOSIS — I272 Pulmonary hypertension, unspecified: Secondary | ICD-10-CM | POA: Diagnosis not present

## 2021-06-02 DIAGNOSIS — Z7901 Long term (current) use of anticoagulants: Secondary | ICD-10-CM | POA: Diagnosis not present

## 2021-06-02 LAB — BASIC METABOLIC PANEL
Anion gap: 10 (ref 5–15)
BUN: 26 mg/dL — ABNORMAL HIGH (ref 8–23)
CO2: 24 mmol/L (ref 22–32)
Calcium: 9.4 mg/dL (ref 8.9–10.3)
Chloride: 103 mmol/L (ref 98–111)
Creatinine, Ser: 1.73 mg/dL — ABNORMAL HIGH (ref 0.44–1.00)
GFR, Estimated: 28 mL/min — ABNORMAL LOW (ref >60)
Glucose, Bld: 98 mg/dL (ref 70–99)
Potassium: 3.8 mmol/L (ref 3.5–5.1)
Sodium: 137 mmol/L (ref 135–145)

## 2021-06-02 LAB — BRAIN NATRIURETIC PEPTIDE: B Natriuretic Peptide: 298.8 pg/mL — ABNORMAL HIGH (ref 0.0–100.0)

## 2021-06-02 MED ORDER — EMPAGLIFLOZIN 10 MG PO TABS: 10.0000 mg | ORAL_TABLET | Freq: Every day | ORAL | 11 refills | Status: DC

## 2021-06-02 NOTE — Progress Notes (Signed)
[facility] Note   Date:  06/02/2021   ID:  [name], DOB 1929-07-02, MRN [id number]  Provider location: [place] [facility] Type of Visit: Established patient  PCP:  Rachael Myers., MD  HF Cardiologist:  Dr. Aundra Myers   HPI: Rachael Myers is a 88 y.o. female who has a history of chronic diastolic CHF, chronic atrial fibrillation, and prominent exertional dyspnea.  She has had atrial fibrillation long-term and has failed Multaq and propafenone use.  She is in atrial fibrillation chronically now.  In 1/16, she developed PNA.  She had a parapneumonic effusion that required 4 thoracenteses.  Eventually, she had VATS with decortication and a wedge resection.  Cytology was negative from the pleural fluid.  Last chest CT was at [facility] in 8/16 and showed no PE, mild interstitial edema.  Last echo was done in 11/16, showing normal LV EF and apparently normal RV EF.  PA systolic pressure estimation was moderate to severely increased.  I took her for [place] in 12/16, this showed moderate pulmonary arterial hypertension.  PFTs showed a restrictive pattern concerning for interstitial lung disease.  V/Q scan in 12/16 was not suggestive of chronic PE.  Despite abnormal PFTs, high resolution CT did not show interstitial lung disease.     At a prior appointment, started her on Opsumit.  She developed a cough and mouth soreness with this medication and had to stop it after about 7 days.  Symptoms resolved when she stopped it.  Tried to get Adcirca for her.  This was denied by her insurance company, so she was started on Revatio 20 mg tid instead.  She thinks that this has helped her breathing "a little."  She saw a pulmonary specialist who thought dyspnea was likely due to [facility].  Next, tried her on ambrisentan, but she developed facial swelling on ambrisentan (significant around the eyes).  She also developed worsening dyspnea.  She stopped ambrisentan and the swelling  resolved but she was noted to be volume overloaded.  Switched her Lasix to torsemide.  She was started on selexipag which she has tolerated.    She had a difficult winter with episode of PNA in 2/20.  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 There was moderate emphysema though she never smoked.  She says that her pulmonologist in [facility] followed up on her CT with a PET scan that was normal.   She had a COVID-19 infection in 12/20 but appears to have recovered well.   Echo in 5/21 showed EF 60-65%, mild LVH, PASP 33 mmHg, normal RV, mild MR, mild-moderate AS with mean gradient 14 mmHg, AVA 1.23 cm^2.  PYP scan in 5/21 was not suggestive of TTR amyloidosis.   [name] and had mastectomy (no chemo or radiation).   Echo in 5/22 showed EF 55-60%, mildly decreased RV systolic function, PASP 47, mild MR, mild AS mean gradient 10 mmHg.   She had a fall in 11/22 at home, seems to have been mechanical.  She developed a scalp hematoma and bruising on her arm.  Xarelto was held.  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  Xarelto was restarted.  She was sent to rehab for 3 wks and was not given her torsemide.    Patient was admitted twice at the hospital in [facility] in 4/23, once  with diastolic CHF and once with ?TIA.  Workup was negative for recurrent CVA.  Echo in 4/23 showed EF 55-60%, mild LVH, mild RV dilation with decreased systolic function, mild AS with mean gradient 15 mmHg, PASP 33 mmHg.  Follow up 5/23, she was volume overloaded. Lightheaded when SBP <110. Instructed to increase torsemide to 40 mg daily x 3 days then 20 mg daily alternating with 40 mg every other day.  Today she returns for HF follow up with her daughter. Overall feeling "terrible". She misunderstood previous torsemide instructions and did not increase her dose as  discussed. She is more short of breath with minimal activity. Abdominal bloating still present. Denies palpitations, CP, dizziness, edema, or PND/Orthopnea. Appetite ok. No fever or chills. Weight at home 130-135 pounds. Taking all medications. Eats a lot of take out.  REDs: 50%  Labs (9/16): K 4, creatinine 1.31, BNP 466 Labs (11/16): BNP 577 Labs (12/16); K 4, creatinine 1.3, normal rheumatoid factor and scleroderma serologies Labs (1/17): ANA positive but only 1:80 titer, dsDNA negative, SSA/B negative, CCP negative.  Labs (2/17): K 4.1, creatinine 1.48 => 1.42, BNP 487 Labs (3/17): K 4.2, creatinine 1.58, BNP 326 Labs (7/17): K 3.3, creatinine 1.7, hgb 10.7 Labs (8/17): K 3.9, creatinine 1.87, HCT 35.6 Labs (1/18): hgb 9.9, creatinine 1.42 Labs (4/18): TSH normal, hgb 10.5, K 3.8, creatinine 1.34 Labs (6/18): BNP 415, K 4.9, creatinine 1.45 Labs (1/19): K 4.4, creatinine 1.56, hgb 11.3 Labs (7/19): K 4, creatinine 1.5, BNP 381 Labs (1/20): K 3.8, creatinine 1.38 Labs (11/20): K 3.8, creatinine 1.59 Labs (5/21): K 3.8, creatinine 1.4, myeloma panel negative, urine immunofixation negative Labs (8/21): BNP 319, hgb 10.2, K 4.4, creatinine 1.7 Labs (5/22): LDL 87, TSH normal, hgb 12.6, K 3.9, creatinine 1.26 Labs (11/22): LDL 72 Labs (12/22): K 3.7, creatinine 1.59 Labs (1/23): K 3.7, creatinine 1.30 Labs (3/23): K 3.6, creatinine 1.33 Labs (4/23): K 4, creatinine 1.31, LDL 76, HDL 45 Labs (5/23): K 3.6, creatinine 1.45   PMH: 1. Cardiolite (9/16) with EF 54%, no ischemia/infarction. 2. Chronic diastolic CHF: Echo ([id number]) with EF 55-60%, normal RV size and systolic function, PA systolic pressure 66 mmHg.  - Echo (7/17) with EF 60-65%, mildly dilated RV with mildly decreased systolic function, PA systolic pressure 36 mmHg.  - Echo (7/18) with EF 55-60%, mild to moderate AI, normal RV size and systolic function, PASP 43 mmHg.  - Echo (1/20) with EF 55-60%, normal RV size and systolic  function, mild AI, mild MR.  - Echo (5/21) witih EF 60-65%, mild LVH, PASP 33 mmHg, normal RV, mild MR, mild-moderate AS with mean gradient 14 mmHg, AVA 1.23 cm^2.  - PYP scan (5/21): grade 0, H/CL 1.1 (not suggestive of TTR amyloidosis).  - Echo (5/22): EF 55-60%, mildly decreased RV systolic function, PASP 47, mild MR, mild AS mean gradient 10 mmHg. - Echo (4/23): EF 55-60%, mild LVH, mild RV dilation with decreased systolic function, mild AS with mean gradient 15 mmHg, PASP 33 mmHg. 3. Chronic atrial fibrillation: She has been intolerant of Multaq and propafenone.  Sotalol and flecainide have not been used due to CKD.   4. SBO: Managed conservatively. 5. CKD stage 3 6. OA: h/o THR and TKR.  7. H/o hyponatremia 8. Pneumonia with parapneumonic effusion with thoracenteses and eventual VATS and decortication.  9. Pulmonary hypertension: Suspect Group 1 pulmonary hypertension.  RHC (12/16) with mean RA 5, PA 62/22 mean 40, mean PCWP 19, CI 2.46, PVR 5 WU.  PFTs (12/16) wiith FVC 71%, FEV1 73%, ratio 104%, TLC 71%, DLCO 53% => moderate restriction concerning for interstitial lung disease.  High resolution CT (12/16) did not show evidence for interstitial lung disease.  V/Q scan (12/16) did not show evidence for chronic PE.  Serologies: RF negative, scleroderma antibodies negative, ANA positive but only 1:80 titer, SSA/B negative, dsDNA negative, CCP negative.  She did not tolerate macitentan or ambrisentan.  Echo [id number] with PA systolic pressure down to 36 mmHg.   - 6 minute walk (12/16) with 244 meters - 6 minute walk (2/17) with 232 meters - 6 minute walk (3/17) with 414 meters - 6 minute walk (4/17) with 267 meters - 6 minute walk (8/17) with 241 meters - 6 minute walk (10/17) with 256 meters - 6 minute walk (6/18) with 305 meters - 6 minute walk (9/18) with 320 meters - 6 minute walk (1/19) with 213 meters - 6 minute walk (7/19) with 335 meters - 6 minute walk (1/20) with 213 meters - 6 minute  walk (10/20) with 243 meters - 6 minute walk (2/21) with 243 meters - 6 minute walk (8/21) with 214 meters - 6 minute walk (2/22) with 260 meters - 6 minute walk (7/22) with 274 meters 10. Lightheadedness/syncope: Extensive workup.  Event and holter monitors as well as telemetry monitoring negative.  She has not been orthostatic.  - Did not tolerate pyridostigmine due to hot flashes.  11. Sciatica: Status post back surgery 1/18.  12. COVID-19 infection 12/20.  13. Peripheral neuropathy 14. Aortic stenosis: Mild to moderate on 5/21 echo.  - Mild on 4/23 echo.  15. Breast cancer: Triple negative on left, s/p mastectomy in 7/21.  16. CVA: 11/22, occurred while off anticoagulation.  - Carotid dopplers (11/22) showed 1-39% BICA stenosis.    Current Outpatient Medications  Medication Sig Dispense Refill   aspirin 81 MG chewable tablet Chew 81 mg by mouth daily.     atorvastatin (LIPITOR) 80 MG tablet Take 0.5 tablets (40 mg total) by mouth daily. 60 tablet 3   ipratropium-albuterol (DUONEB) 0.5-2.5 (3) MG/3ML SOLN USE 1 VIAL IN NEBULIZER EVERY 6 HOURS - And As Needed     metoprolol succinate (TOPROL-XL) 25 MG 24 hr tablet TAKE 1 TABLET AT BEDTIME (CHANGE IN DOSE OR TABLET SIZE) 90 tablet 3   potassium chloride (KLOR-CON) 10 MEQ tablet Take 2 tablets (20 mEq total) by mouth daily. 60 tablet 6   PREDNISOLONE ACETATE OP Apply 1 drop to eye in the morning, at noon, in the evening, and at bedtime. Left eye     REVATIO 20 MG tablet TAKE 4 TABLETS THREE TIMES A DAY 1080 tablet 3   torsemide (DEMADEX) 20 MG tablet Patient takes 1 and 1/2 tablet by mouth daily.     UPTRAVI 1000 MCG TABS Take 1 tablet by mouth 2 (two) times daily. 180 tablet 3   XARELTO 15 MG TABS tablet TAKE 1 TABLET DAILY WITH SUPPER 90 tablet 3   amoxicillin (AMOXIL) 500 MG capsule TAKE 4 CAPSULES BY MOUTH ONE HOUR PRIOR TO DENTAL APPOINTMENT (Patient not taking: Reported on 06/02/2021)     No current facility-administered  medications for this encounter.   Allergies:   Acetaminophen, Hydrocodone-acetaminophen, Letairis [ambrisentan], Opsumit [macitentan], Ace inhibitors, Atenolol, Hctz [hydrochlorothiazide], Oxycodone, and Diltiazem hcl   Social History:  The patient  reports that she has never smoked. She has never used smokeless tobacco. She reports that she does not drink alcohol and does not use drugs.  Family History:  The patient's family history includes Cancer in her mother.   ROS:  Please see the history of present illness.   All other systems are personally reviewed and negative.   Recent Labs: 07/29/2020: Hemoglobin 12.6; Platelets 210 05/12/2021: B Natriuretic Peptide 350.6; BUN 21; Creatinine, Ser 1.45; Potassium 3.6; Sodium 136  Personally reviewed   Wt Readings from Last 3 Encounters:  06/02/21 62.9 kg (138 lb 9.6 oz)  05/12/21 62.5 kg (137 lb 12.8 oz)  03/24/21 63.7 kg (140 lb 6.4 oz)   BP (!) 176/84   Pulse 89   Wt 62.9 kg (138 lb 9.6 oz)   SpO2 91%   BMI 21.71 kg/m   Physical Exam:   General:  NAD. No resp difficulty, arrived in [facility] HEENT: Normal Neck: Supple. JVP 7-8. Carotids 2+ bilat; no bruits. No lymphadenopathy or thryomegaly appreciated. Cor: PMI nondisplaced. Irregular rate & rhythm. No rubs, gallops, 3/6 SEM RUSB with audible S2 Lungs: Faint crackles LLL Abdomen: Soft, nontender, nondistended. No hepatosplenomegaly. No bruits or masses. Good bowel sounds. Extremities: No cyanosis, clubbing, rash, edema Neuro: Alert & oriented x 3, cranial nerves grossly intact. Moves all 4 extremities w/o difficulty. Affect pleasant.  Assessment & Plan: 1. Chronic diastolic CHF:  Most recent echo at [facility] in 4/23 showed EF 55-60%, mild LVH, mild RV dilation with decreased systolic function, mild AS with mean gradient 15 mmHg, PASP 33 mmHg.  On exam today, she is volume overloaded with NYHA class III-IIIb symptoms.  ReDs 50%. - Increase torsemide to 40 mg bid + extra 20 KCL x  3 days, then torsemide 40 mg daily. BMET/BNP today. - Start Jardiance 10 mg daily. We discussed possible side effects.  - Continue compression stockings.  2. Pulmonary hypertension: Moderate to severe pulmonary hypertension by echo (PASP 66 mmHg). RHC showed moderate pulmonary arterial hypertension with PVR 5 WU.  PFTs with restrictive changes suggestive of interstitial process but high resolution CT did not show interstitial lung disease.  No evidence for chronic PE on V/Q scan.  Rheumatologic serologies all negative.  Possible group 1 pulmonary hypertension.  She was unable to tolerate macitentan or ambrisentan.  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  She is now on sildenafil 80 mg tid and selexipag 1000 mcg bid, unable to increase selexipag further due to symptoms.  Echo in 4/23 showed mildly decreased RV systolic function and PASP 33 mmHg.  - Defer 6 minute walk today with volume overload.  - Continue Selexipag at 1000 mcg bid, unable to tolerate uptitration of Selexipag any further.  - Continue sildenafil 80 mg tid.   3. Pulmonary: s/p PNA with parapneumonic effusion and eventual VATS with decortication.  As above, restrictive PFTs but no ILD on high resolution CT.  CT in 4/20 showed a concerning RLL nodule and emphysema, though she never smoked. Her pulmonologist ordered a PET scan after this per her report, and PET was normal.  4. Atrial fibrillation: Chronic. Following rate control/anticoagulation strategy.   - Continue Toprol XL. - Continue Xarelto 15 mg daily.  5. HTN: She has tended to have marked BP fluctuations as well as orthostatic hypotension.  24 hour BP monitor in 5/22 showed average BP 152/73. Would allow BP to run a little high to avoid orthostatic symptoms and falls.  This is not ideal with stroke history, but her average SBP is 130-140 based on the readings she brought today. - I see where she has been referred to a nephrologist for BP management.  Would be very careful before  starting antihypertensives given orthostatic history and fall risk.  - Continue to wear compression stockings.  - Continue Toprol XL 25 mg at bedtime.  6. CKD: Stage 3.  BMET today.  7. Aortic stenosis: Mild on 4/23 echo.  8. CVA: She had a stroke in 11/22 while off Xarelto.  Carotid dopplers were unremarkable.  Her stroke was atrial fibrillation-related.  She has been referred to a neurologist. She needs to stay on Xarelto if at all possible.  - Goal LDL < 70. Continue atorvastatin 80 mg daily (recently increased), check lipids/LFTs in 2 months.   Follow up in 4 weeks with APP (check fluid) and 3 months with Dr. Aundra Myers.  Signed, Rafael Bihari, FNP  06/02/2021  [facility] [address] [facility] [place] [address] [phone] (office) [phone] (fax)

## 2021-06-02 NOTE — Progress Notes (Signed)
ReDS Vest / Clip - 06/02/21 1100       ReDS Vest / Clip   Station Marker C    Ruler Value 22.5    ReDS Value Range High volume overload    ReDS Actual Value 50

## 2021-06-02 NOTE — Patient Instructions (Addendum)
START Jardiance 10 mg, one tab daily  INCREASE Torsemide to 40 mg twice a day for 3 days, then resume normal dose of 40 mg daily thereafter  INCREASE Potassium to 20 meq twice a day for 3 days, then resume normal dose of 20 meq daily thereafter  Labs today We will only contact you if something comes back abnormal or we need to make some changes. Otherwise no news is good news!  Labs needed in 7-10 days  Your physician recommends that you schedule a follow-up appointment in: 4 weeks  in the [facility] Pract[facility] (PA/NP) Clinic and in 3 months with Dr Shirlee Latch   Do the following things EVERYDAY: Weigh yourself in the morning before breakfast. Write it down and keep it in a log. Take your medicines as prescribed Eat low salt foods--Limit salt (sodium) to 2000 mg per day.  Stay as active as you can everyday Limit all fluids for the day to less than 2 liters  At the [facility], you and your health needs are our priority. As part of our continuing mission to provide you with exceptional heart care, we have created designated Provider Care Teams. These Care Teams include your primary Cardiologist (physician) and Advanced Practice Providers (APPs- Physician Assistants and Nurse Practitioners) who all work together to provide you with the care you need, when you need it.   You may see any of the following providers on your designated Care Team at your next follow up: Dr Arvilla Meres Dr Carron Curie, NP Robbie Lis, [place] [facility] [place], [place] Karle Plumber, PharmD   Please be sure to bring in all your medications bottles to every appointment.

## 2021-06-03 ENCOUNTER — Other Ambulatory Visit (HOSPITAL_COMMUNITY): Payer: Self-pay | Admitting: Cardiology

## 2021-06-11 ENCOUNTER — Other Ambulatory Visit (HOSPITAL_COMMUNITY): Payer: Self-pay | Admitting: Cardiology

## 2021-06-17 ENCOUNTER — Other Ambulatory Visit (HOSPITAL_COMMUNITY): Payer: Self-pay | Admitting: Cardiology

## 2021-07-01 NOTE — Progress Notes (Signed)
[facility] Note   Date:  07/02/2021   ID:  Rachael Myers, DOB [birth year], MRN [id number]  Provider location: [facility] Type of Visit: Established patient  PCP:  Myrtis Hopping., MD  HF Cardiologist:  Dr. Aundra Dubin   HPI: Rachael Myers is a 88 y.o. female who has a history of chronic diastolic CHF, chronic atrial fibrillation, and prominent exertional dyspnea.  She has had atrial fibrillation long-term and has failed Multaq and propafenone use.  She is in atrial fibrillation chronically now.  In 1/16, she developed PNA.  She had a parapneumonic effusion that required 4 thoracenteses.  Eventually, she had VATS with decortication and a wedge resection.  Cytology was negative from the pleural fluid.  Chest CT at [facility] in 8/16 and showed no PE, mild interstitial edema.  Echo 11/16, showing normal LV EF and apparently normal RV EF.  PA systolic pressure estimation was moderate to severely increased.  [place] in 12/16, showed moderate pulmonary arterial hypertension.  PFTs showed a restrictive pattern concerning for interstitial lung disease.  V/Q scan in 12/16 was not suggestive of chronic PE.  Despite abnormal PFTs, high resolution CT did not show interstitial lung disease.     At a prior appointment, started her on Opsumit.  She developed a cough and mouth soreness with this medication and had to stop it after about 7 days.  Symptoms resolved when she stopped it.  Tried to get Adcirca for her.  This was denied by her insurance company, so she was started on Revatio 20 mg tid instead.  She thinks that this has helped her breathing "a little."  She saw a pulmonary specialist who thought dyspnea was likely due to [facility].  Next, tried her on ambrisentan, but she developed facial swelling on ambrisentan (significant around the eyes).  She also developed worsening dyspnea.  She stopped ambrisentan and the swelling resolved but she was noted to be volume  overloaded.  Switched her Lasix to torsemide.  She was started on selexipag which she has tolerated.    She had a difficult winter with episode of PNA in 2/20.  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 There was moderate emphysema though she never smoked.  She says that her pulmonologist in [facility] followed up on her CT with a PET scan that was normal.   She had a COVID-19 infection in 12/20 but appears to have recovered well.   Echo in 5/21 showed EF 60-65%, mild LVH, PASP 33 mmHg, normal RV, mild MR, mild-moderate AS with mean gradient 14 mmHg, AVA 1.23 cm^2.  PYP scan in 5/21 was not suggestive of TTR amyloidosis.   [name] and had mastectomy (no chemo or radiation).   Echo in 5/22 showed EF 55-60%, mildly decreased RV systolic function, PASP 47, mild MR, mild AS mean gradient 10 mmHg.   She had a fall in 11/22 at home, seems to have been mechanical.  She developed a scalp hematoma and bruising on her arm.  Xarelto was held.  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  Xarelto was restarted.  She was sent to rehab for 3 wks and was not given her torsemide.    Patient was admitted twice at the hospital in [facility] in 4/23, once with diastolic CHF and once with ?TIA.  Workup was negative  for recurrent CVA.  Echo in 4/23 showed EF 55-60%, mild LVH, mild RV dilation with decreased systolic function, mild AS with mean gradient 15 mmHg, PASP 33 mmHg.  Last seen for f/u 05/30. She was volume overloaded. ReDS 50%. Torsemide increased and Jardiance added to regimen.   She is here today for f/u. Reports she was unable to tolerate jardiance, reports it decreased her urine output. Her weight has been averaging between 134-136 lb. She is taking torsemide 30 mg daily, had been instructed to take 40 mg daily. Her abdomen feels distended and she is  short of breath with little exertion. Has occasional orthopnea. Mobility limited by b/l leg weakness. She stopped taking her statin a couple of weeks ago as she thought that may be contributing. Leg weakness has improved slightly.    Labs (9/16): K 4, creatinine 1.31, BNP 466 Labs (11/16): BNP 577 Labs (12/16); K 4, creatinine 1.3, normal rheumatoid factor and scleroderma serologies Labs (1/17): ANA positive but only 1:80 titer, dsDNA negative, SSA/B negative, CCP negative.  Labs (2/17): K 4.1, creatinine 1.48 => 1.42, BNP 487 Labs (3/17): K 4.2, creatinine 1.58, BNP 326 Labs (7/17): K 3.3, creatinine 1.7, hgb 10.7 Labs (8/17): K 3.9, creatinine 1.87, HCT 35.6 Labs (1/18): hgb 9.9, creatinine 1.42 Labs (4/18): TSH normal, hgb 10.5, K 3.8, creatinine 1.34 Labs (6/18): BNP 415, K 4.9, creatinine 1.45 Labs (1/19): K 4.4, creatinine 1.56, hgb 11.3 Labs (7/19): K 4, creatinine 1.5, BNP 381 Labs (1/20): K 3.8, creatinine 1.38 Labs (11/20): K 3.8, creatinine 1.59 Labs (5/21): K 3.8, creatinine 1.4, myeloma panel negative, urine immunofixation negative Labs (8/21): BNP 319, hgb 10.2, K 4.4, creatinine 1.7 Labs (5/22): LDL 87, TSH normal, hgb 12.6, K 3.9, creatinine 1.26 Labs (11/22): LDL 72 Labs (12/22): K 3.7, creatinine 1.59 Labs (1/23): K 3.7, creatinine 1.30 Labs (3/23): K 3.6, creatinine 1.33 Labs (4/23): K 4, creatinine 1.31, LDL 76, HDL 45   PMH: 1. Cardiolite (9/16) with EF 54%, no ischemia/infarction. 2. Chronic diastolic CHF: Echo (11/16) with EF 55-60%, normal RV size and systolic function, PA systolic pressure 66 mmHg.  - Echo (7/17) with EF 60-65%, mildly dilated RV with mildly decreased systolic function, PA systolic pressure 36 mmHg.  - Echo (7/18) with EF 55-60%, mild to moderate AI, normal RV size and systolic function, PASP 43 mmHg.  - Echo (1/20) with EF 55-60%, normal RV size and systolic function, mild AI, mild MR.  - Echo (5/21) witih EF 60-65%, mild LVH, PASP 33 mmHg,  normal RV, mild MR, mild-moderate AS with mean gradient 14 mmHg, AVA 1.23 cm^2.  - PYP scan (5/21): grade 0, H/CL 1.1 (not suggestive of TTR amyloidosis).  - Echo (5/22): EF 55-60%, mildly decreased RV systolic function, PASP 47, mild MR, mild AS mean gradient 10 mmHg. - Echo (4/23): EF 55-60%, mild LVH, mild RV dilation with decreased systolic function, mild AS with mean gradient 15 mmHg, PASP 33 mmHg. 3. Chronic atrial fibrillation: She has been intolerant of Multaq and propafenone.  Sotalol and flecainide have not been used due to CKD.   4. SBO: Managed conservatively. 5. CKD stage 3 6. OA: h/o THR and TKR.  7. H/o hyponatremia 8. Pneumonia with parapneumonic effusion with thoracenteses and eventual VATS and decortication.  9. Pulmonary hypertension: Suspect Group 1 pulmonary hypertension.  RHC (12/16) with mean RA 5, PA 62/22 mean 40, mean PCWP 19, CI 2.46, PVR 5 WU.  PFTs (12/16) wiith FVC 71%, FEV1 73%, ratio 104%, TLC 71%,  DLCO 53% => moderate restriction concerning for interstitial lung disease.  High resolution CT (12/16) did not show evidence for interstitial lung disease.  V/Q scan (12/16) did not show evidence for chronic PE.  Serologies: RF negative, scleroderma antibodies negative, ANA positive but only 1:80 titer, SSA/B negative, dsDNA negative, CCP negative.  She did not tolerate macitentan or ambrisentan.  Echo 7/17 with PA systolic pressure down to 36 mmHg.   - 6 minute walk (12/16) with 244 meters - 6 minute walk (2/17) with 232 meters - 6 minute walk (3/17) with 414 meters - 6 minute walk (4/17) with 267 meters - 6 minute walk (8/17) with 241 meters - 6 minute walk (10/17) with 256 meters - 6 minute walk (6/18) with 305 meters - 6 minute walk (9/18) with 320 meters - 6 minute walk (1/19) with 213 meters - 6 minute walk (7/19) with 335 meters - 6 minute walk (1/20) with 213 meters - 6 minute walk (10/20) with 243 meters - 6 minute walk (2/21) with 243 meters - 6 minute walk  (8/21) with 214 meters - 6 minute walk (2/22) with 260 meters - 6 minute walk (7/22) with 274 meters 10. Lightheadedness/syncope: Extensive workup.  Event and holter monitors as well as telemetry monitoring negative.  She has not been orthostatic.  - Did not tolerate pyridostigmine due to hot flashes.  11. Sciatica: Status post back surgery 1/18.  12. COVID-19 infection 12/20.  13. Peripheral neuropathy 14. Aortic stenosis: Mild to moderate on 5/21 echo.  - Mild on 4/23 echo.  15. Breast cancer: Triple negative on left, s/p mastectomy in 7/21.  16. CVA: 11/22, occurred while off anticoagulation.  - Carotid dopplers (11/22) showed 1-39% BICA stenosis.    Current Outpatient Medications  Medication Sig Dispense Refill   amoxicillin (AMOXIL) 500 MG capsule As needed before dental procedures     aspirin 81 MG chewable tablet Chew 81 mg by mouth daily.     metoprolol succinate (TOPROL-XL) 25 MG 24 hr tablet TAKE 1 TABLET AT BEDTIME (CHANGE IN DOSE OR TABLET SIZE) (Patient taking differently: Patient takes 1/2 tablet by mouth at bedtime.) 90 tablet 3   potassium chloride (KLOR-CON) 10 MEQ tablet Take 2 tablets (20 mEq total) by mouth daily. 60 tablet 6   PREDNISOLONE ACETATE OP Apply 1 drop to eye in the morning, at noon, in the evening, and at bedtime. Left eye     REVATIO 20 MG tablet TAKE 4 TABLETS THREE TIMES A DAY 1080 tablet 3   UPTRAVI 1000 MCG TABS Take 1 tablet by mouth 2 (two) times daily. 180 tablet 3   XARELTO 15 MG TABS tablet TAKE 1 TABLET DAILY WITH SUPPER 90 tablet 3   torsemide (DEMADEX) 20 MG tablet Take 2 tablets (40 mg total) by mouth daily. 60 tablet 5   No current facility-administered medications for this encounter.   Allergies:   Acetaminophen, Hydrocodone-acetaminophen, Letairis [ambrisentan], Opsumit [macitentan], Ace inhibitors, Atenolol, Hctz [hydrochlorothiazide], Oxycodone, and Diltiazem hcl   Social History:  The patient  reports that she has never smoked. She  has never used smokeless tobacco. She reports that she does not drink alcohol and does not use drugs.   Family History:  The patient's family history includes Cancer in her mother.   ROS:  Please see the history of present illness.   All other systems are personally reviewed and negative.   Recent Labs: 07/29/2020: Hemoglobin 12.6; Platelets 210 06/02/2021: B Natriuretic Peptide 298.8 07/02/2021: BUN 24; Creatinine,  Ser 1.46; Potassium 3.3; Sodium 135  Personally reviewed   Wt Readings from Last 3 Encounters:  07/02/21 62.1 kg (137 lb)  06/02/21 62.9 kg (138 lb 9.6 oz)  05/12/21 62.5 kg (137 lb 12.8 oz)   Physical Exam:   BP 134/66   Pulse 82   Wt 62.1 kg (137 lb)   SpO2 96%   BMI 21.46 kg/m  General:  Thin, elderly female. Arrived in wheelchair. HEENT: normal Neck: supple. JVP 10-12 cm. Carotids 2+ bilat; no bruits.  Cor: PMI nondisplaced. Irregular rate & rhythm. No rubs, gallops, 3/6 SEM RUSB Lungs: diminished in bases Abdomen: soft, nontender, nondistended.  Extremities: no cyanosis, clubbing, rash, 1+ edema, TED hose on Neuro: alert & orientedx3, cranial nerves grossly intact. moves all 4 extremities w/o difficulty. Affect pleasant   Assessment & Plan: 1. Chronic diastolic CHF:  Most recent echo at [facility] in 4/23 showed EF 55-60%, mild LVH, mild RV dilation with decreased systolic function, mild AS with mean gradient 15 mmHg, PASP 33 mmHg.   - NYHA III. Volume overloaded. Increase Torsemide to 60 mg BID X 2 days then reduce to 40 mg daily.  - Unable to tolerate jardiance. Reports it decreased urine output. - Continue compression stockings - BMET, BNP today 2. Pulmonary hypertension: Moderate to severe pulmonary hypertension by echo (PASP 66 mmHg). RHC showed moderate pulmonary arterial hypertension with PVR 5 WU.  PFTs with restrictive changes suggestive of interstitial process but high resolution CT did not show interstitial lung disease.  No evidence for  chronic PE on V/Q scan.  Rheumatologic serologies all negative.  Possible group 1 pulmonary hypertension.  She was unable to tolerate macitentan or ambrisentan.  Insurance would not approve Engineer, site.  She is now on sildenafil 80 mg tid and selexipag 1000 mcg bid, unable to increase selexipag further due to symptoms.  Echo in 4/23  showed mildly decreased RV systolic function and PASP 33 mmHg.  - Defer 6 minute walk today with volume overload and poor mobility status - Continue Selexipag at 1000 mcg bid, unable to tolerate uptitration of Selexipag any further.  - Continue sildenafil 80 mg tid.   3. Pulmonary: s/p PNA with parapneumonic effusion and eventual VATS with decortication.  As above, restrictive PFTs but no ILD on high resolution CT.  CT in 4/20 showed a concerning RLL nodule and emphysema, though she never smoked. Her pulmonologist ordered a PET scan after this per her report, and PET was normal.  4. Atrial fibrillation: Chronic. Following rate control/anticoagulation strategy.  - Continue Toprol XL. - Continue Xarelto 15 mg daily.  5. HTN: She has tended to have marked BP fluctuations as well as orthostatic hypotension.  24 hour BP monitor in 5/22 showed average BP 152/73. Home SBP variable between [id number] systolic, many readings between 120-130 - I see where she had been referred to a nephrologist for BP management.  Would be very careful before starting antihypertensives given orthostatic history and fall risk.  - Continue to wear compression stockings.  - Continue Toprol XL 25 mg at bedtime.  6. CKD: Stage 3b.  BMET today.  7. Aortic stenosis: Mild on 4/23 echo.  8. CVA: She had a stroke in 11/22 while off Xarelto.  Carotid dopplers were unremarkable.  Her stroke was atrial fibrillation-related.  She has been referred to a neurologist. She needs to stay on Xarelto if at all possible.  - She recently stopped atorvastatin d/t severe leg weakness. Symptoms have only modestly improved. She  wants to remain off statin therapy at this time.   Follow-up: 1 week with Dr. Aundra Dubin, patient requesting to see MD sooner than appt in 09/23  Signed, [facility], PA-C  07/02/2021  [facility] [place] [address] [phone] (office) [phone] (fax)

## 2021-07-02 ENCOUNTER — Encounter (HOSPITAL_COMMUNITY): Payer: Self-pay

## 2021-07-02 ENCOUNTER — Ambulatory Visit (HOSPITAL_COMMUNITY)
Admission: RE | Admit: 2021-07-02 | Discharge: 2021-07-02 | Disposition: A | Discharge status: Home / Self Care | Payer: Medicare Other | Source: Ambulatory Visit | Attending: Physician Assistant | Admitting: Physician Assistant

## 2021-07-02 VITALS — BP 134/66 | HR 82 | Wt 137.0 lb

## 2021-07-02 DIAGNOSIS — Z7982 Long term (current) use of aspirin: Secondary | ICD-10-CM | POA: Insufficient documentation

## 2021-07-02 DIAGNOSIS — I951 Orthostatic hypotension: Secondary | ICD-10-CM | POA: Diagnosis not present

## 2021-07-02 DIAGNOSIS — Z888 Allergy status to other drugs, medicaments and biological substances status: Secondary | ICD-10-CM | POA: Insufficient documentation

## 2021-07-02 DIAGNOSIS — I5032 Chronic diastolic (congestive) heart failure: Secondary | ICD-10-CM | POA: Insufficient documentation

## 2021-07-02 DIAGNOSIS — J439 Emphysema, unspecified: Secondary | ICD-10-CM | POA: Insufficient documentation

## 2021-07-02 DIAGNOSIS — Z8616 Personal history of COVID-19: Secondary | ICD-10-CM | POA: Diagnosis not present

## 2021-07-02 DIAGNOSIS — Z79899 Other long term (current) drug therapy: Secondary | ICD-10-CM | POA: Diagnosis not present

## 2021-07-02 DIAGNOSIS — I272 Pulmonary hypertension, unspecified: Secondary | ICD-10-CM | POA: Diagnosis not present

## 2021-07-02 DIAGNOSIS — Z853 Personal history of malignant neoplasm of breast: Secondary | ICD-10-CM | POA: Insufficient documentation

## 2021-07-02 DIAGNOSIS — N1832 Chronic kidney disease, stage 3b: Secondary | ICD-10-CM | POA: Diagnosis not present

## 2021-07-02 DIAGNOSIS — Z7901 Long term (current) use of anticoagulants: Secondary | ICD-10-CM | POA: Diagnosis not present

## 2021-07-02 DIAGNOSIS — I2721 Secondary pulmonary arterial hypertension: Secondary | ICD-10-CM | POA: Insufficient documentation

## 2021-07-02 DIAGNOSIS — I130 Hypertensive heart and chronic kidney disease with heart failure and stage 1 through stage 4 chronic kidney disease, or unspecified chronic kidney disease: Secondary | ICD-10-CM | POA: Insufficient documentation

## 2021-07-02 DIAGNOSIS — I69951 Hemiplegia and hemiparesis following unspecified cerebrovascular disease affecting right dominant side: Secondary | ICD-10-CM | POA: Diagnosis not present

## 2021-07-02 DIAGNOSIS — I482 Chronic atrial fibrillation, unspecified: Secondary | ICD-10-CM | POA: Diagnosis not present

## 2021-07-02 DIAGNOSIS — I350 Nonrheumatic aortic (valve) stenosis: Secondary | ICD-10-CM | POA: Diagnosis not present

## 2021-07-02 DIAGNOSIS — Z885 Allergy status to narcotic agent status: Secondary | ICD-10-CM | POA: Insufficient documentation

## 2021-07-02 LAB — BASIC METABOLIC PANEL
Anion gap: 9 (ref 5–15)
BUN: 24 mg/dL — ABNORMAL HIGH (ref 8–23)
CO2: 24 mmol/L (ref 22–32)
Calcium: 9 mg/dL (ref 8.9–10.3)
Chloride: 102 mmol/L (ref 98–111)
Creatinine, Ser: 1.46 mg/dL — ABNORMAL HIGH (ref 0.44–1.00)
GFR, Estimated: 34 mL/min — ABNORMAL LOW (ref >60)
Glucose, Bld: 103 mg/dL — ABNORMAL HIGH (ref 70–99)
Potassium: 3.3 mmol/L — ABNORMAL LOW (ref 3.5–5.1)
Sodium: 135 mmol/L (ref 135–145)

## 2021-07-02 LAB — BRAIN NATRIURETIC PEPTIDE: B Natriuretic Peptide: 390 pg/mL — ABNORMAL HIGH (ref 0.0–100.0)

## 2021-07-02 MED ORDER — TORSEMIDE 20 MG PO TABS
40.0000 mg | ORAL_TABLET | Freq: Every day | ORAL | 5 refills | Status: DC
Start: 2021-07-02 — End: 2021-07-08

## 2021-07-02 NOTE — Patient Instructions (Addendum)
Labs done today. We will contact you only if your labs are abnormal.  INCREASE Torsemide to 60mg  (3 tablets) by mouth daily for 2 days THEN DECREASE to 40mg  (2 tablets) by mouth daily.   No other medication changes were made. Please continue all current medications as prescribed.  Your physician recommends that you keep your scheduled follow-up appointment with Dr. in September.   If you have any questions or concerns before your next appointment please send Shirlee Latch a message through [place] or call our office at [phone].    TO LEAVE A MESSAGE FOR THE NURSE SELECT OPTION 2, PLEASE LEAVE A MESSAGE INCLUDING: YOUR NAME DATE OF BIRTH CALL BACK NUMBER REASON FOR CALL**this is important as we prioritize the call backs  YOU WILL RECEIVE A CALL BACK THE SAME DAY AS LONG AS YOU CALL BEFORE 4:00 PM   Do the following things EVERYDAY: Weigh yourself in the morning before breakfast. Write it down and keep it in a log. Take your medicines as prescribed Eat low salt foods--Limit salt (sodium) to 2000 mg per day.  Stay as active as you can everyday Limit all fluids for the day to less than 2 liters   At the [facility], you and your health needs are our priority. As part of our continuing mission to provide you with exceptional heart care, we have created designated Provider Care Teams. These Care Teams include your primary Cardiologist (physician) and Advanced Practice Providers (APPs- Physician Assistants and Nurse Practitioners) who all work together to provide you with the care you need, when you need it.   You may see any of the following providers on your designated Care Team at your next follow up: Dr [address] [phone], NP Arvilla Meres, Carron Curie Robbie Lis, PharmD   Please be sure to bring in all your medications bottles to every appointment.

## 2021-07-08 ENCOUNTER — Encounter (HOSPITAL_COMMUNITY): Payer: Self-pay | Admitting: Cardiology

## 2021-07-08 ENCOUNTER — Ambulatory Visit (HOSPITAL_COMMUNITY)
Admission: RE | Admit: 2021-07-08 | Discharge: 2021-07-08 | Disposition: A | Discharge status: Home / Self Care | Payer: Medicare Other | Source: Ambulatory Visit | Attending: Cardiology | Admitting: Cardiology

## 2021-07-08 VITALS — BP 140/70 | HR 66

## 2021-07-08 DIAGNOSIS — N183 Chronic kidney disease, stage 3 unspecified: Secondary | ICD-10-CM | POA: Diagnosis not present

## 2021-07-08 DIAGNOSIS — Z8673 Personal history of transient ischemic attack (TIA), and cerebral infarction without residual deficits: Secondary | ICD-10-CM | POA: Insufficient documentation

## 2021-07-08 DIAGNOSIS — I482 Chronic atrial fibrillation, unspecified: Secondary | ICD-10-CM | POA: Diagnosis not present

## 2021-07-08 DIAGNOSIS — J439 Emphysema, unspecified: Secondary | ICD-10-CM | POA: Diagnosis not present

## 2021-07-08 DIAGNOSIS — I2721 Secondary pulmonary arterial hypertension: Secondary | ICD-10-CM | POA: Insufficient documentation

## 2021-07-08 DIAGNOSIS — Z8616 Personal history of COVID-19: Secondary | ICD-10-CM | POA: Insufficient documentation

## 2021-07-08 DIAGNOSIS — Z7901 Long term (current) use of anticoagulants: Secondary | ICD-10-CM | POA: Diagnosis not present

## 2021-07-08 DIAGNOSIS — I130 Hypertensive heart and chronic kidney disease with heart failure and stage 1 through stage 4 chronic kidney disease, or unspecified chronic kidney disease: Secondary | ICD-10-CM | POA: Insufficient documentation

## 2021-07-08 DIAGNOSIS — Z79899 Other long term (current) drug therapy: Secondary | ICD-10-CM | POA: Insufficient documentation

## 2021-07-08 DIAGNOSIS — I5032 Chronic diastolic (congestive) heart failure: Secondary | ICD-10-CM | POA: Insufficient documentation

## 2021-07-08 DIAGNOSIS — I350 Nonrheumatic aortic (valve) stenosis: Secondary | ICD-10-CM | POA: Diagnosis not present

## 2021-07-08 DIAGNOSIS — I272 Pulmonary hypertension, unspecified: Secondary | ICD-10-CM | POA: Diagnosis not present

## 2021-07-08 LAB — BASIC METABOLIC PANEL
Anion gap: 12 (ref 5–15)
BUN: 28 mg/dL — ABNORMAL HIGH (ref 8–23)
CO2: 25 mmol/L (ref 22–32)
Calcium: 9.3 mg/dL (ref 8.9–10.3)
Chloride: 100 mmol/L (ref 98–111)
Creatinine, Ser: 1.67 mg/dL — ABNORMAL HIGH (ref 0.44–1.00)
GFR, Estimated: 29 mL/min — ABNORMAL LOW (ref >60)
Glucose, Bld: 97 mg/dL (ref 70–99)
Potassium: 3.4 mmol/L — ABNORMAL LOW (ref 3.5–5.1)
Sodium: 137 mmol/L (ref 135–145)

## 2021-07-08 LAB — BRAIN NATRIURETIC PEPTIDE: B Natriuretic Peptide: 378.7 pg/mL — ABNORMAL HIGH (ref 0.0–100.0)

## 2021-07-08 MED ORDER — TORSEMIDE 20 MG PO TABS: 60.0000 mg | ORAL_TABLET | Freq: Every day | ORAL | 5 refills | Status: DC

## 2021-07-08 MED ORDER — POTASSIUM CHLORIDE ER 10 MEQ PO TBCR: 40.0000 meq | EXTENDED_RELEASE_TABLET | Freq: Every day | ORAL | 5 refills | Status: DC

## 2021-07-08 NOTE — Patient Instructions (Addendum)
Labs done today. We will contact you only if your labs are abnormal.  INCREASE Torsemide to 80mg  (4 tablets) by mouth daily for 2 days THEN DECREASE to 60mg  (3 tablets) by mouth daily.   INCREASE Potassium to 31meq(4 tablets) by mouth daily.   No other medication changes were made. Please continue all current medications as prescribed.  Your physician recommends that you schedule a follow-up appointment in: 10 days for a lab only appointment and in 1 month with Dr. with an echo prior to your appointment.   Your physician has requested that you have an echocardiogram. Echocardiography is a painless test that uses sound waves to create images of your heart. It provides your doctor with information about the size and shape of your heart and how well your heart's chambers and valves are working. This procedure takes approximately one hour. There are no restrictions for this procedure.  If you have any questions or concerns before your next appointment please send [facility] a message through [place] or call our office at [phone].    TO LEAVE A MESSAGE FOR THE NURSE SELECT OPTION 2, PLEASE LEAVE A MESSAGE INCLUDING: YOUR NAME DATE OF BIRTH CALL BACK NUMBER REASON FOR CALL**this is important as we prioritize the call backs  YOU WILL RECEIVE A CALL BACK THE SAME DAY AS LONG AS YOU CALL BEFORE 4:00 PM   Do the following things EVERYDAY: Weigh yourself in the morning before breakfast. Write it down and keep it in a log. Take your medicines as prescribed Eat low salt foods--Limit salt (sodium) to 2000 mg per day.  Stay as active as you can everyday Limit all fluids for the day to less than 2 liters   At the [facility], you and your health needs are our priority. As part of our continuing mission to provide you with exceptional heart care, we have created designated Provider Care Teams. These Care Teams include your primary Cardiologist (physician) and Advanced Practice  Providers (APPs- Physician Assistants and Nurse Practitioners) who all work together to provide you with the care you need, when you need it.   You may see any of the following providers on your designated Care Team at your next follow up: Dr [address] Dr [phone], NP Arvilla Meres, Carron Curie Robbie Lis, PharmD   Please be sure to bring in all your medications bottles to every appointment.

## 2021-07-08 NOTE — Progress Notes (Signed)
[facility] Note   Date:  07/08/2021   ID:  [name], DOB 27-Apr-1929, MRN [id number]  Provider location: [facility] Type of Visit: Established patient  PCP:  Rachael Myers., MD  HF Cardiologist:  Dr. Shirlee Myers   HPI: Rachael Myers is a 88 y.o. female who has a history of chronic diastolic CHF, chronic atrial fibrillation, and prominent exertional dyspnea.  She has had atrial fibrillation long-term and has failed Multaq and propafenone use.  She is in atrial fibrillation chronically now.  In 1/16, she developed PNA.  She had a parapneumonic effusion that required 4 thoracenteses.  Eventually, she had VATS with decortication and a wedge resection.  Cytology was negative from the pleural fluid.  Last chest CT was at [facility] in 8/16 and showed no PE, mild interstitial edema.  Last echo was done in 11/16, showing normal LV EF and apparently normal RV EF.  PA systolic pressure estimation was moderate to severely increased.  I took her for RHC in 12/16, this showed moderate pulmonary arterial hypertension.  PFTs showed a restrictive pattern concerning for interstitial lung disease.  V/Q scan in 12/16 was not suggestive of chronic PE.  Despite abnormal PFTs, high resolution CT did not show interstitial lung disease.     At a prior appointment, started her on Opsumit.  She developed a cough and mouth soreness with this medication and had to stop it after about 7 days.  Symptoms resolved when she stopped it.  Tried to get Adcirca for her.  This was denied by her insurance company, so she was started on Revatio 20 mg tid instead.  She thinks that this has helped her breathing "a little."  She saw a pulmonary specialist who thought dyspnea was likely due to [facility].  Next, tried her on ambrisentan, but she developed facial swelling on ambrisentan (significant around the eyes).  She also developed worsening dyspnea.  She stopped ambrisentan and the swelling  resolved but she was noted to be volume overloaded.  Switched her Lasix to torsemide.  She was started on selexipag which she has tolerated.    She had a difficult winter with episode of PNA in 2/20.  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 There was moderate emphysema though she never smoked.  She says that her pulmonologist in [facility] followed up on her CT with a PET scan that was normal.   She had a COVID-19 infection in 12/20 but appears to have recovered well.   Echo in 5/21 showed EF 60-65%, mild LVH, PASP 33 mmHg, normal RV, mild MR, mild-moderate AS with mean gradient 14 mmHg, AVA 1.23 cm^2.  PYP scan in 5/21 was not suggestive of TTR amyloidosis.   [name] and had mastectomy (no chemo or radiation).   Echo in 5/22 showed EF 55-60%, mildly decreased RV systolic function, PASP 47, mild MR, mild AS mean gradient 10 mmHg.   She had a fall in 11/22 at home, seems to have been mechanical.  She developed a scalp hematoma and bruising on her arm.  Xarelto was held.  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  Xarelto was restarted.  She was sent to rehab for 3 wks and was not given her torsemide.    Patient was admitted twice at the hospital in [facility] in 4/23, once  with diastolic CHF and once with ?TIA.  Workup was negative for recurrent CVA.  Echo in 4/23 showed EF 55-60%, mild LVH, mild RV dilation with decreased systolic function, mild AS with mean gradient 15 mmHg, PASP 33 mmHg.  She stopped Jardiance, does not feel like she could tolerate it.   Today she returns for HF followup. Weight is down 2 lbs.  SBP running in 120s at home, which is lower than in the past.  Her legs have been weak despite PT.  She is short of breath with moderate exertion such as taking a shower.  She walks in the house with a cane, gets mildly short of  breath walking longer distances.  Symptoms have been worse for several months.  She has a poor appetite and feels "bloated" when she eats.   Labs (9/16): K 4, creatinine 1.31, BNP 466 Labs (11/16): BNP 577 Labs (12/16); K 4, creatinine 1.3, normal rheumatoid factor and scleroderma serologies Labs (1/17): ANA positive but only 1:80 titer, dsDNA negative, SSA/B negative, CCP negative.  Labs (2/17): K 4.1, creatinine 1.48 => 1.42, BNP 487 Labs (3/17): K 4.2, creatinine 1.58, BNP 326 Labs (7/17): K 3.3, creatinine 1.7, hgb 10.7 Labs (8/17): K 3.9, creatinine 1.87, HCT 35.6 Labs (1/18): hgb 9.9, creatinine 1.42 Labs (4/18): TSH normal, hgb 10.5, K 3.8, creatinine 1.34 Labs (6/18): BNP 415, K 4.9, creatinine 1.45 Labs (1/19): K 4.4, creatinine 1.56, hgb 11.3 Labs (7/19): K 4, creatinine 1.5, BNP 381 Labs (1/20): K 3.8, creatinine 1.38 Labs (11/20): K 3.8, creatinine 1.59 Labs (5/21): K 3.8, creatinine 1.4, myeloma panel negative, urine immunofixation negative Labs (8/21): BNP 319, hgb 10.2, K 4.4, creatinine 1.7 Labs (5/22): LDL 87, TSH normal, hgb 12.6, K 3.9, creatinine 1.26 Labs (11/22): LDL 72 Labs (12/22): K 3.7, creatinine 1.59 Labs (1/23): K 3.7, creatinine 1.30 Labs (3/23): K 3.6, creatinine 1.33 Labs (4/23): K 4, creatinine 1.31, LDL 76, HDL 45 Labs (6/23): K 3.3, creatinine 1.46, BNP 390   PMH: 1. Cardiolite (9/16) with EF 54%, no ischemia/infarction. 2. Chronic diastolic CHF: Echo (11/16) with EF 55-60%, normal RV size and systolic function, PA systolic pressure 66 mmHg.  - Echo (7/17) with EF 60-65%, mildly dilated RV with mildly decreased systolic function, PA systolic pressure 36 mmHg.  - Echo (7/18) with EF 55-60%, mild to moderate AI, normal RV size and systolic function, PASP 43 mmHg.  - Echo (1/20) with EF 55-60%, normal RV size and systolic function, mild AI, mild MR.  - Echo (5/21) witih EF 60-65%, mild LVH, PASP 33 mmHg, normal RV, mild MR, mild-moderate AS with mean  gradient 14 mmHg, AVA 1.23 cm^2.  - PYP scan (5/21): grade 0, H/CL 1.1 (not suggestive of TTR amyloidosis).  - Echo (5/22): EF 55-60%, mildly decreased RV systolic function, PASP 47, mild MR, mild AS mean gradient 10 mmHg. - Echo (4/23): EF 55-60%, mild LVH, mild RV dilation with decreased systolic function, mild AS with mean gradient 15 mmHg, PASP 33 mmHg. 3. Chronic atrial fibrillation: She has been intolerant of Multaq and propafenone.  Sotalol and flecainide have not been used due to CKD.   4. SBO: Managed conservatively. 5. CKD stage 3 6. OA: h/o THR and TKR.  7. H/o hyponatremia 8. Pneumonia with parapneumonic effusion with thoracenteses and eventual VATS and decortication.  9. Pulmonary hypertension: Suspect Group 1 pulmonary hypertension.  RHC (12/16) with mean RA 5, PA 62/22 mean 40, mean PCWP 19, CI 2.46, PVR 5 WU.  PFTs (12/16)  wiith FVC 71%, FEV1 73%, ratio 104%, TLC 71%, DLCO 53% => moderate restriction concerning for interstitial lung disease.  High resolution CT (12/16) did not show evidence for interstitial lung disease.  V/Q scan (12/16) did not show evidence for chronic PE.  Serologies: RF negative, scleroderma antibodies negative, ANA positive but only 1:80 titer, SSA/B negative, dsDNA negative, CCP negative.  She did not tolerate macitentan or ambrisentan.  Echo [id number] with PA systolic pressure down to 36 mmHg.   - 6 minute walk (12/16) with 244 meters - 6 minute walk (2/17) with 232 meters - 6 minute walk (3/17) with 414 meters - 6 minute walk (4/17) with 267 meters - 6 minute walk (8/17) with 241 meters - 6 minute walk (10/17) with 256 meters - 6 minute walk (6/18) with 305 meters - 6 minute walk (9/18) with 320 meters - 6 minute walk (1/19) with 213 meters - 6 minute walk (7/19) with 335 meters - 6 minute walk (1/20) with 213 meters - 6 minute walk (10/20) with 243 meters - 6 minute walk (2/21) with 243 meters - 6 minute walk (8/21) with 214 meters - 6 minute walk (2/22)  with 260 meters - 6 minute walk (7/22) with 274 meters 10. Lightheadedness/syncope: Extensive workup.  Event and holter monitors as well as telemetry monitoring negative.  She has not been orthostatic.  - Did not tolerate pyridostigmine due to hot flashes.  11. Sciatica: Status post back surgery 1/18.  12. COVID-19 infection 12/20.  13. Peripheral neuropathy 14. Aortic stenosis: Mild to moderate on 5/21 echo.  - Mild on 4/23 echo.  15. Breast cancer: Triple negative on left, s/p mastectomy in 7/21.  16. CVA: 11/22, occurred while off anticoagulation.  - Carotid dopplers (11/22) showed 1-39% BICA stenosis.    Current Outpatient Medications  Medication Sig Dispense Refill   amoxicillin (AMOXIL) 500 MG capsule As needed before dental procedures     aspirin 81 MG chewable tablet Chew 81 mg by mouth daily.     metoprolol succinate (TOPROL-XL) 25 MG 24 hr tablet TAKE 1 TABLET AT BEDTIME (CHANGE IN DOSE OR TABLET SIZE) 90 tablet 3   PREDNISOLONE ACETATE OP Apply 1 drop to eye in the morning, at noon, in the evening, and at bedtime. Left eye     REVATIO 20 MG tablet TAKE 4 TABLETS THREE TIMES A DAY 1080 tablet 3   UPTRAVI 1000 MCG TABS Take 1 tablet by mouth 2 (two) times daily. 180 tablet 3   XARELTO 15 MG TABS tablet TAKE 1 TABLET DAILY WITH SUPPER 90 tablet 3   potassium chloride (KLOR-CON) 10 MEQ tablet Take 4 tablets (40 mEq total) by mouth daily. 120 tablet 5   torsemide (DEMADEX) 20 MG tablet Take 3 tablets (60 mg total) by mouth daily. 90 tablet 5   No current facility-administered medications for this encounter.   Allergies:   Acetaminophen, Hydrocodone-acetaminophen, Letairis [ambrisentan], Opsumit [macitentan], Ace inhibitors, Atenolol, Hctz [hydrochlorothiazide], Oxycodone, and Diltiazem hcl   Social History:  The patient  reports that she has never smoked. She has never used smokeless tobacco. She reports that she does not drink alcohol and does not use drugs.   Family History:   The patient's family history includes Cancer in her mother.   ROS:  Please see the history of present illness.   All other systems are personally reviewed and negative.   Recent Labs: 07/29/2020: Hemoglobin 12.6; Platelets 210 07/08/2021: B Natriuretic Peptide 378.7; BUN 28; Creatinine, Ser 1.67; Potassium  3.4; Sodium 137  Personally reviewed   Wt Readings from Last 3 Encounters:  07/02/21 62.1 kg (137 lb)  06/02/21 62.9 kg (138 lb 9.6 oz)  05/12/21 62.5 kg (137 lb 12.8 oz)   Physical Exam:   BP 140/70   Pulse 66   SpO2 99%  General: NAD Neck: JVP 10-12 cm, no thyromegaly or thyroid nodule.  Lungs: Clear to auscultation bilaterally with normal respiratory effort. CV: Nondisplaced PMI.  Heart irregular S1/S2, no [id number], 3/6 systolic murmur RUSB.  1+ ankle edema.  No carotid bruit.  Normal pedal pulses.  Abdomen: Soft, nontender, no hepatosplenomegaly, no distention.  Skin: Intact without lesions or rashes.  Neurologic: Alert and oriented x 3.  Psych: Normal affect. Extremities: No clubbing or cyanosis.  HEENT: Normal.   Assessment & Plan: 1. Chronic diastolic CHF:  Most recent echo at [facility] in 4/23 showed EF 55-60%, mild LVH, mild RV dilation with decreased systolic function, mild AS with mean gradient 15 mmHg, PASP 33 mmHg.  NYHA class IIIb symptoms, she is volume overloaded on exam today.   - Increase torsemide to 80 mg daily x 2 days then 60 mg daily. Increase KCl to 40 daily.  BMET/BNP today and in 10 days.  - She did not tolerate Jardiance.   - Continue compression stockings.  2. Pulmonary hypertension: Moderate to severe pulmonary hypertension by echo (PASP 66 mmHg). RHC showed moderate pulmonary arterial hypertension with PVR 5 WU.  PFTs with restrictive changes suggestive of interstitial process but high resolution CT did not show interstitial lung disease.  No evidence for chronic PE on V/Q scan.  Rheumatologic serologies all negative.  Possible group 1 pulmonary  hypertension.  She was unable to tolerate macitentan or ambrisentan.  Insurance would not approve Engineer, site.  She is now on sildenafil 80 mg tid and selexipag 1000 mcg bid, unable to increase selexipag further due to symptoms.  Echo in 4/23  showed mildly decreased RV systolic function and PASP 33 mmHg.  - Defer 6 minute walk today with volume overload.  - Continue Selexipag at 1000 mcg bid, unable to tolerate uptitration of Selexipag any further.  - Continue sildenafil 80 mg tid.   3. Pulmonary: s/p PNA with parapneumonic effusion and eventual VATS with decortication.  As above, restrictive PFTs but no ILD on high resolution CT.  CT in 4/20 showed a concerning RLL nodule and emphysema, though she never smoked. Her pulmonologist ordered a PET scan after this per her report, and PET was normal.  4. Atrial fibrillation: Chronic. Following rate control/anticoagulation strategy.   - Continue Toprol XL. - Continue Xarelto 15 mg daily.  5. HTN: BP currently is controlled.  - I see where she has been referred to a nephrologist for BP management.  Would be very careful before starting antihypertensives given orthostatic history and fall risk.  - Continue to wear compression stockings.  - Continue Toprol XL 25 mg at bedtime.  6. CKD: Stage 3.  BMET today.  7. Aortic stenosis: Mild on 4/23 echo.  8. CVA: She had a stroke in 11/22 while off Xarelto.  Carotid dopplers were unremarkable.  Her stroke was atrial fibrillation-related. She needs to stay on Xarelto if at all possible.  - Currently off atorvastatin, needs to restart.   Followup in 1 month.   Signed, Loralie Champagne, MD  07/08/2021  [facility] [address] [facility] and [place] [address] [phone] (office) [phone] (fax)

## 2021-07-20 ENCOUNTER — Ambulatory Visit (HOSPITAL_COMMUNITY)
Admission: RE | Admit: 2021-07-20 | Discharge: 2021-07-20 | Disposition: A | Discharge status: Home / Self Care | Payer: Medicare Other | Source: Ambulatory Visit | Attending: Internal Medicine | Admitting: Internal Medicine

## 2021-07-20 DIAGNOSIS — I5032 Chronic diastolic (congestive) heart failure: Secondary | ICD-10-CM

## 2021-07-20 LAB — BASIC METABOLIC PANEL
Anion gap: 11 (ref 5–15)
BUN: 26 mg/dL — ABNORMAL HIGH (ref 8–23)
CO2: 25 mmol/L (ref 22–32)
Calcium: 9.2 mg/dL (ref 8.9–10.3)
Chloride: 100 mmol/L (ref 98–111)
Creatinine, Ser: 1.7 mg/dL — ABNORMAL HIGH (ref 0.44–1.00)
GFR, Estimated: 28 mL/min — ABNORMAL LOW (ref >60)
Glucose, Bld: 100 mg/dL — ABNORMAL HIGH (ref 70–99)
Potassium: 4.2 mmol/L (ref 3.5–5.1)
Sodium: 136 mmol/L (ref 135–145)

## 2021-08-07 ENCOUNTER — Encounter (HOSPITAL_COMMUNITY): Payer: Self-pay | Admitting: Cardiology

## 2021-08-07 ENCOUNTER — Ambulatory Visit (HOSPITAL_COMMUNITY)
Admission: RE | Admit: 2021-08-07 | Discharge: 2021-08-07 | Disposition: A | Discharge status: Home / Self Care | Payer: Medicare Other | Source: Ambulatory Visit | Attending: Internal Medicine | Admitting: Internal Medicine

## 2021-08-07 ENCOUNTER — Ambulatory Visit (HOSPITAL_BASED_OUTPATIENT_CLINIC_OR_DEPARTMENT_OTHER)
Admission: RE | Admit: 2021-08-07 | Discharge: 2021-08-07 | Disposition: A | Discharge status: Home / Self Care | Payer: Medicare Other | Source: Ambulatory Visit | Attending: Cardiology | Admitting: Cardiology

## 2021-08-07 VITALS — BP 130/78 | HR 88 | Wt 126.2 lb

## 2021-08-07 DIAGNOSIS — I5032 Chronic diastolic (congestive) heart failure: Secondary | ICD-10-CM

## 2021-08-07 DIAGNOSIS — I482 Chronic atrial fibrillation, unspecified: Secondary | ICD-10-CM | POA: Diagnosis not present

## 2021-08-07 DIAGNOSIS — I350 Nonrheumatic aortic (valve) stenosis: Secondary | ICD-10-CM | POA: Diagnosis not present

## 2021-08-07 DIAGNOSIS — J439 Emphysema, unspecified: Secondary | ICD-10-CM | POA: Insufficient documentation

## 2021-08-07 DIAGNOSIS — Z7901 Long term (current) use of anticoagulants: Secondary | ICD-10-CM | POA: Diagnosis not present

## 2021-08-07 DIAGNOSIS — I700 Atherosclerosis of aorta: Secondary | ICD-10-CM | POA: Insufficient documentation

## 2021-08-07 DIAGNOSIS — I130 Hypertensive heart and chronic kidney disease with heart failure and stage 1 through stage 4 chronic kidney disease, or unspecified chronic kidney disease: Secondary | ICD-10-CM | POA: Insufficient documentation

## 2021-08-07 DIAGNOSIS — I2721 Secondary pulmonary arterial hypertension: Secondary | ICD-10-CM | POA: Diagnosis not present

## 2021-08-07 DIAGNOSIS — M79606 Pain in leg, unspecified: Secondary | ICD-10-CM | POA: Insufficient documentation

## 2021-08-07 DIAGNOSIS — Z8673 Personal history of transient ischemic attack (TIA), and cerebral infarction without residual deficits: Secondary | ICD-10-CM | POA: Diagnosis not present

## 2021-08-07 DIAGNOSIS — N183 Chronic kidney disease, stage 3 unspecified: Secondary | ICD-10-CM | POA: Insufficient documentation

## 2021-08-07 DIAGNOSIS — Z79899 Other long term (current) drug therapy: Secondary | ICD-10-CM | POA: Diagnosis not present

## 2021-08-07 LAB — ECHOCARDIOGRAM COMPLETE
AR max vel: 2.41 cm2
AV Area VTI: 2.26 cm2
AV Area mean vel: 2.29 cm2
AV Mean grad: 4 mmHg
AV Peak grad: 7 mmHg
Ao pk vel: 1.32 m/s
MV VTI: 2.34 cm2
S' Lateral: 2.8 cm

## 2021-08-07 LAB — BASIC METABOLIC PANEL
Anion gap: 11 (ref 5–15)
BUN: 25 mg/dL — ABNORMAL HIGH (ref 8–23)
CO2: 25 mmol/L (ref 22–32)
Calcium: 9.2 mg/dL (ref 8.9–10.3)
Chloride: 103 mmol/L (ref 98–111)
Creatinine, Ser: 1.35 mg/dL — ABNORMAL HIGH (ref 0.44–1.00)
GFR, Estimated: 37 mL/min — ABNORMAL LOW (ref >60)
Glucose, Bld: 97 mg/dL (ref 70–99)
Potassium: 3.9 mmol/L (ref 3.5–5.1)
Sodium: 139 mmol/L (ref 135–145)

## 2021-08-07 LAB — BRAIN NATRIURETIC PEPTIDE: B Natriuretic Peptide: 341.9 pg/mL — ABNORMAL HIGH (ref 0.0–100.0)

## 2021-08-07 MED ORDER — TORSEMIDE 20 MG PO TABS: 80.0000 mg | ORAL_TABLET | Freq: Every day | ORAL | 5 refills | Status: DC

## 2021-08-07 NOTE — Patient Instructions (Signed)
Change Torsemide to 80mg  (4 Tabs ) alternating with 60mg  daily.  Labs done today, your results will be available in MyChart, we will contact you for abnormal readings.  Repeat blood work in 10 days.  Your physician recommends that you schedule a follow-up appointment in: 3 months (November 2023)  ** please call the office in [facility] September to arrange your follow up **  If you have any questions or concerns before your next appointment please send [facility] a message through [place] or call our office at [phone].    TO LEAVE A MESSAGE FOR THE NURSE SELECT OPTION 2, PLEASE LEAVE A MESSAGE INCLUDING: YOUR NAME DATE OF BIRTH CALL BACK NUMBER REASON FOR CALL**this is important as we prioritize the call backs  YOU WILL RECEIVE A CALL BACK THE SAME DAY AS LONG AS YOU CALL BEFORE 4:00 PM  At the [facility], you and your health needs are our priority. As part of our continuing mission to provide you with exceptional heart care, we have created designated Provider Care Teams. These Care Teams include your primary Cardiologist (physician) and Advanced Practice Providers (APPs- Physician Assistants and Nurse Practitioners) who all work together to provide you with the care you need, when you need it.   You may see any of the following providers on your designated Care Team at your next follow up: Dr [address] [phone], NP Arvilla Meres, Carron Curie [facility], [facility] [place], PharmD   Please be sure to bring in all your medications bottles to every appointment.

## 2021-08-07 NOTE — Progress Notes (Signed)
  Echocardiogram 2D Echocardiogram has been performed.  Gerda Diss 08/07/2021, 11:52 AM

## 2021-08-09 NOTE — Progress Notes (Signed)
[facility] Clinic Note   Date:  08/09/2021   ID:  Rachael Myers, DOB 1929-05-14, MRN [id number]  Provider location: [facility] Type of Visit: Established patient  PCP:  Myrtis Hopping., MD  HF Cardiologist:  Dr. Aundra Dubin   HPI: Rachael Myers is a 88 y.o. female who has a history of chronic diastolic CHF, chronic atrial fibrillation, and prominent exertional dyspnea.  She has had atrial fibrillation long-term and has failed Multaq and propafenone use.  She is in atrial fibrillation chronically now.  In 1/16, she developed PNA.  She had a parapneumonic effusion that required 4 thoracenteses.  Eventually, she had VATS with decortication and a wedge resection.  Cytology was negative from the pleural fluid.  Last chest CT was at [facility] in 8/16 and showed no PE, mild interstitial edema.  Last echo was done in 11/16, showing normal LV EF and apparently normal RV EF.  PA systolic pressure estimation was moderate to severely increased.  I took her for [place] in 12/16, this showed moderate pulmonary arterial hypertension.  PFTs showed a restrictive pattern concerning for interstitial lung disease.  V/Q scan in 12/16 was not suggestive of chronic PE.  Despite abnormal PFTs, high resolution CT did not show interstitial lung disease.     At a prior appointment, started her on Opsumit.  She developed a cough and mouth soreness with this medication and had to stop it after about 7 days.  Symptoms resolved when she stopped it.  Tried to get Adcirca for her.  This was denied by her insurance company, so she was started on Revatio 20 mg tid instead.  She thinks that this has helped her breathing "a little."  She saw a pulmonary specialist who thought dyspnea was likely due to [facility].  Next, tried her on ambrisentan, but she developed facial swelling on ambrisentan (significant around the eyes).  She also developed worsening dyspnea.  She stopped ambrisentan and the swelling  resolved but she was noted to be volume overloaded.  Switched her Lasix to torsemide.  She was started on selexipag which she has tolerated.    She had a difficult winter with episode of PNA in 2/20.  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 There was moderate emphysema though she never smoked.  She says that her pulmonologist in [facility] followed up on her CT with a PET scan that was normal.   She had a COVID-19 infection in 12/20 but appears to have recovered well.   Echo in 5/21 showed EF 60-65%, mild LVH, PASP 33 mmHg, normal RV, mild MR, mild-moderate AS with mean gradient 14 mmHg, AVA 1.23 cm^2.  PYP scan in 5/21 was not suggestive of TTR amyloidosis.   [name] and had mastectomy (no chemo or radiation).   Echo in 5/22 showed EF 55-60%, mildly decreased RV systolic function, PASP 47, mild MR, mild AS mean gradient 10 mmHg.   She had a fall in 11/22 at home, seems to have been mechanical.  She developed a scalp hematoma and bruising on her arm.  Xarelto was held.  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  Xarelto was restarted.  She was sent to rehab for 3 wks and was not given her torsemide.    Patient was admitted twice at the hospital in [facility] in 4/23, once  with diastolic CHF and once with ?TIA.  Workup was negative for recurrent CVA.  Echo in 4/23 showed EF 55-60%, mild LVH, mild RV dilation with decreased systolic function, mild AS with mean gradient 15 mmHg, PASP 33 mmHg.  She stopped Jardiance, does not feel like she could tolerate it.   Echo was done today and reviewed, EF 55-60%, mild LVH, normal RV, PASP 42 mmHg, dilated IVC.   Today she returns for HF followup. Weight trending down.  SBP generally running 100s-110s, occasionally has a high reading.  She gets lightheaded when SBP < 90 (not very often now).  No dyspnea  walking on flat ground but tires quickly with distances.  Legs hurt in the calves/thighs bilaterally with walking.  No chest pain.   Labs (9/16): K 4, creatinine 1.31, BNP 466 Labs (11/16): BNP 577 Labs (12/16); K 4, creatinine 1.3, normal rheumatoid factor and scleroderma serologies Labs (1/17): ANA positive but only 1:80 titer, dsDNA negative, SSA/B negative, CCP negative.  Labs (2/17): K 4.1, creatinine 1.48 => 1.42, BNP 487 Labs (3/17): K 4.2, creatinine 1.58, BNP 326 Labs (7/17): K 3.3, creatinine 1.7, hgb 10.7 Labs (8/17): K 3.9, creatinine 1.87, HCT 35.6 Labs (1/18): hgb 9.9, creatinine 1.42 Labs (4/18): TSH normal, hgb 10.5, K 3.8, creatinine 1.34 Labs (6/18): BNP 415, K 4.9, creatinine 1.45 Labs (1/19): K 4.4, creatinine 1.56, hgb 11.3 Labs (7/19): K 4, creatinine 1.5, BNP 381 Labs (1/20): K 3.8, creatinine 1.38 Labs (11/20): K 3.8, creatinine 1.59 Labs (5/21): K 3.8, creatinine 1.4, myeloma panel negative, urine immunofixation negative Labs (8/21): BNP 319, hgb 10.2, K 4.4, creatinine 1.7 Labs (5/22): LDL 87, TSH normal, hgb 12.6, K 3.9, creatinine 1.26 Labs (11/22): LDL 72 Labs (12/22): K 3.7, creatinine 1.59 Labs (1/23): K 3.7, creatinine 1.30 Labs (3/23): K 3.6, creatinine 1.33 Labs (4/23): K 4, creatinine 1.31, LDL 76, HDL 45 Labs (6/23): K 3.3, creatinine 1.46, BNP 390 Labs (7/23): K 4.2, creaitnine 1.7   PMH: 1. Cardiolite (9/16) with EF 54%, no ischemia/infarction. 2. Chronic diastolic CHF: Echo (11/16) with EF 55-60%, normal RV size and systolic function, PA systolic pressure 66 mmHg.  - Echo (7/17) with EF 60-65%, mildly dilated RV with mildly decreased systolic function, PA systolic pressure 36 mmHg.  - Echo (7/18) with EF 55-60%, mild to moderate AI, normal RV size and systolic function, PASP 43 mmHg.  - Echo (1/20) with EF 55-60%, normal RV size and systolic function, mild AI, mild MR.  - Echo (5/21) witih EF 60-65%, mild LVH, PASP 33 mmHg, normal RV, mild MR,  mild-moderate AS with mean gradient 14 mmHg, AVA 1.23 cm^2.  - PYP scan (5/21): grade 0, H/CL 1.1 (not suggestive of TTR amyloidosis).  - Echo (5/22): EF 55-60%, mildly decreased RV systolic function, PASP 47, mild MR, mild AS mean gradient 10 mmHg. - Echo (4/23): EF 55-60%, mild LVH, mild RV dilation with decreased systolic function, mild AS with mean gradient 15 mmHg, PASP 33 mmHg. - Echo (8/23): EF 55-60%, mild LVH, normal RV, PASP 42 mmHg, dilated IVC 3. Chronic atrial fibrillation: She has been intolerant of Multaq and propafenone.  Sotalol and flecainide have not been used due to CKD.   4. SBO: Managed conservatively. 5. CKD stage 3 6. OA: h/o THR and TKR.  7. H/o hyponatremia 8. Pneumonia with parapneumonic effusion with thoracenteses and eventual VATS and decortication.  9. Pulmonary hypertension: Suspect Group 1 pulmonary hypertension.  RHC (12/16) with mean RA 5, PA 62/22 mean 40,  mean PCWP 19, CI 2.46, PVR 5 WU.  PFTs (12/16) wiith FVC 71%, FEV1 73%, ratio 104%, TLC 71%, DLCO 53% => moderate restriction concerning for interstitial lung disease.  High resolution CT (12/16) did not show evidence for interstitial lung disease.  V/Q scan (12/16) did not show evidence for chronic PE.  Serologies: RF negative, scleroderma antibodies negative, ANA positive but only 1:80 titer, SSA/B negative, dsDNA negative, CCP negative.  She did not tolerate macitentan or ambrisentan.  Echo [id number] with PA systolic pressure down to 36 mmHg.   - 6 minute walk (12/16) with 244 meters - 6 minute walk (2/17) with 232 meters - 6 minute walk (3/17) with 414 meters - 6 minute walk (4/17) with 267 meters - 6 minute walk (8/17) with 241 meters - 6 minute walk (10/17) with 256 meters - 6 minute walk (6/18) with 305 meters - 6 minute walk (9/18) with 320 meters - 6 minute walk (1/19) with 213 meters - 6 minute walk (7/19) with 335 meters - 6 minute walk (1/20) with 213 meters - 6 minute walk (10/20) with 243 meters -  6 minute walk (2/21) with 243 meters - 6 minute walk (8/21) with 214 meters - 6 minute walk (2/22) with 260 meters - 6 minute walk (7/22) with 274 meters 10. Lightheadedness/syncope: Extensive workup.  Event and holter monitors as well as telemetry monitoring negative.  She has not been orthostatic.  - Did not tolerate pyridostigmine due to hot flashes.  11. Sciatica: Status post back surgery 1/18.  12. COVID-19 infection 12/20.  13. Peripheral neuropathy 14. Aortic stenosis: Mild to moderate on 5/21 echo.  - Mild on 4/23 echo.  15. Breast cancer: Triple negative on left, s/p mastectomy in 7/21.  16. CVA: 11/22, occurred while off anticoagulation.  - Carotid dopplers (11/22) showed 1-39% BICA stenosis.    Current Outpatient Medications  Medication Sig Dispense Refill   amoxicillin (AMOXIL) 500 MG capsule As needed before dental procedures     aspirin 81 MG chewable tablet Chew 81 mg by mouth daily.     metoprolol succinate (TOPROL-XL) 25 MG 24 hr tablet TAKE 1 TABLET AT BEDTIME (CHANGE IN DOSE OR TABLET SIZE) 90 tablet 3   potassium chloride (KLOR-CON) 10 MEQ tablet Take 4 tablets (40 mEq total) by mouth daily. 120 tablet 5   PREDNISOLONE ACETATE OP Apply 1 drop to eye in the morning, at noon, in the evening, and at bedtime. Left eye     REVATIO 20 MG tablet TAKE 4 TABLETS THREE TIMES A DAY 1080 tablet 3   UPTRAVI 1000 MCG TABS Take 1 tablet by mouth 2 (two) times daily. 180 tablet 3   XARELTO 15 MG TABS tablet TAKE 1 TABLET DAILY WITH SUPPER 90 tablet 3   torsemide (DEMADEX) 20 MG tablet Take 4 tablets (80 mg total) by mouth daily. Alternating with 60mg  daily 90 tablet 5   No current facility-administered medications for this encounter.   Allergies:   Acetaminophen, Hydrocodone-acetaminophen, Letairis [ambrisentan], Opsumit [macitentan], Ace inhibitors, Atenolol, Hctz [hydrochlorothiazide], Oxycodone, and Diltiazem hcl   Social History:  The patient  reports that she has never smoked.  She has never used smokeless tobacco. She reports that she does not drink alcohol and does not use drugs.   Family History:  The patient's family history includes Cancer in her mother.   ROS:  Please see the history of present illness.   All other systems are personally reviewed and negative.   Recent Labs: 08/07/2021:  B Natriuretic Peptide 341.9; BUN 25; Creatinine, Ser 1.35; Potassium 3.9; Sodium 139  Personally reviewed   Wt Readings from Last 3 Encounters:  08/07/21 57.2 kg (126 lb 3.2 oz)  07/02/21 62.1 kg (137 lb)  06/02/21 62.9 kg (138 lb 9.6 oz)   Physical Exam:   BP 130/78   Pulse 88   Wt 57.2 kg (126 lb 3.2 oz)   SpO2 99%   BMI 19.77 kg/m  General: NAD Neck: JVP 8-9 cm, no thyromegaly or thyroid nodule.  Lungs: Clear to auscultation bilaterally with normal respiratory effort. CV: Nondisplaced PMI.  Heart regular S1/S2, no S3/S4, 2/6 early SEM RUSB.  Trace ankle edema.  No carotid bruit.  Difficult to palpate pedal pulses.  Abdomen: Soft, nontender, no hepatosplenomegaly, no distention.  Skin: Intact without lesions or rashes.  Neurologic: Alert and oriented x 3.  Psych: Normal affect. Extremities: No clubbing or cyanosis.  HEENT: Normal.   Assessment & Plan: 1. Chronic diastolic CHF:  Echo today showed EF 55-60%, mild LVH, normal RV, PASP 42 mmHg, dilated IVC.  NYHA class III symptoms.  Still volume overloaded but improving.  - Increase torsemide to 80 mg daily alternating with 60 mg daily. BMET/BNP today and in 10 days.  - She did not tolerate Jardiance.   - Continue compression stockings.  2. Pulmonary hypertension: Moderate to severe pulmonary hypertension by echo (PASP 66 mmHg). RHC showed moderate pulmonary arterial hypertension with PVR 5 WU.  PFTs with restrictive changes suggestive of interstitial process but high resolution CT did not show interstitial lung disease.  No evidence for chronic PE on V/Q scan.  Rheumatologic serologies all negative.  Possible group  1 pulmonary hypertension.  She was unable to tolerate macitentan or ambrisentan.  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  She is now on sildenafil 80 mg tid and selexipag 1000 mcg bid, unable to increase selexipag further due to symptoms.  Echo in today  showed normal RV and mildly elevated PA pressure.  - Defer 6 minute walk today with volume overload.  - Continue Selexipag at 1000 mcg bid, unable to tolerate uptitration of Selexipag any further.  - Continue sildenafil 80 mg tid.   3. Pulmonary: s/p PNA with parapneumonic effusion and eventual VATS with decortication.  As above, restrictive PFTs but no ILD on high resolution CT.  CT in 4/20 showed a concerning RLL nodule and emphysema, though she never smoked. Her pulmonologist ordered a PET scan after this per her report, and PET was normal.  4. Atrial fibrillation: Chronic. Following rate control/anticoagulation strategy.   - Continue Toprol XL. - Continue Xarelto 15 mg daily.  5. HTN: BP currently is controlled.  - I see where she has been referred to a nephrologist for BP management.  Would be very careful before starting antihypertensives given orthostatic history and fall risk.  - Continue to wear compression stockings.  - Continue Toprol XL 25 mg at bedtime.  6. CKD: Stage 3.  BMET today.  7. Aortic stenosis: Mild on 4/23 echo, only aortic sclerosis by our echo today.  8. CVA: She had a stroke in 11/22 while off Xarelto.  Carotid dopplers were unremarkable.  Her stroke was atrial fibrillation-related. She needs to stay on Xarelto if at all possible.  - Currently off atorvastatin, needs to restart.  9. Leg pain: May be neuropathy but hard to feel pulses.  - Will get ABIs.   Followup in 3 months.   Signed, [name], MD  08/09/2021  Advanced [facility]  [place] [address] [phone] (office) [phone] (fax)

## 2021-09-04 ENCOUNTER — Encounter (HOSPITAL_COMMUNITY): Payer: Medicare Other | Admitting: Cardiology

## 2021-09-10 ENCOUNTER — Other Ambulatory Visit (HOSPITAL_COMMUNITY): Payer: Self-pay | Admitting: Cardiology

## 2021-09-15 ENCOUNTER — Other Ambulatory Visit (HOSPITAL_COMMUNITY): Payer: Medicare Other

## 2021-10-06 ENCOUNTER — Telehealth (HOSPITAL_COMMUNITY): Payer: Self-pay

## 2021-10-06 NOTE — Telephone Encounter (Signed)
Patient advised and verbalized understanding 

## 2021-10-06 NOTE — Telephone Encounter (Signed)
Take torsemide 80 qam/40 qpm x 2 days then back to 80 daily alternating with 60 daily.  Call us if she does not feel better.

## 2021-10-06 NOTE — Telephone Encounter (Signed)
Patient called stating that she has an increase of shortness of breath. Her weight is only up 1-2 pounds but says she feels bloated. She reports that her symptoms started about 2-3 days ago and she hasnt missed any of her lasix doses. Please advise

## 2021-10-20 ENCOUNTER — Other Ambulatory Visit (HOSPITAL_COMMUNITY): Payer: Self-pay

## 2021-10-20 MED ORDER — POTASSIUM CHLORIDE ER 10 MEQ PO TBCR: 40.0000 meq | EXTENDED_RELEASE_TABLET | Freq: Every day | ORAL | 5 refills | Status: DC

## 2021-11-12 ENCOUNTER — Telehealth (HOSPITAL_COMMUNITY): Payer: Self-pay | Admitting: Surgery

## 2021-11-12 ENCOUNTER — Encounter (HOSPITAL_COMMUNITY): Payer: Self-pay | Admitting: Cardiology

## 2021-11-12 ENCOUNTER — Ambulatory Visit (HOSPITAL_COMMUNITY)
Admission: RE | Admit: 2021-11-12 | Discharge: 2021-11-12 | Disposition: A | Discharge status: Home / Self Care | Payer: Medicare Other | Source: Ambulatory Visit | Attending: Cardiology | Admitting: Cardiology

## 2021-11-12 VITALS — BP 176/72 | HR 78 | Wt 132.0 lb

## 2021-11-12 DIAGNOSIS — I350 Nonrheumatic aortic (valve) stenosis: Secondary | ICD-10-CM | POA: Insufficient documentation

## 2021-11-12 DIAGNOSIS — N183 Chronic kidney disease, stage 3 unspecified: Secondary | ICD-10-CM | POA: Diagnosis not present

## 2021-11-12 DIAGNOSIS — Z7901 Long term (current) use of anticoagulants: Secondary | ICD-10-CM | POA: Diagnosis not present

## 2021-11-12 DIAGNOSIS — I130 Hypertensive heart and chronic kidney disease with heart failure and stage 1 through stage 4 chronic kidney disease, or unspecified chronic kidney disease: Secondary | ICD-10-CM | POA: Diagnosis not present

## 2021-11-12 DIAGNOSIS — I5032 Chronic diastolic (congestive) heart failure: Secondary | ICD-10-CM | POA: Insufficient documentation

## 2021-11-12 DIAGNOSIS — I272 Pulmonary hypertension, unspecified: Secondary | ICD-10-CM

## 2021-11-12 DIAGNOSIS — R42 Dizziness and giddiness: Secondary | ICD-10-CM | POA: Diagnosis not present

## 2021-11-12 DIAGNOSIS — Z8616 Personal history of COVID-19: Secondary | ICD-10-CM | POA: Diagnosis not present

## 2021-11-12 DIAGNOSIS — J439 Emphysema, unspecified: Secondary | ICD-10-CM | POA: Diagnosis not present

## 2021-11-12 DIAGNOSIS — I482 Chronic atrial fibrillation, unspecified: Secondary | ICD-10-CM | POA: Diagnosis not present

## 2021-11-12 DIAGNOSIS — Z8673 Personal history of transient ischemic attack (TIA), and cerebral infarction without residual deficits: Secondary | ICD-10-CM | POA: Insufficient documentation

## 2021-11-12 DIAGNOSIS — Z79899 Other long term (current) drug therapy: Secondary | ICD-10-CM | POA: Diagnosis not present

## 2021-11-12 DIAGNOSIS — I2721 Secondary pulmonary arterial hypertension: Secondary | ICD-10-CM | POA: Diagnosis not present

## 2021-11-12 LAB — BASIC METABOLIC PANEL
Anion gap: 12 (ref 5–15)
BUN: 26 mg/dL — ABNORMAL HIGH (ref 8–23)
CO2: 25 mmol/L (ref 22–32)
Calcium: 9.8 mg/dL (ref 8.9–10.3)
Chloride: 103 mmol/L (ref 98–111)
Creatinine, Ser: 1.35 mg/dL — ABNORMAL HIGH (ref 0.44–1.00)
GFR, Estimated: 37 mL/min — ABNORMAL LOW (ref >60)
Glucose, Bld: 95 mg/dL (ref 70–99)
Potassium: 4 mmol/L (ref 3.5–5.1)
Sodium: 140 mmol/L (ref 135–145)

## 2021-11-12 LAB — CBC
HCT: 30.3 % — ABNORMAL LOW (ref 36.0–46.0)
Hemoglobin: 9.4 g/dL — ABNORMAL LOW (ref 12.0–15.0)
MCH: 30.1 pg (ref 26.0–34.0)
MCHC: 31 g/dL (ref 30.0–36.0)
MCV: 97.1 fL (ref 80.0–100.0)
Platelets: 226 10*3/uL (ref 150–400)
RBC: 3.12 MIL/uL — ABNORMAL LOW (ref 3.87–5.11)
RDW: 14.4 % (ref 11.5–15.5)
WBC: 6.1 10*3/uL (ref 4.0–10.5)
nRBC: 0 % (ref 0.0–0.2)

## 2021-11-12 LAB — BRAIN NATRIURETIC PEPTIDE: B Natriuretic Peptide: 350 pg/mL — ABNORMAL HIGH (ref 0.0–100.0)

## 2021-11-12 NOTE — Telephone Encounter (Signed)
I attempted to reach patient regarding results and recommendations per provider.  I left a message and requested a call back. 

## 2021-11-12 NOTE — Progress Notes (Signed)
[facility] Note   Date:  11/12/2021   ID:  Rachael Myers, DOB 08-10-1929, MRN [id number]  Provider location: [place] [facility] Type of Visit: Established patient  PCP:  Dan Maker., MD  HF Cardiologist:  Dr. Shirlee Latch   HPI: Rachael Myers is a 88 y.o. female who has a history of chronic diastolic CHF, chronic atrial fibrillation, and prominent exertional dyspnea.  She has had atrial fibrillation long-term and has failed Multaq and propafenone use.  She is in atrial fibrillation chronically now.  In 1/16, she developed PNA.  She had a parapneumonic effusion that required 4 thoracenteses.  Eventually, she had VATS with decortication and a wedge resection.  Cytology was negative from the pleural fluid.  Last chest CT was at [facility] in 8/16 and showed no PE, mild interstitial edema.  Last echo was done in 11/16, showing normal LV EF and apparently normal RV EF.  PA systolic pressure estimation was moderate to severely increased.  I took her for RHC in 12/16, this showed moderate pulmonary arterial hypertension.  PFTs showed a restrictive pattern concerning for interstitial lung disease.  V/Q scan in 12/16 was not suggestive of chronic PE.  Despite abnormal PFTs, high resolution CT did not show interstitial lung disease.     At a prior appointment, started her on Opsumit.  She developed a cough and mouth soreness with this medication and had to stop it after about 7 days.  Symptoms resolved when she stopped it.  Tried to get Adcirca for her.  This was denied by her insurance company, so she was started on Revatio 20 mg tid instead.  She thinks that this has helped her breathing "a little."  She saw a pulmonary specialist who thought dyspnea was likely due to [facility].  Next, tried her on ambrisentan, but she developed facial swelling on ambrisentan (significant around the eyes).  She also developed worsening dyspnea.  She stopped ambrisentan and the swelling  resolved but she was noted to be volume overloaded.  Switched her Lasix to torsemide.  She was started on selexipag which she has tolerated.    She had a difficult winter with episode of PNA in 2/20.  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 There was moderate emphysema though she never smoked.  She says that her pulmonologist in [facility] followed up on her CT with a PET scan that was normal.   She had a COVID-19 infection in 12/20 but appears to have recovered well.   Echo in 5/21 showed EF 60-65%, mild LVH, PASP 33 mmHg, normal RV, mild MR, mild-moderate AS with mean gradient 14 mmHg, AVA 1.23 cm^2.  PYP scan in 5/21 was not suggestive of TTR amyloidosis.   [name] and had mastectomy (no chemo or radiation).   Echo in 5/22 showed EF 55-60%, mildly decreased RV systolic function, PASP 47, mild MR, mild AS mean gradient 10 mmHg.   She had a fall in 11/22 at home, seems to have been mechanical.  She developed a scalp hematoma and bruising on her arm.  Xarelto was held.  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  Xarelto was restarted.  She was sent to rehab for 3 wks and was not given her torsemide.    Patient was admitted twice at the hospital in [facility] in 4/23, once  with diastolic CHF and once with ?TIA.  Workup was negative for recurrent CVA.  Echo in 4/23 showed EF 55-60%, mild LVH, mild RV dilation with decreased systolic function, mild AS with mean gradient 15 mmHg, PASP 33 mmHg.  She stopped Jardiance, does not feel like she could tolerate it.   Echo in 8/23 showed EF 55-60%, mild LVH, normal RV, PASP 42 mmHg, dilated IVC.   Today she returns for HF followup. Weight is up 6 lbs.  She has stopped Toprol XL and orthostatic symptoms have resolved.  Atrial fibrillation rate remains controlled. She is doing better overall. Using cane.  No dyspnea walking on flat ground.  Able to do housework with no problems. No chest pain. No orthopnea/PND. SBP runs 110s-150s at home.   ECG (personally reviewed): atrial fibrillation, LAFB, LVH  Labs (9/16): K 4, creatinine 1.31, BNP 466 Labs (11/16): BNP 577 Labs (12/16); K 4, creatinine 1.3, normal rheumatoid factor and scleroderma serologies Labs (1/17): ANA positive but only 1:80 titer, dsDNA negative, SSA/B negative, CCP negative.  Labs (2/17): K 4.1, creatinine 1.48 => 1.42, BNP 487 Labs (3/17): K 4.2, creatinine 1.58, BNP 326 Labs (7/17): K 3.3, creatinine 1.7, hgb 10.7 Labs (8/17): K 3.9, creatinine 1.87, HCT 35.6 Labs (1/18): hgb 9.9, creatinine 1.42 Labs (4/18): TSH normal, hgb 10.5, K 3.8, creatinine 1.34 Labs (6/18): BNP 415, K 4.9, creatinine 1.45 Labs (1/19): K 4.4, creatinine 1.56, hgb 11.3 Labs (7/19): K 4, creatinine 1.5, BNP 381 Labs (1/20): K 3.8, creatinine 1.38 Labs (11/20): K 3.8, creatinine 1.59 Labs (5/21): K 3.8, creatinine 1.4, myeloma panel negative, urine immunofixation negative Labs (8/21): BNP 319, hgb 10.2, K 4.4, creatinine 1.7 Labs (5/22): LDL 87, TSH normal, hgb 12.6, K 3.9, creatinine 1.26 Labs (11/22): LDL 72 Labs (12/22): K 3.7, creatinine 1.59 Labs (1/23): K 3.7, creatinine 1.30 Labs (3/23): K 3.6, creatinine 1.33 Labs (4/23): K 4, creatinine 1.31, LDL 76, HDL 45 Labs (6/23): K 3.3, creatinine 1.46, BNP 390 Labs (7/23): K 4.2, creaitnine 1.7 Labs (9/23): K 3.9, creatinine 1.4   PMH: 1. Cardiolite (9/16) with EF 54%, no ischemia/infarction. 2. Chronic diastolic CHF: Echo (11/16) with EF 55-60%, normal RV size and systolic function, PA systolic pressure 66 mmHg.  - Echo (7/17) with EF 60-65%, mildly dilated RV with mildly decreased systolic function, PA systolic pressure 36 mmHg.  - Echo (7/18) with EF 55-60%, mild to moderate AI, normal RV size and systolic function, PASP 43 mmHg.  - Echo (1/20) with EF 55-60%, normal RV size and systolic  function, mild AI, mild MR.  - Echo (5/21) witih EF 60-65%, mild LVH, PASP 33 mmHg, normal RV, mild MR, mild-moderate AS with mean gradient 14 mmHg, AVA 1.23 cm^2.  - PYP scan (5/21): grade 0, H/CL 1.1 (not suggestive of TTR amyloidosis).  - Echo (5/22): EF 55-60%, mildly decreased RV systolic function, PASP 47, mild MR, mild AS mean gradient 10 mmHg. - Echo (4/23): EF 55-60%, mild LVH, mild RV dilation with decreased systolic function, mild AS with mean gradient 15 mmHg, PASP 33 mmHg. - Echo (8/23): EF 55-60%, mild LVH, normal RV, PASP 42 mmHg, dilated IVC 3. Chronic atrial fibrillation: She has been intolerant of Multaq and propafenone.  Sotalol and flecainide have not been used due to CKD.   4. SBO: Managed conservatively. 5. CKD stage 3 6. OA: h/o THR and TKR.  7. H/o hyponatremia 8. Pneumonia with parapneumonic effusion with thoracenteses and eventual VATS and decortication.  9. Pulmonary hypertension: Suspect  Group 1 pulmonary hypertension.  RHC (12/16) with mean RA 5, PA 62/22 mean 40, mean PCWP 19, CI 2.46, PVR 5 WU.  PFTs (12/16) wiith FVC 71%, FEV1 73%, ratio 104%, TLC 71%, DLCO 53% => moderate restriction concerning for interstitial lung disease.  High resolution CT (12/16) did not show evidence for interstitial lung disease.  V/Q scan (12/16) did not show evidence for chronic PE.  Serologies: RF negative, scleroderma antibodies negative, ANA positive but only 1:80 titer, SSA/B negative, dsDNA negative, CCP negative.  She did not tolerate macitentan or ambrisentan.  Echo 7/17 with PA systolic pressure down to 36 mmHg.   - 6 minute walk (12/16) with 244 meters - 6 minute walk (2/17) with 232 meters - 6 minute walk (3/17) with 414 meters - 6 minute walk (4/17) with 267 meters - 6 minute walk (8/17) with 241 meters - 6 minute walk (10/17) with 256 meters - 6 minute walk (6/18) with 305 meters - 6 minute walk (9/18) with 320 meters - 6 minute walk (1/19) with 213 meters - 6 minute walk  (7/19) with 335 meters - 6 minute walk (1/20) with 213 meters - 6 minute walk (10/20) with 243 meters - 6 minute walk (2/21) with 243 meters - 6 minute walk (8/21) with 214 meters - 6 minute walk (2/22) with 260 meters - 6 minute walk (7/22) with 274 meters 10. Lightheadedness/syncope: Extensive workup.  Event and holter monitors as well as telemetry monitoring negative.  She has not been orthostatic.  - Did not tolerate pyridostigmine due to hot flashes.  11. Sciatica: Status post back surgery 1/18.  12. COVID-19 infection 12/20.  13. Peripheral neuropathy 14. Aortic stenosis: Mild to moderate on 5/21 echo.  - Mild on 4/23 echo.  15. Breast cancer: Triple negative on left, s/p mastectomy in 7/21.  16. CVA: 11/22, occurred while off anticoagulation.  - Carotid dopplers (11/22) showed 1-39% BICA stenosis.    Current Outpatient Medications  Medication Sig Dispense Refill   amoxicillin (AMOXIL) 500 MG capsule As needed before dental procedures     aspirin 81 MG chewable tablet Chew 81 mg by mouth daily.     potassium chloride (KLOR-CON) 10 MEQ tablet Take 4 tablets (40 mEq total) by mouth daily. 120 tablet 5   PREDNISOLONE ACETATE OP Apply 1 drop to eye in the morning, at noon, in the evening, and at bedtime. Left eye     REVATIO 20 MG tablet TAKE 4 TABLETS THREE TIMES A DAY 1080 tablet 3   torsemide (DEMADEX) 20 MG tablet Take 4 tablets (80 mg total) by mouth daily. Alternating with 60mg  daily 90 tablet 5   UPTRAVI 1000 MCG TABS Take 1 tablet by mouth 2 (two) times daily. 180 tablet 3   XARELTO 15 MG TABS tablet TAKE 1 TABLET DAILY WITH SUPPER 90 tablet 3   No current facility-administered medications for this encounter.   Allergies:   Acetaminophen, Hydrocodone-acetaminophen, Letairis [ambrisentan], Opsumit [macitentan], Ace inhibitors, Atenolol, Hctz [hydrochlorothiazide], Oxycodone, and Diltiazem hcl   Social History:  The patient  reports that she has never smoked. She has never  used smokeless tobacco. She reports that she does not drink alcohol and does not use drugs.   Family History:  The patient's family history includes Cancer in her mother.   ROS:  Please see the history of present illness.   All other systems are personally reviewed and negative.   Recent Labs: 11/12/2021: B Natriuretic Peptide 350.0; BUN 26; Creatinine, Ser 1.35;  Hemoglobin 9.4; Platelets 226; Potassium 4.0; Sodium 140  Personally reviewed   Wt Readings from Last 3 Encounters:  11/12/21 59.9 kg (132 lb)  08/07/21 57.2 kg (126 lb 3.2 oz)  07/02/21 62.1 kg (137 lb)   Physical Exam:   BP (!) 176/72   Pulse 78   Wt 59.9 kg (132 lb)   SpO2 98%   BMI 20.67 kg/m  General: NAD Neck: JVP 8 cm, no thyromegaly or thyroid nodule.  Lungs: Clear to auscultation bilaterally with normal respiratory effort. CV: Nondisplaced PMI.  Heart irregular S1/S2, no S3/S4, 2/6 early SEM RUSB with clear S2.  No peripheral edema.  No carotid bruit.  Normal pedal pulses.  Abdomen: Soft, nontender, no hepatosplenomegaly, no distention.  Skin: Intact without lesions or rashes.  Neurologic: Alert and oriented x 3.  Psych: Normal affect. Extremities: No clubbing or cyanosis.  HEENT: Normal.   Assessment & Plan: 1. Chronic diastolic CHF:  Echo in 8/23 showed EF 55-60%, mild LVH, normal RV, PASP 42 mmHg, dilated IVC.  She is improved, NYHA class II with no volume overload on exam.  - Continue torsemide 80 mg daily alternating with 60 mg daily. BMET/BNP today  - She did not tolerate Jardiance.   - Continue compression stockings.  2. Pulmonary hypertension: Moderate to severe pulmonary hypertension by echo (PASP 66 mmHg). RHC showed moderate pulmonary arterial hypertension with PVR 5 WU.  PFTs with restrictive changes suggestive of interstitial process but high resolution CT did not show interstitial lung disease.  No evidence for chronic PE on V/Q scan.  Rheumatologic serologies all negative.  Possible group 1  pulmonary hypertension.  She was unable to tolerate macitentan or ambrisentan.  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  She is now on sildenafil 80 mg tid and selexipag 1000 mcg bid, unable to increase selexipag further due to symptoms.  Echo in 8/23showed normal RV and mildly elevated PA pressure.  - Continue Selexipag at 1000 mcg bid, unable to tolerate uptitration of Selexipag any further.  - Continue sildenafil 80 mg tid.   3. Pulmonary: s/p PNA with parapneumonic effusion and eventual VATS with decortication.  As above, restrictive PFTs but no ILD on high resolution CT.  CT in 4/20 showed a concerning RLL nodule and emphysema, though she never smoked. Her pulmonologist ordered a PET scan after this per her report, and PET was normal.  4. Atrial fibrillation: Chronic. She is off Toprol XL due to lightheadedness and HR is under reasonable control.  - Continue Xarelto 15 mg daily.  5. HTN: BP elevated in the office today.  Home readings are normal to mildly elevated.  - Continue to wear compression stockings.  - Due to fall risk, I will not add anti-hypertensives today.  6. CKD: Stage 3.  BMET today.  7. Aortic stenosis: Minimal on 8/23 echo.  8. CVA: She had a stroke in 11/22 while off Xarelto.  Carotid dopplers were unremarkable.  Her stroke was atrial fibrillation-related. She needs to stay on Xarelto if at all possible.  - Currently off atorvastatin, needs to restart.   Followup in 4 months.   Signed, [name], MD  11/12/2021  [facility] [place] [address] [facility] [place] [address] [phone] (office) [phone] (fax)

## 2021-11-12 NOTE — Telephone Encounter (Signed)
-----   Message from Laurey Morale, MD sent at 11/12/2021  3:32 PM EST ----- Hgb lower.  Would have her followup on this with PCP. Make sure no overt bleeding.

## 2021-11-12 NOTE — Patient Instructions (Signed)
There has been no changes to your medications.  Labs done today, your results will be available in MyChart, we will contact you for abnormal readings.  Your physician recommends that you schedule a follow-up appointment in: 6 months ( May 2024)  ** please call the office in February to arrange your follow up appointment **  If you have any questions or concerns before your next appointment please send us a message through mychart or call our office at [phone].    TO LEAVE A MESSAGE FOR THE NURSE SELECT OPTION 2, PLEASE LEAVE A MESSAGE INCLUDING: YOUR NAME DATE OF BIRTH CALL BACK NUMBER REASON FOR CALL**this is important as we prioritize the call backs  YOU WILL RECEIVE A CALL BACK THE SAME DAY AS LONG AS YOU CALL BEFORE 4:00 PM  At the [facility], you and your health needs are our priority. As part of our continuing mission to provide you with exceptional heart care, we have created designated Provider Care Teams. These Care Teams include your primary Cardiologist (physician) and Advanced Practice Providers (APPs- Physician Assistants and Nurse Practitioners) who all work together to provide you with the care you need, when you need it.   You may see any of the following providers on your designated Care Team at your next follow up: Dr Daniel Bensimhon Dr Dalton McLean Dr. Aditya Sabharwal Amy Clegg, NP Brittainy Simmons, PA Jessica Milford,NP Lindsay Finch, PA Alma Diaz, NP Lauren Kemp, PharmD   Please be sure to bring in all your medications bottles to every appointment.    

## 2022-01-14 ENCOUNTER — Telehealth (HOSPITAL_COMMUNITY): Payer: Self-pay

## 2022-01-14 NOTE — Telephone Encounter (Signed)
Patient called stating that she has increased shortness of breath along with fatigue,off and on chest pain, dizziness, she denies swelling, weight gain and requested a sooner appointment, appointment made for Tuesday. Please advise,

## 2022-01-15 ENCOUNTER — Telehealth (HOSPITAL_COMMUNITY): Payer: Self-pay

## 2022-01-15 NOTE — Telephone Encounter (Signed)
Caregiver called stating patient called her and said she could not breath or take deep breath. Advised care giver to get patient to go to an urgent care or emergency room to get evaluated.

## 2022-01-19 ENCOUNTER — Ambulatory Visit (HOSPITAL_COMMUNITY)
Admission: RE | Admit: 2022-01-19 | Discharge: 2022-01-19 | Disposition: A | Discharge status: Home / Self Care | Payer: Medicare Other | Source: Ambulatory Visit | Attending: Cardiology | Admitting: Cardiology

## 2022-01-19 ENCOUNTER — Encounter (HOSPITAL_COMMUNITY): Payer: Self-pay | Admitting: Cardiology

## 2022-01-19 VITALS — BP 168/68 | HR 84 | Wt 132.4 lb

## 2022-01-19 DIAGNOSIS — Z79899 Other long term (current) drug therapy: Secondary | ICD-10-CM | POA: Insufficient documentation

## 2022-01-19 DIAGNOSIS — R002 Palpitations: Secondary | ICD-10-CM | POA: Diagnosis not present

## 2022-01-19 DIAGNOSIS — I2721 Secondary pulmonary arterial hypertension: Secondary | ICD-10-CM | POA: Diagnosis not present

## 2022-01-19 DIAGNOSIS — Z7901 Long term (current) use of anticoagulants: Secondary | ICD-10-CM | POA: Diagnosis not present

## 2022-01-19 DIAGNOSIS — I350 Nonrheumatic aortic (valve) stenosis: Secondary | ICD-10-CM | POA: Insufficient documentation

## 2022-01-19 DIAGNOSIS — Z8673 Personal history of transient ischemic attack (TIA), and cerebral infarction without residual deficits: Secondary | ICD-10-CM | POA: Insufficient documentation

## 2022-01-19 DIAGNOSIS — I5032 Chronic diastolic (congestive) heart failure: Secondary | ICD-10-CM | POA: Insufficient documentation

## 2022-01-19 DIAGNOSIS — Z8616 Personal history of COVID-19: Secondary | ICD-10-CM | POA: Insufficient documentation

## 2022-01-19 DIAGNOSIS — I272 Pulmonary hypertension, unspecified: Secondary | ICD-10-CM | POA: Diagnosis not present

## 2022-01-19 DIAGNOSIS — I130 Hypertensive heart and chronic kidney disease with heart failure and stage 1 through stage 4 chronic kidney disease, or unspecified chronic kidney disease: Secondary | ICD-10-CM | POA: Insufficient documentation

## 2022-01-19 DIAGNOSIS — Z853 Personal history of malignant neoplasm of breast: Secondary | ICD-10-CM | POA: Insufficient documentation

## 2022-01-19 DIAGNOSIS — R0609 Other forms of dyspnea: Secondary | ICD-10-CM | POA: Insufficient documentation

## 2022-01-19 DIAGNOSIS — E8770 Fluid overload, unspecified: Secondary | ICD-10-CM | POA: Diagnosis not present

## 2022-01-19 DIAGNOSIS — Z9012 Acquired absence of left breast and nipple: Secondary | ICD-10-CM | POA: Diagnosis not present

## 2022-01-19 DIAGNOSIS — N183 Chronic kidney disease, stage 3 unspecified: Secondary | ICD-10-CM | POA: Insufficient documentation

## 2022-01-19 DIAGNOSIS — I482 Chronic atrial fibrillation, unspecified: Secondary | ICD-10-CM | POA: Diagnosis not present

## 2022-01-19 DIAGNOSIS — R42 Dizziness and giddiness: Secondary | ICD-10-CM | POA: Insufficient documentation

## 2022-01-19 DIAGNOSIS — R0602 Shortness of breath: Secondary | ICD-10-CM | POA: Diagnosis not present

## 2022-01-19 LAB — BASIC METABOLIC PANEL
Anion gap: 8 (ref 5–15)
BUN: 25 mg/dL — ABNORMAL HIGH (ref 8–23)
CO2: 26 mmol/L (ref 22–32)
Calcium: 9.1 mg/dL (ref 8.9–10.3)
Chloride: 102 mmol/L (ref 98–111)
Creatinine, Ser: 1.36 mg/dL — ABNORMAL HIGH (ref 0.44–1.00)
GFR, Estimated: 37 mL/min — ABNORMAL LOW (ref >60)
Glucose, Bld: 99 mg/dL (ref 70–99)
Potassium: 3.5 mmol/L (ref 3.5–5.1)
Sodium: 136 mmol/L (ref 135–145)

## 2022-01-19 LAB — BRAIN NATRIURETIC PEPTIDE: B Natriuretic Peptide: 435.7 pg/mL — ABNORMAL HIGH (ref 0.0–100.0)

## 2022-01-19 MED ORDER — TORSEMIDE 20 MG PO TABS: 80.0000 mg | ORAL_TABLET | Freq: Every day | ORAL | 5 refills | Status: DC

## 2022-01-19 MED ORDER — POTASSIUM CHLORIDE ER 10 MEQ PO TBCR: 40.0000 meq | EXTENDED_RELEASE_TABLET | Freq: Every day | ORAL | 5 refills | Status: DC

## 2022-01-19 NOTE — Patient Instructions (Signed)
CHANGE Torsemide to 80mg  in the morning and 40 mg in the evening for 3 days, then take 80mg  daily after that.  CHANGE Potassium to 40 mEq ( 4 tabs) in the morning and 20 mEq ( 2 tabs) in the evening.  Labs done today, your results will be available in MyChart, we will contact you for abnormal readings.  Repeat blood work in 10 days.  Your physician recommends that you schedule a follow-up appointment in: 3 weeks  If you have any questions or concerns before your next appointment please send [place] a message through [place] or call our office at [phone].    TO LEAVE A MESSAGE FOR THE NURSE SELECT OPTION 2, PLEASE LEAVE A MESSAGE INCLUDING: YOUR NAME DATE OF BIRTH CALL BACK NUMBER REASON FOR CALL**this is important as we prioritize the call backs  YOU WILL RECEIVE A CALL BACK THE SAME DAY AS LONG AS YOU CALL BEFORE 4:00 PM  At the [facility], you and your health needs are our priority. As part of our continuing mission to provide you with exceptional heart care, we have created designated Provider Care Teams. These Care Teams include your primary Cardiologist (physician) and Advanced Practice Providers (APPs- Physician Assistants and Nurse Practitioners) who all work together to provide you with the care you need, when you need it.   You may see any of the following providers on your designated Care Team at your next follow up: Dr Glori Bickers Dr Loralie Champagne Dr. Roxana Hires, NP Lyda Jester, [place] [facility] [place], [place] Forestine Na, NP Audry Riles, PharmD   Please be sure to bring in all your medications bottles to every appointment.

## 2022-01-19 NOTE — Progress Notes (Signed)
ReDS Vest / Clip - 01/19/22 1200       ReDS Vest / Clip   Station Marker C    Ruler Value 25    ReDS Value Range Moderate volume overload    ReDS Actual Value 39

## 2022-01-19 NOTE — Progress Notes (Signed)
Advanced Heart Failure Clinic Note   Date:  01/19/2022   ID:  Rachael Myers, DOB July 31, 1929, MRN [id number]  Provider location: [place] [facility] Type of Visit: Established patient  PCP:  Myrtis Hopping., MD  HF Cardiologist:  Dr. Aundra Dubin   HPI: Rachael Myers is a 89 y.o. female who has a history of chronic diastolic CHF, chronic atrial fibrillation, and prominent exertional dyspnea.  She has had atrial fibrillation long-term and has failed Multaq and propafenone use.  She is in atrial fibrillation chronically now.  In 1/16, she developed PNA.  She had a parapneumonic effusion that required 4 thoracenteses.  Eventually, she had VATS with decortication and a wedge resection.  Cytology was negative from the pleural fluid.  Last chest CT was at [facility] in 8/16 and showed no PE, mild interstitial edema.  Last echo was done in 11/16, showing normal LV EF and apparently normal RV EF.  PA systolic pressure estimation was moderate to severely increased.  I took her for [place] in 12/16, this showed moderate pulmonary arterial hypertension.  PFTs showed a restrictive pattern concerning for interstitial lung disease.  V/Q scan in 12/16 was not suggestive of chronic PE.  Despite abnormal PFTs, high resolution CT did not show interstitial lung disease.     At a prior appointment, started her on Opsumit.  She developed a cough and mouth soreness with this medication and had to stop it after about 7 days.  Symptoms resolved when she stopped it.  Tried to get Adcirca for her.  This was denied by her insurance company, so she was started on Revatio 20 mg tid instead.  She thinks that this has helped her breathing "a little."  She saw a pulmonary specialist who thought dyspnea was likely due to [place].  Next, tried her on ambrisentan, but she developed facial swelling on ambrisentan (significant around the eyes).  She also developed worsening dyspnea.  She stopped ambrisentan and the swelling  resolved but she was noted to be volume overloaded.  Switched her Lasix to torsemide.  She was started on selexipag which she has tolerated.    She had a difficult winter with episode of PNA in 2/20.  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 There was moderate emphysema though she never smoked.  She says that her pulmonologist in [facility] followed up on her CT with a PET scan that was normal.   She had a COVID-19 infection in 12/20 but appears to have recovered well.   Echo in 5/21 showed EF 60-65%, mild LVH, PASP 33 mmHg, normal RV, mild MR, mild-moderate AS with mean gradient 14 mmHg, AVA 1.23 cm^2.  PYP scan in 5/21 was not suggestive of TTR amyloidosis.   [name] and had mastectomy (no chemo or radiation).   Echo in 5/22 showed EF 55-60%, mildly decreased RV systolic function, PASP 47, mild MR, mild AS mean gradient 10 mmHg.   She had a fall in 11/22 at home, seems to have been mechanical.  She developed a scalp hematoma and bruising on her arm.  Xarelto was held.  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  Xarelto was restarted.  She was sent to rehab for 3 wks and was not given her torsemide.    Patient was admitted twice at the hospital in [facility] in 4/23, once  with diastolic CHF and once with ?TIA.  Workup was negative for recurrent CVA.  Echo in 4/23 showed EF 55-60%, mild LVH, mild RV dilation with decreased systolic function, mild AS with mean gradient 15 mmHg, PASP 33 mmHg.  She stopped Jardiance, does not feel like she could tolerate it.   Echo in 8/23 showed EF 55-60%, mild LVH, normal RV, PASP 42 mmHg, dilated IVC.   Today she returns for HF followup. Weight is stable.  At last appointment, she was doing well with NYHA class II symptoms, but today she is doing significantly worse.  She has been short of breath for about 2  wks.  She dyspneic walking around the house and with dressing, showering, and other ADLs.  No chest pain.  She does have orthopnea. Occasional lightheadedness with prolonged standing. No cough, fever, or wheezing. Occasional palpitations.   ECG (personally reviewed): atrial fibrillation, LVH, LAFB  REDs clip 39%  Labs (9/16): K 4, creatinine 1.31, BNP 466 Labs (11/16): BNP 577 Labs (12/16); K 4, creatinine 1.3, normal rheumatoid factor and scleroderma serologies Labs (1/17): ANA positive but only 1:80 titer, dsDNA negative, SSA/B negative, CCP negative.  Labs (2/17): K 4.1, creatinine 1.48 => 1.42, BNP 487 Labs (3/17): K 4.2, creatinine 1.58, BNP 326 Labs (7/17): K 3.3, creatinine 1.7, hgb 10.7 Labs (8/17): K 3.9, creatinine 1.87, HCT 35.6 Labs (1/18): hgb 9.9, creatinine 1.42 Labs (4/18): TSH normal, hgb 10.5, K 3.8, creatinine 1.34 Labs (6/18): BNP 415, K 4.9, creatinine 1.45 Labs (1/19): K 4.4, creatinine 1.56, hgb 11.3 Labs (7/19): K 4, creatinine 1.5, BNP 381 Labs (1/20): K 3.8, creatinine 1.38 Labs (11/20): K 3.8, creatinine 1.59 Labs (5/21): K 3.8, creatinine 1.4, myeloma panel negative, urine immunofixation negative Labs (8/21): BNP 319, hgb 10.2, K 4.4, creatinine 1.7 Labs (5/22): LDL 87, TSH normal, hgb 12.6, K 3.9, creatinine 1.26 Labs (11/22): LDL 72 Labs (12/22): K 3.7, creatinine 1.59 Labs (1/23): K 3.7, creatinine 1.30 Labs (3/23): K 3.6, creatinine 1.33 Labs (4/23): K 4, creatinine 1.31, LDL 76, HDL 45 Labs (6/23): K 3.3, creatinine 1.46, BNP 390 Labs (7/23): K 4.2, creaitnine 1.7 Labs (9/23): K 3.9, creatinine 1.4 Labs (11/23): BNP 350 Labs (12/23): K 3.7, creatinine 1.44   PMH: 1. Cardiolite (9/16) with EF 54%, no ischemia/infarction. 2. Chronic diastolic CHF: Echo (11/16) with EF 55-60%, normal RV size and systolic function, PA systolic pressure 66 mmHg.  - Echo (7/17) with EF 60-65%, mildly dilated RV with mildly decreased systolic function, PA systolic  pressure 36 mmHg.  - Echo (7/18) with EF 55-60%, mild to moderate AI, normal RV size and systolic function, PASP 43 mmHg.  - Echo (1/20) with EF 55-60%, normal RV size and systolic function, mild AI, mild MR.  - Echo (5/21) witih EF 60-65%, mild LVH, PASP 33 mmHg, normal RV, mild MR, mild-moderate AS with mean gradient 14 mmHg, AVA 1.23 cm^2.  - PYP scan (5/21): grade 0, H/CL 1.1 (not suggestive of TTR amyloidosis).  - Echo (5/22): EF 55-60%, mildly decreased RV systolic function, PASP 47, mild MR, mild AS mean gradient 10 mmHg. - Echo (4/23): EF 55-60%, mild LVH, mild RV dilation with decreased systolic function, mild AS with mean gradient 15 mmHg, PASP 33 mmHg. - Echo (8/23): EF 55-60%, mild LVH, normal RV, PASP 42 mmHg, dilated IVC 3. Chronic atrial fibrillation: She has been intolerant of Multaq and propafenone.  Sotalol and flecainide have not been used due to CKD.   4. SBO: Managed conservatively. 5.  CKD stage 3 6. OA: h/o THR and TKR.  7. H/o hyponatremia 8. Pneumonia with parapneumonic effusion with thoracenteses and eventual VATS and decortication.  9. Pulmonary hypertension: Suspect Group 1 pulmonary hypertension.  RHC (12/16) with mean RA 5, PA 62/22 mean 40, mean PCWP 19, CI 2.46, PVR 5 WU.  PFTs (12/16) wiith FVC 71%, FEV1 73%, ratio 104%, TLC 71%, DLCO 53% => moderate restriction concerning for interstitial lung disease.  High resolution CT (12/16) did not show evidence for interstitial lung disease.  V/Q scan (12/16) did not show evidence for chronic PE.  Serologies: RF negative, scleroderma antibodies negative, ANA positive but only 1:80 titer, SSA/B negative, dsDNA negative, CCP negative.  She did not tolerate macitentan or ambrisentan.  Echo [id number] with PA systolic pressure down to 36 mmHg.   - 6 minute walk (12/16) with 244 meters - 6 minute walk (2/17) with 232 meters - 6 minute walk (3/17) with 414 meters - 6 minute walk (4/17) with 267 meters - 6 minute walk (8/17) with 241  meters - 6 minute walk (10/17) with 256 meters - 6 minute walk (6/18) with 305 meters - 6 minute walk (9/18) with 320 meters - 6 minute walk (1/19) with 213 meters - 6 minute walk (7/19) with 335 meters - 6 minute walk (1/20) with 213 meters - 6 minute walk (10/20) with 243 meters - 6 minute walk (2/21) with 243 meters - 6 minute walk (8/21) with 214 meters - 6 minute walk (2/22) with 260 meters - 6 minute walk (7/22) with 274 meters 10. Lightheadedness/syncope: Extensive workup.  Event and holter monitors as well as telemetry monitoring negative.  She has not been orthostatic.  - Did not tolerate pyridostigmine due to hot flashes.  11. Sciatica: Status post back surgery 1/18.  12. COVID-19 infection 12/20.  13. Peripheral neuropathy 14. Aortic stenosis: Mild to moderate on 5/21 echo.  - Mild on 4/23 echo.  15. Breast cancer: Triple negative on left, s/p mastectomy in 7/21.  16. CVA: 11/22, occurred while off anticoagulation.  - Carotid dopplers (11/22) showed 1-39% BICA stenosis.    Current Outpatient Medications  Medication Sig Dispense Refill   amoxicillin (AMOXIL) 500 MG capsule As needed before dental procedures     Ferrous Sulfate (IRON) 325 (65 Fe) MG TABS Take 1 tablet by mouth daily.     pantoprazole (PROTONIX) 40 MG tablet Take 40 mg by mouth daily.     PREDNISOLONE ACETATE OP Apply 1 drop to eye in the morning, at noon, in the evening, and at bedtime. Left eye     REVATIO 20 MG tablet TAKE 4 TABLETS THREE TIMES A DAY 1080 tablet 3   UPTRAVI 1000 MCG TABS Take 1 tablet by mouth 2 (two) times daily. 180 tablet 3   XARELTO 15 MG TABS tablet TAKE 1 TABLET DAILY WITH SUPPER 90 tablet 3   potassium chloride (KLOR-CON) 10 MEQ tablet Take 4 tablets (40 mEq total) by mouth daily. Take 2 tablets (62mEq) every evening 120 tablet 5   torsemide (DEMADEX) 20 MG tablet Take 4 tablets (80 mg total) by mouth daily. 90 tablet 5   No current facility-administered medications for this  encounter.   Allergies:   Acetaminophen, Hydrocodone-acetaminophen, Letairis [ambrisentan], Opsumit [macitentan], Ace inhibitors, Atenolol, Hctz [hydrochlorothiazide], Oxycodone, and Diltiazem hcl   Social History:  The patient  reports that she has never smoked. She has never used smokeless tobacco. She reports that she does not drink alcohol and does not use  drugs.   Family History:  The patient's family history includes Cancer in her mother.   ROS:  Please see the history of present illness.   All other systems are personally reviewed and negative.   Recent Labs: 11/12/2021: Hemoglobin 9.4; Platelets 226 01/19/2022: B Natriuretic Peptide 435.7; BUN 25; Creatinine, Ser 1.36; Potassium 3.5; Sodium 136  Personally reviewed   Wt Readings from Last 3 Encounters:  01/19/22 60.1 kg (132 lb 6.4 oz)  11/12/21 59.9 kg (132 lb)  08/07/21 57.2 kg (126 lb 3.2 oz)   Physical Exam:   BP (!) 168/68   Pulse 84   Wt 60.1 kg (132 lb 6.4 oz)   SpO2 98%   BMI 20.74 kg/m  General: NAD Neck: JVP 8-9 cm with HJR, no thyromegaly or thyroid nodule.  Lungs: Clear to auscultation bilaterally with normal respiratory effort. CV: Nondisplaced PMI.  Heart regular S1/S2, no S3/S4, 2/6 early SEM RUSB.  No peripheral edema.  No carotid bruit.  Normal pedal pulses.  Abdomen: Soft, nontender, no hepatosplenomegaly, no distention.  Skin: Intact without lesions or rashes.  Neurologic: Alert and oriented x 3.  Psych: Normal affect. Extremities: No clubbing or cyanosis.  HEENT: Normal.   Assessment & Plan: 1. Chronic diastolic CHF:  Echo in 99991111 showed EF 55-60%, mild LVH, normal RV, PASP 42 mmHg, dilated IVC.  She is volume overloaded on exam today with NYHA class III symptoms.  REDS clip elevated at 39%. - Increase torsemide to 80 qam/40 qpm x 3 days then 80 mg daily after that.  BMET/BNP today, BMET 10 days.   - She did not tolerate Jardiance.   - Continue compression stockings.  2. Pulmonary hypertension:  Moderate to severe pulmonary hypertension by echo (PASP 66 mmHg). RHC showed moderate pulmonary arterial hypertension with PVR 5 WU.  PFTs with restrictive changes suggestive of interstitial process but high resolution CT did not show interstitial lung disease.  No evidence for chronic PE on V/Q scan.  Rheumatologic serologies all negative.  Possible group 1 pulmonary hypertension.  She was unable to tolerate macitentan or ambrisentan.  Insurance would not approve Engineer, site.  She is now on sildenafil 80 mg tid and selexipag 1000 mcg bid, unable to increase selexipag further due to symptoms.  Echo in 8/23 showed normal RV and mildly elevated PA pressure.  - Continue Selexipag at 1000 mcg bid, unable to tolerate uptitration of Selexipag any further.  - Continue sildenafil 80 mg tid.   3. Pulmonary: s/p PNA with parapneumonic effusion and eventual VATS with decortication.  As above, restrictive PFTs but no ILD on high resolution CT.  CT in 4/20 showed a concerning RLL nodule and emphysema, though she never smoked. Her pulmonologist ordered a PET scan after this per her report, and PET was normal.  4. Atrial fibrillation: Chronic. She is off Toprol XL due to lightheadedness and HR is under reasonable control.  - Continue Xarelto 15 mg daily. She should stop ASA with Xarelto use.  5. HTN: BP elevated in the office today.  Home readings are normal to mildly elevated.  - Continue to wear compression stockings.  - Due to fall risk, I will not add anti-hypertensives today.  6. CKD: Stage 3.  BMET today.  7. Aortic stenosis: Minimal on 8/23 echo.  8. CVA: She had a stroke in 11/22 while off Xarelto.  Carotid dopplers were unremarkable.  Her stroke was atrial fibrillation-related. She needs to stay on Xarelto if at all possible.  - Currently off  atorvastatin, needs to restart.   Followup in 3 wks with APP.   Signed, Loralie Champagne, MD  01/19/2022  [facility] [address] [facility] [place] [address] [phone] (office) [phone] (fax)

## 2022-01-21 ENCOUNTER — Other Ambulatory Visit (HOSPITAL_COMMUNITY): Payer: Self-pay | Admitting: Cardiology

## 2022-01-21 MED ORDER — POTASSIUM CHLORIDE ER 10 MEQ PO TBCR: EXTENDED_RELEASE_TABLET | ORAL | 3 refills | Status: DC

## 2022-01-29 ENCOUNTER — Ambulatory Visit (HOSPITAL_COMMUNITY)
Admission: RE | Admit: 2022-01-29 | Discharge: 2022-01-29 | Disposition: A | Discharge status: Home / Self Care | Payer: Medicare Other | Source: Ambulatory Visit | Attending: Cardiology | Admitting: Cardiology

## 2022-01-29 DIAGNOSIS — I5032 Chronic diastolic (congestive) heart failure: Secondary | ICD-10-CM | POA: Diagnosis present

## 2022-01-29 LAB — BASIC METABOLIC PANEL
Anion gap: 7 (ref 5–15)
BUN: 23 mg/dL (ref 8–23)
CO2: 25 mmol/L (ref 22–32)
Calcium: 8.9 mg/dL (ref 8.9–10.3)
Chloride: 103 mmol/L (ref 98–111)
Creatinine, Ser: 1.62 mg/dL — ABNORMAL HIGH (ref 0.44–1.00)
GFR, Estimated: 30 mL/min — ABNORMAL LOW (ref >60)
Glucose, Bld: 90 mg/dL (ref 70–99)
Potassium: 4.3 mmol/L (ref 3.5–5.1)
Sodium: 135 mmol/L (ref 135–145)

## 2022-02-09 ENCOUNTER — Encounter (HOSPITAL_COMMUNITY): Payer: Medicare Other | Admitting: Cardiology

## 2022-02-09 NOTE — Progress Notes (Incomplete)
[facility] Note   Date:  02/09/2022   ID:  Rachael Myers, DOB February 26, 1929, MRN [id number]  Provider location: [facility] [facility] Type of Visit: Established patient  PCP:  Myrtis Hopping., MD  HF Cardiologist:  Dr. Aundra Dubin   HPI: Rachael Myers is a 89 y.o. female who has a history of chronic diastolic CHF, chronic atrial fibrillation, and prominent exertional dyspnea.  She has had atrial fibrillation long-term and has failed Multaq and propafenone use.  She is in atrial fibrillation chronically now.  In 1/16, she developed PNA.  She had a parapneumonic effusion that required 4 thoracenteses.  Eventually, she had VATS with decortication and a wedge resection.  Cytology was negative from the pleural fluid.  Last chest CT was at [facility] in 8/16 and showed no PE, mild interstitial edema.  Last echo was done in 11/16, showing normal LV EF and apparently normal RV EF.  PA systolic pressure estimation was moderate to severely increased.  I took her for [place] in 12/16, this showed moderate pulmonary arterial hypertension.  PFTs showed a restrictive pattern concerning for interstitial lung disease.  V/Q scan in 12/16 was not suggestive of chronic PE.  Despite abnormal PFTs, high resolution CT did not show interstitial lung disease.     At a prior appointment, started her on Opsumit.  She developed a cough and mouth soreness with this medication and had to stop it after about 7 days.  Symptoms resolved when she stopped it.  Tried to get Adcirca for her.  This was denied by her insurance company, so she was started on Revatio 20 mg tid instead.  She thinks that this has helped her breathing "a little."  She saw a pulmonary specialist who thought dyspnea was likely due to [facility].  Next, tried her on ambrisentan, but she developed facial swelling on ambrisentan (significant around the eyes).  She also developed worsening dyspnea.  She stopped ambrisentan and the swelling  resolved but she was noted to be volume overloaded.  Switched her Lasix to torsemide.  She was started on selexipag which she has tolerated.    She had a difficult winter with episode of PNA in 2/20.  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 There was moderate emphysema though she never smoked.  She says that her pulmonologist in [place] followed up on her CT with a PET scan that was normal.   She had a COVID-19 infection in 12/20 but appears to have recovered well.   Echo in 5/21 showed EF 60-65%, mild LVH, PASP 33 mmHg, normal RV, mild MR, mild-moderate AS with mean gradient 14 mmHg, AVA 1.23 cm^2.  PYP scan in 5/21 was not suggestive of TTR amyloidosis.   [name] and had mastectomy (no chemo or radiation).   Echo in 5/22 showed EF 55-60%, mildly decreased RV systolic function, PASP 47, mild MR, mild AS mean gradient 10 mmHg.   She had a fall in 11/22 at home, seems to have been mechanical.  She developed a scalp hematoma and bruising on her arm.  Xarelto was held.  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  Xarelto was restarted.  She was sent to rehab for 3 wks and was not given her torsemide.    Patient was admitted twice at the hospital [facility] in 4/23, once  with diastolic CHF and once with ?TIA.  Workup was negative for recurrent CVA.  Echo in 4/23 showed EF 55-60%, mild LVH, mild RV dilation with decreased systolic function, mild AS with mean gradient 15 mmHg, PASP 33 mmHg.  She stopped Jardiance, does not feel like she could tolerate it.   Echo in 8/23 showed EF 55-60%, mild LVH, normal RV, PASP 42 mmHg, dilated IVC.   Today she returns for HF followup. Weight is stable.  At last appointment, she was doing well with NYHA class II symptoms, but today she is doing significantly worse.  She has been short of breath for about 2  wks.  She dyspneic walking around the house and with dressing, showering, and other ADLs.  No chest pain.  She does have orthopnea. Occasional lightheadedness with prolonged standing. No cough, fever, or wheezing. Occasional palpitations.   ECG (personally reviewed): atrial fibrillation, LVH, LAFB  REDs clip 39%  Labs (9/16): K 4, creatinine 1.31, BNP 466 Labs (11/16): BNP 577 Labs (12/16); K 4, creatinine 1.3, normal rheumatoid factor and scleroderma serologies Labs (1/17): ANA positive but only 1:80 titer, dsDNA negative, SSA/B negative, CCP negative.  Labs (2/17): K 4.1, creatinine 1.48 => 1.42, BNP 487 Labs (3/17): K 4.2, creatinine 1.58, BNP 326 Labs (7/17): K 3.3, creatinine 1.7, hgb 10.7 Labs (8/17): K 3.9, creatinine 1.87, HCT 35.6 Labs (1/18): hgb 9.9, creatinine 1.42 Labs (4/18): TSH normal, hgb 10.5, K 3.8, creatinine 1.34 Labs (6/18): BNP 415, K 4.9, creatinine 1.45 Labs (1/19): K 4.4, creatinine 1.56, hgb 11.3 Labs (7/19): K 4, creatinine 1.5, BNP 381 Labs (1/20): K 3.8, creatinine 1.38 Labs (11/20): K 3.8, creatinine 1.59 Labs (5/21): K 3.8, creatinine 1.4, myeloma panel negative, urine immunofixation negative Labs (8/21): BNP 319, hgb 10.2, K 4.4, creatinine 1.7 Labs (5/22): LDL 87, TSH normal, hgb 12.6, K 3.9, creatinine 1.26 Labs (11/22): LDL 72 Labs (12/22): K 3.7, creatinine 1.59 Labs (1/23): K 3.7, creatinine 1.30 Labs (3/23): K 3.6, creatinine 1.33 Labs (4/23): K 4, creatinine 1.31, LDL 76, HDL 45 Labs (6/23): K 3.3, creatinine 1.46, BNP 390 Labs (7/23): K 4.2, creaitnine 1.7 Labs (9/23): K 3.9, creatinine 1.4 Labs (11/23): BNP 350 Labs (12/23): K 3.7, creatinine 1.44   PMH: 1. Cardiolite (9/16) with EF 54%, no ischemia/infarction. 2. Chronic diastolic CHF: Echo (11/16) with EF 55-60%, normal RV size and systolic function, PA systolic pressure 66 mmHg.  - Echo (7/17) with EF 60-65%, mildly dilated RV with mildly decreased systolic function, PA systolic  pressure 36 mmHg.  - Echo (7/18) with EF 55-60%, mild to moderate AI, normal RV size and systolic function, PASP 43 mmHg.  - Echo (1/20) with EF 55-60%, normal RV size and systolic function, mild AI, mild MR.  - Echo (5/21) witih EF 60-65%, mild LVH, PASP 33 mmHg, normal RV, mild MR, mild-moderate AS with mean gradient 14 mmHg, AVA 1.23 cm^2.  - PYP scan (5/21): grade 0, H/CL 1.1 (not suggestive of TTR amyloidosis).  - Echo (5/22): EF 55-60%, mildly decreased RV systolic function, PASP 47, mild MR, mild AS mean gradient 10 mmHg. - Echo (4/23): EF 55-60%, mild LVH, mild RV dilation with decreased systolic function, mild AS with mean gradient 15 mmHg, PASP 33 mmHg. - Echo (8/23): EF 55-60%, mild LVH, normal RV, PASP 42 mmHg, dilated IVC 3. Chronic atrial fibrillation: She has been intolerant of Multaq and propafenone.  Sotalol and flecainide have not been used due to CKD.   4. SBO: Managed conservatively. 5.  CKD stage 3 6. OA: h/o THR and TKR.  7. H/o hyponatremia 8. Pneumonia with parapneumonic effusion with thoracenteses and eventual VATS and decortication.  9. Pulmonary hypertension: Suspect Group 1 pulmonary hypertension.  RHC (12/16) with mean RA 5, PA 62/22 mean 40, mean PCWP 19, CI 2.46, PVR 5 WU.  PFTs (12/16) wiith FVC 71%, FEV1 73%, ratio 104%, TLC 71%, DLCO 53% => moderate restriction concerning for interstitial lung disease.  High resolution CT (12/16) did not show evidence for interstitial lung disease.  V/Q scan (12/16) did not show evidence for chronic PE.  Serologies: RF negative, scleroderma antibodies negative, ANA positive but only 1:80 titer, SSA/B negative, dsDNA negative, CCP negative.  She did not tolerate macitentan or ambrisentan.  Echo 7/17 with PA systolic pressure down to 36 mmHg.   - 6 minute walk (12/16) with 244 meters - 6 minute walk (2/17) with 232 meters - 6 minute walk (3/17) with 414 meters - 6 minute walk (4/17) with 267 meters - 6 minute walk (8/17) with 241  meters - 6 minute walk (10/17) with 256 meters - 6 minute walk (6/18) with 305 meters - 6 minute walk (9/18) with 320 meters - 6 minute walk (1/19) with 213 meters - 6 minute walk (7/19) with 335 meters - 6 minute walk (1/20) with 213 meters - 6 minute walk (10/20) with 243 meters - 6 minute walk (2/21) with 243 meters - 6 minute walk (8/21) with 214 meters - 6 minute walk (2/22) with 260 meters - 6 minute walk (7/22) with 274 meters 10. Lightheadedness/syncope: Extensive workup.  Event and holter monitors as well as telemetry monitoring negative.  She has not been orthostatic.  - Did not tolerate pyridostigmine due to hot flashes.  11. Sciatica: Status post back surgery 1/18.  12. COVID-19 infection 12/20.  13. Peripheral neuropathy 14. Aortic stenosis: Mild to moderate on 5/21 echo.  - Mild on 4/23 echo.  15. Breast cancer: Triple negative on left, s/p mastectomy in 7/21.  16. CVA: 11/22, occurred while off anticoagulation.  - Carotid dopplers (11/22) showed 1-39% BICA stenosis.    Current Outpatient Medications  Medication Sig Dispense Refill   amoxicillin (AMOXIL) 500 MG capsule As needed before dental procedures     Ferrous Sulfate (IRON) 325 (65 Fe) MG TABS Take 1 tablet by mouth daily.     pantoprazole (PROTONIX) 40 MG tablet Take 40 mg by mouth daily.     potassium chloride (KLOR-CON) 10 MEQ tablet Take 4 tablets (40 mEq total) by mouth every morning AND 2 tablets (20 mEq total) every evening. 540 tablet 3   PREDNISOLONE ACETATE OP Apply 1 drop to eye in the morning, at noon, in the evening, and at bedtime. Left eye     REVATIO 20 MG tablet TAKE 4 TABLETS THREE TIMES A DAY 1080 tablet 3   torsemide (DEMADEX) 20 MG tablet Take 4 tablets (80 mg total) by mouth daily. 90 tablet 5   UPTRAVI 1000 MCG TABS Take 1 tablet by mouth 2 (two) times daily. 180 tablet 3   XARELTO 15 MG TABS tablet TAKE 1 TABLET DAILY WITH SUPPER 90 tablet 3   No current facility-administered medications  for this visit.   Allergies:   Acetaminophen, Hydrocodone-acetaminophen, Letairis [ambrisentan], Opsumit [macitentan], Ace inhibitors, Atenolol, Hctz [hydrochlorothiazide], Oxycodone, and Diltiazem hcl   Social History:  The patient  reports that she has never smoked. She has never used smokeless tobacco. She reports that she does not drink alcohol and  does not use drugs.   Family History:  The patient's family history includes Cancer in her mother.   ROS:  Please see the history of present illness.   All other systems are personally reviewed and negative.   Recent Labs: 11/12/2021: Hemoglobin 9.4; Platelets 226 01/19/2022: B Natriuretic Peptide 435.7 01/29/2022: BUN 23; Creatinine, Ser 1.62; Potassium 4.3; Sodium 135  Personally reviewed   Wt Readings from Last 3 Encounters:  01/19/22 60.1 kg (132 lb 6.4 oz)  11/12/21 59.9 kg (132 lb)  08/07/21 57.2 kg (126 lb 3.2 oz)   Physical Exam:   There were no vitals taken for this visit. General: NAD Neck: JVP 8-9 cm with HJR, no thyromegaly or thyroid nodule.  Lungs: Clear to auscultation bilaterally with normal respiratory effort. CV: Nondisplaced PMI.  Heart regular S1/S2, no S3/S4, 2/6 early SEM RUSB.  No peripheral edema.  No carotid bruit.  Normal pedal pulses.  Abdomen: Soft, nontender, no hepatosplenomegaly, no distention.  Skin: Intact without lesions or rashes.  Neurologic: Alert and oriented x 3.  Psych: Normal affect. Extremities: No clubbing or cyanosis.  HEENT: Normal.   Assessment & Plan: 1. Chronic diastolic CHF:  Echo in 2/50 showed EF 55-60%, mild LVH, normal RV, PASP 42 mmHg, dilated IVC.  She is volume overloaded on exam today with NYHA class III symptoms.  REDS clip elevated at 39%. - Increase torsemide to 80 qam/40 qpm x 3 days then 80 mg daily after that.  BMET/BNP today, BMET 10 days.   - She did not tolerate Jardiance.   - Continue compression stockings.  2. Pulmonary hypertension: Moderate to severe pulmonary  hypertension by echo (PASP 66 mmHg). RHC showed moderate pulmonary arterial hypertension with PVR 5 WU.  PFTs with restrictive changes suggestive of interstitial process but high resolution CT did not show interstitial lung disease.  No evidence for chronic PE on V/Q scan.  Rheumatologic serologies all negative.  Possible group 1 pulmonary hypertension.  She was unable to tolerate macitentan or ambrisentan.  Insurance would not approve Engineer, site.  She is now on sildenafil 80 mg tid and selexipag 1000 mcg bid, unable to increase selexipag further due to symptoms.  Echo in 8/23 showed normal RV and mildly elevated PA pressure.  - Continue Selexipag at 1000 mcg bid, unable to tolerate uptitration of Selexipag any further.  - Continue sildenafil 80 mg tid.   3. Pulmonary: s/p PNA with parapneumonic effusion and eventual VATS with decortication.  As above, restrictive PFTs but no ILD on high resolution CT.  CT in 4/20 showed a concerning RLL nodule and emphysema, though she never smoked. Her pulmonologist ordered a PET scan after this per her report, and PET was normal.  4. Atrial fibrillation: Chronic. She is off Toprol XL due to lightheadedness and HR is under reasonable control.  - Continue Xarelto 15 mg daily. She should stop ASA with Xarelto use.  5. HTN: BP elevated in the office today.  Home readings are normal to mildly elevated.  - Continue to wear compression stockings.  - Due to fall risk, I will not add anti-hypertensives today.  6. CKD: Stage 3.  BMET today.  7. Aortic stenosis: Minimal on 8/23 echo.  8. CVA: She had a stroke in 11/22 while off Xarelto.  Carotid dopplers were unremarkable.  Her stroke was atrial fibrillation-related. She needs to stay on Xarelto if at all possible.  - Currently off atorvastatin, needs to restart.   Followup in 3 wks with APP.  Signed, Rafael Bihari, FNP  02/09/2022  [facility] [address] [facility] [place] [address] [phone] (office) [phone] (fax)

## 2022-02-12 ENCOUNTER — Encounter (HOSPITAL_COMMUNITY): Payer: Medicare Other

## 2022-03-03 ENCOUNTER — Encounter (HOSPITAL_COMMUNITY): Payer: Medicare Other

## 2022-04-28 NOTE — Progress Notes (Signed)
[facility] Note   Date:  04/29/2022   ID:  Rachael Myers, DOB 05-15-1929, MRN [id number]  Provider location: [facility] Type of Visit: Established patient  PCP:  Dan Maker., MD  HF Cardiologist:  Dr. Shirlee Latch   HPI: Rachael Myers is a 89 y.o. female who has a history of chronic diastolic CHF, chronic atrial fibrillation (failed multaq and propafenone), and prominent exertional dyspnea.  In 1/16, she developed PNA.  She had a parapneumonic effusion that required 4 thoracenteses.  Eventually, she had VATS with decortication and a wedge resection.  Cytology was negative from the pleural fluid.  Echo 11/16: normal LV EF and apparently normal RV EF.  PA systolic pressure estimation was moderate to severely increased.  RHC 12/16: moderate pulmonary arterial hypertension.  PFTs showed a restrictive pattern concerning for interstitial lung disease.  V/Q scan in 12/16 was not suggestive of chronic PE.  Despite abnormal PFTs, high resolution CT did not show interstitial lung disease.     At a prior appointment, started her on Opsumit.  She developed a cough and mouth soreness with this medication and had to stop it after about 7 days.  Symptoms resolved when she stopped it.  Tried to get Adcirca for her.  This was denied by her insurance company, so she was started on Revatio 20 mg tid instead.  She thinks that this has helped her breathing "a little."  She saw a pulmonary specialist who thought dyspnea was likely due to [facility].  Next, tried her on ambrisentan, but she developed facial swelling on ambrisentan (significant around the eyes).  She also developed worsening dyspnea.  She stopped ambrisentan and the swelling resolved but she was noted to be volume overloaded.  Switched her Lasix to torsemide.  She was started on selexipag which she has tolerated.    She had a difficult winter with episode of PNA in 2/20.  CT chest/abdomen/pelvis in 4/20 showed RLL  nodule concerning for malignancy and pancreatic cysts, likely indolent cystic pancreatic neoplasm. There was moderate emphysema though she never smoked.  She says that her pulmonologist in [facility] followed up on her CT with a PET scan that was normal.   Echo in 5/21 showed EF 60-65%, mild LVH, PASP 33 mmHg, normal RV, mild MR, mild-moderate AS with mean gradient 14 mmHg, AVA 1.23 cm^2.  PYP scan in 5/21 was not suggestive of TTR amyloidosis.   [name] and had mastectomy (no chemo or radiation).   Echo in 5/22 showed EF 55-60%, mildly decreased RV systolic function, PASP 47, mild MR, mild AS mean gradient 10 mmHg.   She had a fall in 11/22 at home, seems to have been mechanical.  She developed a scalp hematoma and bruising on her arm.  Xarelto was held.  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  Xarelto was restarted.  She was sent to rehab for 3 wks and was not given her torsemide.    Patient was admitted twice at the hospital in [facility] in 4/23, once with diastolic CHF and once with ?TIA.  Workup was negative for recurrent CVA.  Echo in 4/23 showed EF 55-60%, mild LVH, mild RV dilation with decreased systolic function, mild AS with mean gradient 15 mmHg, PASP 33 mmHg.  She stopped Jardiance, does not feel like she could tolerate it.   Echo in 8/23 showed EF 55-60%, mild LVH,  normal RV, PASP 42 mmHg, dilated IVC.   She was admitted to [facility] 02/21-02/26 with left femoral neck fracture after a fall and undewrent ORIF with cannulated screws. Course c/b AKI. She was given IV fluids and Torsemide stopped. She was discharged to rehab. Torsemide later restarted and eventually required addition of IV lasix. Underwent right thoracentesis as well. Once discharge from rehab she was readmitted to [facility] for additional diuresis. She was discharged on 80 mg furosemide daily and 50 mg spironolactone daily. Discharge weight 138 lb. Echo during admit with EF  65-70%, severe diastolic dysfunction, RV okay, mild to moderate MR, mild TR, RVSP at least 67 mmHg, IVC dilated with estimated RAP at least 15 mmHg  The patient was admitted to [facility] with SDH on 04/20/22 after a fall at home.  Had been falling at home as well as episodes of hypotension. Neurosurgery consulted and no surgical intervention recommended. Diuretics and pulmonary hypertension meds initially held. At discharge she was sent home on sildenafil 20 mg TID (hypotension on higher doses), lasix 20 mg BID and uptravi 400 mcg BID.  Metoprolol discontinued and Xarelto held until follow-up.  She is here today for f/u with her daughter and caregiver. Patient arrived in wheelchair.  She denies dyspnea, orthopnea, PND or lower extremity edema. Her weight is down 8 lb from last follow-up. She does not feel she is retaining fluid. Continues to have episodes of lightheadedness with position changes but no recurrent falls.   REDs clip 32%  Labs (9/16): K 4, creatinine 1.31, BNP 466 Labs (11/16): BNP 577 Labs (12/16); K 4, creatinine 1.3, normal rheumatoid factor and scleroderma serologies Labs (1/17): ANA positive but only 1:80 titer, dsDNA negative, SSA/B negative, CCP negative.  Labs (2/17): K 4.1, creatinine 1.48 => 1.42, BNP 487 Labs (3/17): K 4.2, creatinine 1.58, BNP 326 Labs (7/17): K 3.3, creatinine 1.7, hgb 10.7 Labs (8/17): K 3.9, creatinine 1.87, HCT 35.6 Labs (1/18): hgb 9.9, creatinine 1.42 Labs (4/18): TSH normal, hgb 10.5, K 3.8, creatinine 1.34 Labs (6/18): BNP 415, K 4.9, creatinine 1.45 Labs (1/19): K 4.4, creatinine 1.56, hgb 11.3 Labs (7/19): K 4, creatinine 1.5, BNP 381 Labs (1/20): K 3.8, creatinine 1.38 Labs (11/20): K 3.8, creatinine 1.59 Labs (5/21): K 3.8, creatinine 1.4, myeloma panel negative, urine immunofixation negative Labs (8/21): BNP 319, hgb 10.2, K 4.4, creatinine 1.7 Labs (5/22): LDL 87, TSH normal, hgb 12.6, K 3.9, creatinine 1.26 Labs (11/22): LDL 72 Labs  (12/22): K 3.7, creatinine 1.59 Labs (1/23): K 3.7, creatinine 1.30 Labs (3/23): K 3.6, creatinine 1.33 Labs (4/23): K 4, creatinine 1.31, LDL 76, HDL 45 Labs (6/23): K 3.3, creatinine 1.46, BNP 390 Labs (7/23): K 4.2, creaitnine 1.7 Labs (9/23): K 3.9, creatinine 1.4 Labs (11/23): BNP 350 Labs (12/23): K 3.7, creatinine 1.44   PMH: 1. Cardiolite (9/16) with EF 54%, no ischemia/infarction. 2. Chronic diastolic CHF: Echo (11/16) with EF 55-60%, normal RV size and systolic function, PA systolic pressure 66 mmHg.  - Echo (7/17) with EF 60-65%, mildly dilated RV with mildly decreased systolic function, PA systolic pressure 36 mmHg.  - Echo (7/18) with EF 55-60%, mild to moderate AI, normal RV size and systolic function, PASP 43 mmHg.  - Echo (1/20) with EF 55-60%, normal RV size and systolic function, mild AI, mild MR.  - Echo (5/21) witih EF 60-65%, mild LVH, PASP 33 mmHg, normal RV, mild MR, mild-moderate AS with mean gradient 14 mmHg, AVA 1.23 cm^2.  - PYP scan (5/21): grade 0,  H/CL 1.1 (not suggestive of TTR amyloidosis).  - Echo (5/22): EF 55-60%, mildly decreased RV systolic function, PASP 47, mild MR, mild AS mean gradient 10 mmHg. - Echo (4/23): EF 55-60%, mild LVH, mild RV dilation with decreased systolic function, mild AS with mean gradient 15 mmHg, PASP 33 mmHg. - Echo (8/23): EF 55-60%, mild LVH, normal RV, PASP 42 mmHg, dilated IVC - Echo (03/24): EF 65-70%, severe diastolic dysfunction, RV okay, mild to moderate MR, mild TR, RVSP at least 67 mmHg, IVC dilated with estimated RAP at least 15 mmHg 3. Chronic atrial fibrillation: She has been intolerant of Multaq and propafenone.  Sotalol and flecainide have not been used due to CKD.   4. SBO: Managed conservatively. 5. CKD stage 3 6. OA: h/o THR and TKR.  7. H/o hyponatremia 8. Pneumonia with parapneumonic effusion with thoracenteses and eventual VATS and decortication.  9. Pulmonary hypertension: Suspect Group 1 pulmonary  hypertension.  RHC (12/16) with mean RA 5, PA 62/22 mean 40, mean PCWP 19, CI 2.46, PVR 5 WU.  PFTs (12/16) wiith FVC 71%, FEV1 73%, ratio 104%, TLC 71%, DLCO 53% => moderate restriction concerning for interstitial lung disease.  High resolution CT (12/16) did not show evidence for interstitial lung disease.  V/Q scan (12/16) did not show evidence for chronic PE.  Serologies: RF negative, scleroderma antibodies negative, ANA positive but only 1:80 titer, SSA/B negative, dsDNA negative, CCP negative.  She did not tolerate macitentan or ambrisentan.  Echo 7/17 with PA systolic pressure down to 36 mmHg.   - 6 minute walk (12/16) with 244 meters - 6 minute walk (2/17) with 232 meters - 6 minute walk (3/17) with 414 meters - 6 minute walk (4/17) with 267 meters - 6 minute walk (8/17) with 241 meters - 6 minute walk (10/17) with 256 meters - 6 minute walk (6/18) with 305 meters - 6 minute walk (9/18) with 320 meters - 6 minute walk (1/19) with 213 meters - 6 minute walk (7/19) with 335 meters - 6 minute walk (1/20) with 213 meters - 6 minute walk (10/20) with 243 meters - 6 minute walk (2/21) with 243 meters - 6 minute walk (8/21) with 214 meters - 6 minute walk (2/22) with 260 meters - 6 minute walk (7/22) with 274 meters 10. Lightheadedness/syncope: Extensive workup.  Event and holter monitors as well as telemetry monitoring negative.  She has not been orthostatic.  - Did not tolerate pyridostigmine due to hot flashes.  11. Sciatica: Status post back surgery 1/18.  12. COVID-19 infection 12/20.  13. Peripheral neuropathy 14. Aortic stenosis: Mild to moderate on 5/21 echo.  - Mild on 4/23 echo.  15. Breast cancer: Triple negative on left, s/p mastectomy in 7/21.  16. CVA: 11/22, occurred while off anticoagulation.  - Carotid dopplers (11/22) showed 1-39% BICA stenosis.    Current Outpatient Medications  Medication Sig Dispense Refill   amoxicillin (AMOXIL) 500 MG capsule As needed before  dental procedures     furosemide (LASIX) 20 MG tablet Take 20 mg by mouth 2 (two) times daily.     levETIRAcetam (KEPPRA) 500 MG tablet Take 500 mg by mouth 2 (two) times daily. For 9 doses     Lidocaine-Menthol 4-1 % PTCH Apply topically every 12 (twelve) hours.     magnesium oxide (MAG-OX) 400 MG tablet Take 400 mg by mouth 2 (two) times daily.     pantoprazole (PROTONIX) 40 MG tablet Take 40 mg by mouth daily.  REVATIO 20 MG tablet TAKE 4 TABLETS THREE TIMES A DAY (Patient taking differently: 20 mg 3 (three) times daily.) 1080 tablet 3   spironolactone (ALDACTONE) 50 MG tablet Take 50 mg by mouth every morning.     UPTRAVI 1000 MCG TABS Take 1 tablet by mouth 2 (two) times daily. 180 tablet 3   No current facility-administered medications for this encounter.   Allergies:   Acetaminophen, Hydrocodone-acetaminophen, Letairis [ambrisentan], Opsumit [macitentan], Ace inhibitors, Atenolol, Hctz [hydrochlorothiazide], Oxycodone, and Diltiazem hcl   Social History:  The patient  reports that she has never smoked. She has never used smokeless tobacco. She reports that she does not drink alcohol and does not use drugs.   Family History:  The patient's family history includes Cancer in her mother.   ROS:  Please see the history of present illness.   All other systems are personally reviewed and negative.   Recent Labs: 11/12/2021: Hemoglobin 9.4; Platelets 226 01/19/2022: B Natriuretic Peptide 435.7 01/29/2022: BUN 23; Creatinine, Ser 1.62; Potassium 4.3; Sodium 135  Personally reviewed   Wt Readings from Last 3 Encounters:  04/29/22 56.6 kg (124 lb 12.8 oz)  01/19/22 60.1 kg (132 lb 6.4 oz)  11/12/21 59.9 kg (132 lb)   Physical Exam:   BP 134/70   Pulse 84   Wt 56.6 kg (124 lb 12.8 oz)   SpO2 95%   BMI 19.55 kg/m  General:  Well appearing elderly female, arrived in wheelchair. HEENT: + ecchymoses in various stages of healing surrounding left eye Neck: supple. no JVD. Carotids 2+  bilat; no bruits.  Cor: PMI nondisplaced. Irregular rhythm. No rubs, gallops, 2/6 SEM RUSB Lungs: clear Abdomen: soft, nontender, nondistended.  Extremities: no cyanosis, clubbing, rash, edema Neuro: alert & orientedx3, cranial nerves grossly intact. moves all 4 extremities w/o difficulty. Affect pleasant   Assessment & Plan: 1. Chronic diastolic CHF:  Echo in 8/23 showed EF 55-60%, mild LVH, normal RV, PASP 42 mmHg, dilated IVC.  Echo at [facility] 03/24  EF 65-70%, severe diastolic dysfunction, RV okay, mild to moderate MR, mild TR, RVSP at least 67 mmHg, IVC dilated with estimated RAP at least 15 mmHg. -NYHA II currently (however not very active following recent hospitalizations -Volume stable on exam and by ReDS (32%) -Multiple diuretic adjustments during recent admissions. Continue lasix 20 mg BID -Stop spiro 50 mg daily d/t orthostatic dizziness.  -She did not tolerate Jardiance.   -Continue compression stockings.  -Discussed with Dr. Shirlee Latch. She may benefit from addition of pyridostigmine but did not tolerate in the past d/t hot flashes. Could consider midodrine if symptoms do not improve. 2. Pulmonary hypertension: Moderate to severe pulmonary hypertension by echo (PASP 66 mmHg). RHC showed moderate pulmonary arterial hypertension with PVR 5 WU.  PFTs with restrictive changes suggestive of interstitial process but high resolution CT did not show interstitial lung disease.  No evidence for chronic PE on V/Q scan.  Rheumatologic serologies all negative.  Possible group 1 pulmonary hypertension.  She was unable to tolerate macitentan or ambrisentan.  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  She is now on sildenafil 80 mg tid and selexipag 1000 mcg bid, unable to increase selexipag further due to symptoms.  Echo in 8/23 showed normal RV and mildly elevated PA pressure. Echo during admit at [facility] 03/24  EF 65-70%, severe diastolic dysfunction, RV okay, mild to moderate MR, mild TR, RVSP at least 67 mmHg,  IVC dilated with estimated RAP at least 15 mmHg. - Had been on uptravi  1000 mcg BID and sildenafil 80 mg TID, uptravi reduced to 1000 mcg BID and sildenafil to 20 mg TID during recent admission to [facility] earlier this month. Continue current dose of sildenafil. Increase uptravi back to 1000 BID. We discussed that Cordie Grice should be taken twice a day to reduce risk of rebound effects. 3. Pulmonary: s/p PNA with parapneumonic effusion and eventual VATS with decortication.  As above, restrictive PFTs but no ILD on high resolution CT.  CT in 4/20 showed a concerning RLL nodule and emphysema, though she never smoked. Her pulmonologist ordered a PET scan after this per her report, and PET was normal.  4. Atrial fibrillation: Chronic. Off Toprol XL d/t hypotension - Xarelto stopped during recent admit with subdural hematoma. She wishes to remain off anticoagulation d/t risk of bleeding and recurrent falls. This is a difficult situation as she has had ischemic stroke off anticoagulation in the past. Would not be a good candidate for Watchman Device.  5. HTN now with episodes of orthostatic hypotension:  - Stopped spiro as above - Consider midodrine next if continues with symptoms. 6. CKD: Stage 3b/nearing stage IV.  Scr baseline had been 1.4-1.7. Multiple recent admits and adjustments in diuretics. Scr recently above 2, 2.25 on 04/23 - Continue to monitor 7. Aortic stenosis: Mild on echo 03/24 with mean gradient of 16 mmHg. EF 65-70% 8. CVA: She had a stroke in 11/22 while off Xarelto.  Carotid dopplers were unremarkable.  Her stroke was atrial fibrillation-related.  -Recent subdural hematoma and hip fracture after separate mechanical falls. Did not require neurosurgical intervention. Xarelto was discontinued. We discussed this today. She does not want to restart anticoagulation d/t risk of bleeding with history of recurrent falls.  - Currently off atorvastatin  Followup 3-4 weeks to assess volume status, may  need additional lasix off spiro  Signed, Maximino Sarin, PA-C  04/29/2022  [facility] [address] Heart and Vascular [place] [address] [phone] (office) [phone] (fax)

## 2022-04-29 ENCOUNTER — Ambulatory Visit (HOSPITAL_COMMUNITY)
Admission: RE | Admit: 2022-04-29 | Discharge: 2022-04-29 | Disposition: A | Discharge status: Home / Self Care | Payer: Medicare Other | Source: Ambulatory Visit | Attending: Physician Assistant | Admitting: Physician Assistant

## 2022-04-29 ENCOUNTER — Encounter (HOSPITAL_COMMUNITY): Payer: Self-pay

## 2022-04-29 VITALS — BP 134/70 | HR 84 | Wt 124.8 lb

## 2022-04-29 DIAGNOSIS — I951 Orthostatic hypotension: Secondary | ICD-10-CM | POA: Insufficient documentation

## 2022-04-29 DIAGNOSIS — I2721 Secondary pulmonary arterial hypertension: Secondary | ICD-10-CM | POA: Insufficient documentation

## 2022-04-29 DIAGNOSIS — N1832 Chronic kidney disease, stage 3b: Secondary | ICD-10-CM | POA: Insufficient documentation

## 2022-04-29 DIAGNOSIS — I10 Essential (primary) hypertension: Secondary | ICD-10-CM

## 2022-04-29 DIAGNOSIS — Z8616 Personal history of COVID-19: Secondary | ICD-10-CM | POA: Insufficient documentation

## 2022-04-29 DIAGNOSIS — Z853 Personal history of malignant neoplasm of breast: Secondary | ICD-10-CM | POA: Insufficient documentation

## 2022-04-29 DIAGNOSIS — I350 Nonrheumatic aortic (valve) stenosis: Secondary | ICD-10-CM | POA: Diagnosis not present

## 2022-04-29 DIAGNOSIS — Z79899 Other long term (current) drug therapy: Secondary | ICD-10-CM | POA: Insufficient documentation

## 2022-04-29 DIAGNOSIS — I272 Pulmonary hypertension, unspecified: Secondary | ICD-10-CM

## 2022-04-29 DIAGNOSIS — I482 Chronic atrial fibrillation, unspecified: Secondary | ICD-10-CM | POA: Diagnosis not present

## 2022-04-29 DIAGNOSIS — Z7901 Long term (current) use of anticoagulants: Secondary | ICD-10-CM | POA: Insufficient documentation

## 2022-04-29 DIAGNOSIS — I5032 Chronic diastolic (congestive) heart failure: Secondary | ICD-10-CM | POA: Diagnosis present

## 2022-04-29 DIAGNOSIS — I130 Hypertensive heart and chronic kidney disease with heart failure and stage 1 through stage 4 chronic kidney disease, or unspecified chronic kidney disease: Secondary | ICD-10-CM | POA: Diagnosis not present

## 2022-04-29 MED ORDER — UPTRAVI 1000 MCG PO TABS: 1000.0000 ug | ORAL_TABLET | Freq: Two times a day (BID) | ORAL | 3 refills | Status: DC

## 2022-04-29 NOTE — Patient Instructions (Addendum)
Thank you for coming in today  Labs were done today, if any labs are abnormal the clinic will call you No news is good news  Medications: Increase Uptravi to 1000 mcg 1 tablet twice daily STOP Spironolactone  Follow up appointments:  Your physician recommends that you schedule a follow-up appointment in:  3-4 weeks in clinic   Do the following things EVERYDAY: Weigh yourself in the morning before breakfast. Write it down and keep it in a log. Take your medicines as prescribed Eat low salt foods--Limit salt (sodium) to 2000 mg per day.  Stay as active as you can everyday Limit all fluids for the day to less than 2 liters   At the [facility], you and your health needs are our priority. As part of our continuing mission to provide you with exceptional heart care, we have created designated Provider Care Teams. These Care Teams include your primary Cardiologist (physician) and Advanced Practice Providers (APPs- Physician Assistants and Nurse Practitioners) who all work together to provide you with the care you need, when you need it.   You may see any of the following providers on your designated Care Team at your next follow up: Dr Arvilla Meres Dr [name] Dr. Marcos Eke, NP Robbie Lis, [place] [facility] [place], [place] Brynda Peon, NP Karle Plumber, PharmD   Please be sure to bring in all your medications bottles to every appointment.    Thank you for choosing  [facility]  If you have any questions or concerns before your next appointment please send [place] a message through [place] or call our office at [phone].    TO LEAVE A MESSAGE FOR THE NURSE SELECT OPTION 2, PLEASE LEAVE A MESSAGE INCLUDING: YOUR NAME DATE OF BIRTH CALL BACK NUMBER REASON FOR CALL**this is important as we prioritize the call backs  YOU WILL RECEIVE A CALL BACK THE SAME DAY AS LONG AS YOU CALL BEFORE 4:00  PM

## 2022-04-29 NOTE — Progress Notes (Signed)
ReDS Vest / Clip - 04/29/22 0900       ReDS Vest / Clip   Station Marker C    Ruler Value 24.5    ReDS Value Range Low volume    ReDS Actual Value 32

## 2022-05-03 ENCOUNTER — Telehealth (HOSPITAL_COMMUNITY): Payer: Self-pay

## 2022-05-03 NOTE — Telephone Encounter (Signed)
Patient is complaining of light headedness that comes and goes since increasing the [place] the last week. No abnormal shortness of breath.    Today 114/60 (no uptravi today)   Yesterday 91/56 (am) Saturday  104/57 (pm)

## 2022-05-03 NOTE — Telephone Encounter (Signed)
Symptoms usually get better with time.  Would recommend continue for now and see if they improve.  If not, can decrease from 1000 bid to 800 bid.

## 2022-05-04 ENCOUNTER — Other Ambulatory Visit (HOSPITAL_COMMUNITY): Payer: Self-pay

## 2022-05-04 MED ORDER — MIDODRINE HCL 2.5 MG PO TABS: 2.5000 mg | ORAL_TABLET | Freq: Three times a day (TID) | ORAL | 1 refills | Status: DC

## 2022-05-04 NOTE — Telephone Encounter (Signed)
They called back and stated her BP is dropping to 80/50 daily. She is not able to manage her BP. She is experiencing a lot of dizziness. They are keeping someone with her at all times because she is almost falling. Her PCP took her off Pantoprazole and Magnesium.  She is taking her Sildenafil 3x day, Furosemide 2x daily, and Uptravi twice daily.  They want to cut back on the Uptravi to 400mg  or 500mg . They are very concerned about her BP at this point and want to let you know the updated information.

## 2022-05-04 NOTE — Telephone Encounter (Signed)
I spoke with Rachael Myers and explained update and medication. She verbalized understanding of what to do and I sent Midodrine into her pharmacy (she did not want to have to worry with possible PA). She or family will call and update this week on how she is doing with addition. She confirmed she is not taking spironolactone.

## 2022-05-04 NOTE — Telephone Encounter (Signed)
Rachael Myers is not likely to be affecting her blood pressure.  She has had a long history of orthostatic hypotension, suspect this is a flare up of a long-standing issue likely triggered by a long, complicated hospital stay.  I would recommend that she start droxidopa 100 mg tid to decrease her orthostatic hypotension. If insurance will not cover this, use midodrine 2.5 mg tid.  Make sure she is not taking spironolactone any longer.

## 2022-05-06 ENCOUNTER — Telehealth (HOSPITAL_COMMUNITY): Payer: Self-pay

## 2022-05-06 NOTE — Telephone Encounter (Signed)
HH PT called and wanted to let us know her BP yesterday was 118/68 sitting, and dropped to 80/40 when standing. She was complaining of dizziness and lightheadedness as well. We recently started her on Midodrine.  Please advise if any changes.

## 2022-05-06 NOTE — Telephone Encounter (Signed)
She can increase midodrine to 5 mg tid.  

## 2022-05-07 NOTE — Telephone Encounter (Signed)
Spoke to patient and caregiver and updated on information.

## 2022-05-07 NOTE — Telephone Encounter (Signed)
Left message for her to call back

## 2022-05-08 ENCOUNTER — Telehealth: Payer: Self-pay | Admitting: Cardiology

## 2022-05-08 NOTE — Telephone Encounter (Addendum)
Patient's son called in reporting pt's BP has been low throughout the morning and afternoon. She was recently started on midodrine 2.5mg  TID which was increased to 5mg  TID. States the bp has been as low as 77/47 at one point today. She does feel dizzy with these lower BPs, but otherwise asymptomatic. Mentation is normal per family. Does have compression stockings but has not been using on regular basis. I advised to start regular use of stockings. She is not short of breath nor reports any LE edema. Advised to hold PO lasix 20mg  this evening and again tomorrow as she may be hypovolemic. For now, would continue medications as outlined per last AHF note related to pulmonary hypertension. Advised to follow up tomorrow regarding BP readings. Son voiced understanding and thanked me for callback.

## 2022-05-09 ENCOUNTER — Telehealth: Payer: Self-pay | Admitting: Cardiology

## 2022-05-09 NOTE — Telephone Encounter (Signed)
Spoke with patient's son again today to follow up on patient. Held diuretics, chart list lasix but he reports she has been on torsemide. Her BPs have been somewhat labile but improved since yesterday. Readings: 97/60 (first reading this morning), 178/80, 127/50 ( while sitting), 104/68 ( after standing to walk). She is not experiencing significant symptoms presently. Clinical picture does continue to seem consistent with orthostatic hypotension (hx of same). Has not been using compression stockings yet. I advised to continue to hold her diuretic for now and use compression stockings. They request visit in the office, I advised I would route to the AHF office for appt. Other medications continued without change.

## 2022-05-10 ENCOUNTER — Telehealth (HOSPITAL_COMMUNITY): Payer: Self-pay

## 2022-05-10 NOTE — Telephone Encounter (Signed)
Appt sch for 5/7 with Dr McLean 

## 2022-05-10 NOTE — Telephone Encounter (Signed)
Appt sch for 5/7 with Dr Shirlee Latch

## 2022-05-10 NOTE — Telephone Encounter (Signed)
[name] from [facility] called about Rachael Myers. They are requesting BP parameters because of her continuing low BP readings.  Please advise.

## 2022-05-10 NOTE — Telephone Encounter (Signed)
Left message for home health nurse for updated information

## 2022-05-10 NOTE — Telephone Encounter (Signed)
I'm not sure what they want BP parameters for, she is not on any BP meds now.  If she is not dizzy, would not check her BP.  It is going to be up and down, she has chronically had orthostatic hypotension. She needs to continue her diuretics or she will end up in hospital with CHF.  She needs an appointment in office.  Work her in with me one of the afternoons that I am in [place] [facility] clinic this week please.

## 2022-05-11 ENCOUNTER — Encounter (HOSPITAL_COMMUNITY): Payer: Self-pay | Admitting: Cardiology

## 2022-05-11 ENCOUNTER — Ambulatory Visit (HOSPITAL_COMMUNITY)
Admission: RE | Admit: 2022-05-11 | Discharge: 2022-05-11 | Disposition: A | Discharge status: Home / Self Care | Payer: Medicare Other | Source: Ambulatory Visit | Attending: Cardiology | Admitting: Cardiology

## 2022-05-11 VITALS — Wt 129.4 lb

## 2022-05-11 DIAGNOSIS — Z7901 Long term (current) use of anticoagulants: Secondary | ICD-10-CM | POA: Insufficient documentation

## 2022-05-11 DIAGNOSIS — I5032 Chronic diastolic (congestive) heart failure: Secondary | ICD-10-CM | POA: Insufficient documentation

## 2022-05-11 DIAGNOSIS — Z8616 Personal history of COVID-19: Secondary | ICD-10-CM | POA: Diagnosis not present

## 2022-05-11 DIAGNOSIS — I444 Left anterior fascicular block: Secondary | ICD-10-CM | POA: Diagnosis not present

## 2022-05-11 DIAGNOSIS — I272 Pulmonary hypertension, unspecified: Secondary | ICD-10-CM | POA: Diagnosis not present

## 2022-05-11 DIAGNOSIS — I482 Chronic atrial fibrillation, unspecified: Secondary | ICD-10-CM | POA: Insufficient documentation

## 2022-05-11 DIAGNOSIS — Z8673 Personal history of transient ischemic attack (TIA), and cerebral infarction without residual deficits: Secondary | ICD-10-CM | POA: Insufficient documentation

## 2022-05-11 DIAGNOSIS — I2721 Secondary pulmonary arterial hypertension: Secondary | ICD-10-CM | POA: Insufficient documentation

## 2022-05-11 DIAGNOSIS — Z79899 Other long term (current) drug therapy: Secondary | ICD-10-CM | POA: Insufficient documentation

## 2022-05-11 DIAGNOSIS — I951 Orthostatic hypotension: Secondary | ICD-10-CM | POA: Diagnosis not present

## 2022-05-11 DIAGNOSIS — I350 Nonrheumatic aortic (valve) stenosis: Secondary | ICD-10-CM | POA: Diagnosis not present

## 2022-05-11 DIAGNOSIS — Z853 Personal history of malignant neoplasm of breast: Secondary | ICD-10-CM | POA: Insufficient documentation

## 2022-05-11 DIAGNOSIS — N184 Chronic kidney disease, stage 4 (severe): Secondary | ICD-10-CM | POA: Diagnosis not present

## 2022-05-11 LAB — BRAIN NATRIURETIC PEPTIDE: B Natriuretic Peptide: 383.3 pg/mL — ABNORMAL HIGH (ref 0.0–100.0)

## 2022-05-11 LAB — BASIC METABOLIC PANEL
Anion gap: 13 (ref 5–15)
BUN: 33 mg/dL — ABNORMAL HIGH (ref 8–23)
CO2: 25 mmol/L (ref 22–32)
Calcium: 9.4 mg/dL (ref 8.9–10.3)
Chloride: 94 mmol/L — ABNORMAL LOW (ref 98–111)
Creatinine, Ser: 1.41 mg/dL — ABNORMAL HIGH (ref 0.44–1.00)
GFR, Estimated: 35 mL/min — ABNORMAL LOW (ref >60)
Glucose, Bld: 88 mg/dL (ref 70–99)
Potassium: 3.1 mmol/L — ABNORMAL LOW (ref 3.5–5.1)
Sodium: 132 mmol/L — ABNORMAL LOW (ref 135–145)

## 2022-05-11 MED ORDER — MIDODRINE HCL 10 MG PO TABS: 10.0000 mg | ORAL_TABLET | Freq: Three times a day (TID) | ORAL | 3 refills | Status: DC

## 2022-05-11 MED ORDER — SILDENAFIL CITRATE 20 MG PO TABS: 20.0000 mg | ORAL_TABLET | Freq: Three times a day (TID) | ORAL | 3 refills | Status: DC

## 2022-05-11 NOTE — Patient Instructions (Signed)
INCREASE  Midodrine 10 mg Three times a day  RESTART Lasix 20 mg Twice daily  Labs done today, your results will be available in MyChart, we will contact you for abnormal readings.  Wear Abdominal Binder.  Your physician recommends that you schedule a follow-up appointment in: 3 weeks  If you have any questions or concerns before your next appointment please send [place] a message through [place] or call our office at [phone].    TO LEAVE A MESSAGE FOR THE NURSE SELECT OPTION 2, PLEASE LEAVE A MESSAGE INCLUDING: YOUR NAME DATE OF BIRTH CALL BACK NUMBER REASON FOR CALL**this is important as we prioritize the call backs  YOU WILL RECEIVE A CALL BACK THE SAME DAY AS LONG AS YOU CALL BEFORE 4:00 PM  At the [facility], you and your health needs are our priority. As part of our continuing mission to provide you with exceptional heart care, we have created designated Provider Care Teams. These Care Teams include your primary Cardiologist (physician) and Advanced Practice Providers (APPs- Physician Assistants and Nurse Practitioners) who all work together to provide you with the care you need, when you need it.   You may see any of the following providers on your designated Care Team at your next follow up: Dr Arvilla Meres Dr [name] Dr. Marcos Eke, NP Robbie Lis, [place] [facility] [place], [place] Brynda Peon, NP Karle Plumber, PharmD   Please be sure to bring in all your medications bottles to every appointment.    Thank you for choosing [facility]

## 2022-05-12 NOTE — Progress Notes (Signed)
[facility] Note   Date:  05/12/2022   ID:  Rachael Myers, DOB 05/31/1929, MRN [id number]  Provider location: [facility] Type of Visit: Established patient  PCP:  Dan Maker., MD  HF Cardiologist:  Dr. Shirlee Latch   HPI: Rachael Myers is a 89 y.o. female who has a history of chronic diastolic CHF, chronic atrial fibrillation (failed multaq and propafenone), and prominent exertional dyspnea.  In 1/16, she developed PNA.  She had a parapneumonic effusion that required 4 thoracenteses.  Eventually, she had VATS with decortication and a wedge resection.  Cytology was negative from the pleural fluid.  Echo 11/16: normal LV EF and apparently normal RV EF.  PA systolic pressure estimation was moderate to severely increased.  RHC 12/16: moderate pulmonary arterial hypertension.  PFTs showed a restrictive pattern concerning for interstitial lung disease.  V/Q scan in 12/16 was not suggestive of chronic PE.  Despite abnormal PFTs, high resolution CT did not show interstitial lung disease.     At a prior appointment, started her on Opsumit.  She developed a cough and mouth soreness with this medication and had to stop it after about 7 days.  Symptoms resolved when she stopped it.  Tried to get Adcirca for her.  This was denied by her insurance company, so she was started on Revatio 20 mg tid instead.  She thinks that this has helped her breathing "a little."  She saw a pulmonary specialist who thought dyspnea was likely due to [facility].  Next, tried her on ambrisentan, but she developed facial swelling on ambrisentan (significant around the eyes).  She also developed worsening dyspnea.  She stopped ambrisentan and the swelling resolved but she was noted to be volume overloaded.  Switched her Lasix to torsemide.  She was started on selexipag which she has tolerated.    She had a difficult winter with episode of PNA in 2/20.  CT chest/abdomen/pelvis in 4/20 showed RLL  nodule concerning for malignancy and pancreatic cysts, likely indolent cystic pancreatic neoplasm. There was moderate emphysema though she never smoked.  She says that her pulmonologist in [facility] followed up on her CT with a PET scan that was normal.   Echo in 5/21 showed EF 60-65%, mild LVH, PASP 33 mmHg, normal RV, mild MR, mild-moderate AS with mean gradient 14 mmHg, AVA 1.23 cm^2.  PYP scan in 5/21 was not suggestive of TTR amyloidosis.   [name] and had mastectomy (no chemo or radiation).   Echo in 5/22 showed EF 55-60%, mildly decreased RV systolic function, PASP 47, mild MR, mild AS mean gradient 10 mmHg.   She had a fall in 11/22 at home, seems to have been mechanical.  She developed a scalp hematoma and bruising on her arm.  Xarelto was held.  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  Xarelto was restarted.  She was sent to rehab for 3 wks and was not given her torsemide.    Patient was admitted twice at the hospital in [facility] in 4/23, once with diastolic CHF and once with ?TIA.  Workup was negative for recurrent CVA.  Echo in 4/23 showed EF 55-60%, mild LVH, mild RV dilation with decreased systolic function, mild AS with mean gradient 15 mmHg, PASP 33 mmHg.  She stopped Jardiance, does not feel like she could tolerate it.   Echo in 8/23 showed EF 55-60%, mild LVH,  normal RV, PASP 42 mmHg, dilated IVC.   She was admitted to [facility] 02/21-02/26 with left femoral neck fracture after a fall and undewrent ORIF with cannulated screws. Course c/b AKI. She was given IV fluids and Torsemide stopped. She was discharged to rehab. Torsemide later restarted and eventually required addition of IV lasix. Underwent right thoracentesis as well. Once discharge from rehab she was readmitted to [facility] for additional diuresis. She was discharged on 80 mg furosemide daily and 50 mg spironolactone daily. Discharge weight 138 lb. Echo during admit with EF  65-70%, severe diastolic dysfunction, RV okay, mild to moderate MR, mild TR, RVSP at least 67 mmHg, IVC dilated with estimated RAP at least 15 mmHg  The patient was admitted to [facility] with SDH on 04/20/22 after a fall at home.  Had been falling at home as well as episodes of hypotension. Neurosurgery consulted and no surgical intervention recommended. Diuretics and pulmonary hypertension meds initially held. At discharge she was sent home on sildenafil 20 mg TID (hypotension on higher doses), lasix 20 mg BID and uptravi 400 mcg BID.  Metoprolol discontinued and Xarelto held until follow-up.  She returns for followup of pulmonary hypertension, CHF, and orthostatic hypotension.  She continues to have significant orthostatic hypotension, she brings a notebook with many BP readings documented showing orthostatic drop in BP though she is actually not orthostatic in the office when measured today. I had her stop spironolactone recently then started midodrine and increased it last week to 5 mg tid. She is wearing compression stockings.  Still lightheaded with standing, but I think she is better on midodrine.  Weight is up 5 lbs. She has not been taking Lasix.  She is short of breath walking 50-100 feet.  No further falls.  No syncope.  No chest pain.   Labs (9/16): K 4, creatinine 1.31, BNP 466 Labs (11/16): BNP 577 Labs (12/16); K 4, creatinine 1.3, normal rheumatoid factor and scleroderma serologies Labs (1/17): ANA positive but only 1:80 titer, dsDNA negative, SSA/B negative, CCP negative.  Labs (2/17): K 4.1, creatinine 1.48 => 1.42, BNP 487 Labs (3/17): K 4.2, creatinine 1.58, BNP 326 Labs (7/17): K 3.3, creatinine 1.7, hgb 10.7 Labs (8/17): K 3.9, creatinine 1.87, HCT 35.6 Labs (1/18): hgb 9.9, creatinine 1.42 Labs (4/18): TSH normal, hgb 10.5, K 3.8, creatinine 1.34 Labs (6/18): BNP 415, K 4.9, creatinine 1.45 Labs (1/19): K 4.4, creatinine 1.56, hgb 11.3 Labs (7/19): K 4, creatinine 1.5, BNP  381 Labs (1/20): K 3.8, creatinine 1.38 Labs (11/20): K 3.8, creatinine 1.59 Labs (5/21): K 3.8, creatinine 1.4, myeloma panel negative, urine immunofixation negative Labs (8/21): BNP 319, hgb 10.2, K 4.4, creatinine 1.7 Labs (5/22): LDL 87, TSH normal, hgb 12.6, K 3.9, creatinine 1.26 Labs (11/22): LDL 72 Labs (12/22): K 3.7, creatinine 1.59 Labs (1/23): K 3.7, creatinine 1.30 Labs (3/23): K 3.6, creatinine 1.33 Labs (4/23): K 4, creatinine 1.31, LDL 76, HDL 45 Labs (6/23): K 3.3, creatinine 1.46, BNP 390 Labs (7/23): K 4.2, creaitnine 1.7 Labs (9/23): K 3.9, creatinine 1.4 Labs (11/23): BNP 350 Labs (12/23): K 3.7, creatinine 1.44 Labs (4/24): K 5.1, creatinine 1.92 => 2.25   PMH: 1. Cardiolite (9/16) with EF 54%, no ischemia/infarction. 2. Chronic diastolic CHF: Echo (11/16) with EF 55-60%, normal RV size and systolic function, PA systolic pressure 66 mmHg.  - Echo (7/17) with EF 60-65%, mildly dilated RV with mildly decreased systolic function, PA systolic pressure 36 mmHg.  - Echo (7/18) with EF 55-60%, mild  to moderate AI, normal RV size and systolic function, PASP 43 mmHg.  - Echo (1/20) with EF 55-60%, normal RV size and systolic function, mild AI, mild MR.  - Echo (5/21) witih EF 60-65%, mild LVH, PASP 33 mmHg, normal RV, mild MR, mild-moderate AS with mean gradient 14 mmHg, AVA 1.23 cm^2.  - PYP scan (5/21): grade 0, H/CL 1.1 (not suggestive of TTR amyloidosis).  - Echo (5/22): EF 55-60%, mildly decreased RV systolic function, PASP 47, mild MR, mild AS mean gradient 10 mmHg. - Echo (4/23): EF 55-60%, mild LVH, mild RV dilation with decreased systolic function, mild AS with mean gradient 15 mmHg, PASP 33 mmHg. - Echo (8/23): EF 55-60%, mild LVH, normal RV, PASP 42 mmHg, dilated IVC - Echo (03/24): EF 65-70%, severe diastolic dysfunction, RV okay, mild to moderate MR, mild TR, RVSP at least 67 mmHg, IVC dilated with estimated RAP at least 15 mmHg 3. Chronic atrial fibrillation:  She has been intolerant of Multaq and propafenone.  Sotalol and flecainide have not been used due to CKD.   4. SBO: Managed conservatively. 5. CKD stage 3 6. OA: h/o THR and TKR.  7. H/o hyponatremia 8. Pneumonia with parapneumonic effusion with thoracenteses and eventual VATS and decortication.  9. Pulmonary hypertension: Suspect Group 1 pulmonary hypertension.  RHC (12/16) with mean RA 5, PA 62/22 mean 40, mean PCWP 19, CI 2.46, PVR 5 WU.  PFTs (12/16) wiith FVC 71%, FEV1 73%, ratio 104%, TLC 71%, DLCO 53% => moderate restriction concerning for interstitial lung disease.  High resolution CT (12/16) did not show evidence for interstitial lung disease.  V/Q scan (12/16) did not show evidence for chronic PE.  Serologies: RF negative, scleroderma antibodies negative, ANA positive but only 1:80 titer, SSA/B negative, dsDNA negative, CCP negative.  She did not tolerate macitentan or ambrisentan.  Echo 7/17 with PA systolic pressure down to 36 mmHg.   - 6 minute walk (12/16) with 244 meters - 6 minute walk (2/17) with 232 meters - 6 minute walk (3/17) with 414 meters - 6 minute walk (4/17) with 267 meters - 6 minute walk (8/17) with 241 meters - 6 minute walk (10/17) with 256 meters - 6 minute walk (6/18) with 305 meters - 6 minute walk (9/18) with 320 meters - 6 minute walk (1/19) with 213 meters - 6 minute walk (7/19) with 335 meters - 6 minute walk (1/20) with 213 meters - 6 minute walk (10/20) with 243 meters - 6 minute walk (2/21) with 243 meters - 6 minute walk (8/21) with 214 meters - 6 minute walk (2/22) with 260 meters - 6 minute walk (7/22) with 274 meters 10. Lightheadedness/syncope: Extensive workup.  Event and holter monitors as well as telemetry monitoring negative.  She has not been orthostatic.  - Did not tolerate pyridostigmine due to hot flashes.  11. Sciatica: Status post back surgery 1/18.  12. COVID-19 infection 12/20.  13. Peripheral neuropathy 14. Aortic stenosis: Mild  to moderate on 5/21 echo.  - Mild on 4/23 echo.  15. Breast cancer: Triple negative on left, s/p mastectomy in 7/21.  16. CVA: 11/22, occurred while off anticoagulation.  - Carotid dopplers (11/22) showed 1-39% BICA stenosis.  17. Traumatic subdural hematoma: 4/24.  18. Autonomic neuropathy with orthostatic hypotension   Current Outpatient Medications  Medication Sig Dispense Refill   amoxicillin (AMOXIL) 500 MG capsule As needed before dental procedures     furosemide (LASIX) 20 MG tablet Take 20 mg by mouth 2 (two)  times daily.     Selexipag (UPTRAVI) 1000 MCG TABS Take 1,000 mcg by mouth in the morning and at bedtime. 90 tablet 3   midodrine (PROAMATINE) 10 MG tablet Take 1 tablet (10 mg total) by mouth 3 (three) times daily with meals. 180 tablet 3   sildenafil (REVATIO) 20 MG tablet Take 1 tablet (20 mg total) by mouth 3 (three) times daily. 1080 tablet 3   No current facility-administered medications for this encounter.   Allergies:   Acetaminophen, Hydrocodone-acetaminophen, Letairis [ambrisentan], Opsumit [macitentan], Ace inhibitors, Atenolol, Hctz [hydrochlorothiazide], Oxycodone, and Diltiazem hcl   Social History:  The patient  reports that she has never smoked. She has never used smokeless tobacco. She reports that she does not drink alcohol and does not use drugs.   Family History:  The patient's family history includes Cancer in her mother.   ROS:  Please see the history of present illness.   All other systems are personally reviewed and negative.   Recent Labs: 11/12/2021: Hemoglobin 9.4; Platelets 226 05/11/2022: B Natriuretic Peptide 383.3; BUN 33; Creatinine, Ser 1.41; Potassium 3.1; Sodium 132  Personally reviewed   Wt Readings from Last 3 Encounters:  05/11/22 58.7 kg (129 lb 6.4 oz)  04/29/22 56.6 kg (124 lb 12.8 oz)  01/19/22 60.1 kg (132 lb 6.4 oz)   Physical Exam:   Wt 58.7 kg (129 lb 6.4 oz)   SpO2 98%   BMI 20.27 kg/m  General: NAD Neck: JVP 8-9 cm,  no thyromegaly or thyroid nodule.  Lungs: Clear to auscultation bilaterally with normal respiratory effort. CV: Nondisplaced PMI.  Heart regular S1/S2, no S3/S4, 3/6 SEM RUSB with clear S2.  No peripheral edema.  No carotid bruit.  Normal pedal pulses.  Abdomen: Soft, nontender, no hepatosplenomegaly, no distention.  Skin: Intact without lesions or rashes.  Neurologic: Alert and oriented x 3.  Psych: Normal affect. Extremities: No clubbing or cyanosis.  HEENT: Normal.   Assessment & Plan: 1. Chronic diastolic CHF:  Echo in 8/23 showed EF 55-60%, mild LVH, normal RV, PASP 42 mmHg, dilated IVC.  Echo at [facility] in 3/24 wit  EF 65-70%, severe diastolic dysfunction, RV normal, mild to moderate MR, mild TR, RVSP at least 67 mmHg, IVC dilated with estimated RAP at least 15 mmHg.  NYHA class III though seems more limited by orthostasis than dyspnea at this point. Weight is up.  She has not been taking Lasix.  - She needs to restart Lasix 20 mg bid.  I do not think that she is going to be able to be off a diuretic altogether.  - She is now off spironolactone.   - She did not tolerate Jardiance.   2. Pulmonary hypertension: Moderate to severe pulmonary hypertension by echo (PASP 66 mmHg). RHC showed moderate pulmonary arterial hypertension with PVR 5 WU.  PFTs with restrictive changes suggestive of interstitial process but high resolution CT did not show interstitial lung disease.  No evidence for chronic PE on V/Q scan.  Rheumatologic serologies all negative.  Possible group 1 pulmonary hypertension.  She was unable to tolerate macitentan or ambrisentan.  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  She is now on sildenafil 20 mg tid and selexipag 1000 mcg bid, unable to increase selexipag further due to symptoms.  Echo in 8/23 showed normal RV and mildly elevated PA pressure. Echo during admit at [facility] 03/24 with EF 65-70%, severe diastolic dysfunction, RV okay, mild to moderate MR, mild TR, RVSP at least 67 mmHg, IVC  dilated with estimated RAP at least 15 mmHg. - Continue Uptravi 1000 mg bid.  - Continue sildenafil 20 tid.  This was decreased due to orthostasis.  3. Pulmonary: s/p PNA with parapneumonic effusion and eventual VATS with decortication.  As above, restrictive PFTs but no ILD on high resolution CT.  CT in 4/20 showed a concerning RLL nodule and emphysema, though she never smoked. Her pulmonologist ordered a PET scan after this per her report, and PET was normal.  4. Atrial fibrillation: Chronic. Off Toprol XL d/t hypotension. HR is controlled.  Xarelto stopped during 4/24 admit with subdural hematoma. She wishes to remain off anticoagulation due to risk of bleeding and recurrent falls. This is a difficult situation as she has had ischemic stroke off anticoagulation in the past. Would not be a good candidate for Watchman Device.  - She will remain off Xarelto for now with orthostatic hypotension and falls, SDH.  5. Orthostatic hypotension: She has a long history of this though it has become worse again recently.  - Continue compression stockings.  - I will have her wear an abdominal binder during the day.  - She did not tolerate pyridostigmine in the past due to hot flashes.  She says she would be willing to try it again if needed.  - Increase midodrine to 10 mg tid.  6. CKD: Stage 3b/nearing stage IV.  Scr baseline had been 1.4-1.7. Multiple recent admits and adjustments in diuretics. Scr recently above 2, 2.25 in 4/24.  - BMET today.  7. Aortic stenosis: Mild on echo 03/24 with mean gradient of 16 mmHg.  8. CVA: She had a stroke in 11/22 while off Xarelto.  Carotid dopplers were unremarkable.  Her stroke was atrial fibrillation-related. Recent subdural hematoma and hip fracture after separate mechanical falls. Did not require neurosurgical intervention. Xarelto was discontinued. We discussed this today. She does not want to restart anticoagulation d/t risk of bleeding with history of recurrent fallsand  SDH.   [name] 05/12/2022  [facility] [address] [facility] [place] [address] [phone] (office) [phone] (fax)

## 2022-05-13 ENCOUNTER — Other Ambulatory Visit (HOSPITAL_COMMUNITY): Payer: Self-pay | Admitting: Cardiology

## 2022-05-13 ENCOUNTER — Telehealth (HOSPITAL_COMMUNITY): Payer: Self-pay

## 2022-05-13 DIAGNOSIS — I5032 Chronic diastolic (congestive) heart failure: Secondary | ICD-10-CM

## 2022-05-13 MED ORDER — POTASSIUM CHLORIDE CRYS ER 20 MEQ PO TBCR: 40.0000 meq | EXTENDED_RELEASE_TABLET | Freq: Every day | ORAL | 3 refills | Status: DC

## 2022-05-13 NOTE — Telephone Encounter (Signed)
-----   Message from Laurey Morale, MD sent at 05/12/2022  8:19 AM EDT ----- Add KCl 40 daily.  BMET 1 week.

## 2022-05-13 NOTE — Telephone Encounter (Signed)
Patient and Caregiver, Diane advised and verbalized understanding,lab appointment scheduled,lab orders entered. Med list updated to reflect changes.   Meds ordered this encounter  Medications   potassium chloride SA (KLOR-CON M20) 20 MEQ tablet    Sig: Take 2 tablets (40 mEq total) by mouth daily.    Dispense:  180 tablet    Refill:  3   Orders Placed This Encounter  Procedures   Basic metabolic panel    Standing Status:   Future    Standing Expiration Date:   05/13/2023    Order Specific Question:   Release to patient    Answer:   Immediate    Order Specific Question:   Release to patient    Answer:   Immediate [1]

## 2022-05-14 ENCOUNTER — Other Ambulatory Visit (HOSPITAL_COMMUNITY): Payer: Self-pay

## 2022-05-14 MED ORDER — MIDODRINE HCL 10 MG PO TABS: 10.0000 mg | ORAL_TABLET | Freq: Three times a day (TID) | ORAL | 3 refills | Status: DC

## 2022-05-20 ENCOUNTER — Ambulatory Visit (HOSPITAL_COMMUNITY)
Admission: RE | Admit: 2022-05-20 | Discharge: 2022-05-20 | Disposition: A | Discharge status: Home / Self Care | Payer: Medicare Other | Source: Ambulatory Visit | Attending: Cardiology | Admitting: Cardiology

## 2022-05-20 DIAGNOSIS — I5032 Chronic diastolic (congestive) heart failure: Secondary | ICD-10-CM | POA: Diagnosis present

## 2022-05-20 LAB — BASIC METABOLIC PANEL
Anion gap: 12 (ref 5–15)
BUN: 31 mg/dL — ABNORMAL HIGH (ref 8–23)
CO2: 24 mmol/L (ref 22–32)
Calcium: 9.2 mg/dL (ref 8.9–10.3)
Chloride: 98 mmol/L (ref 98–111)
Creatinine, Ser: 1.63 mg/dL — ABNORMAL HIGH (ref 0.44–1.00)
GFR, Estimated: 30 mL/min — ABNORMAL LOW (ref >60)
Glucose, Bld: 139 mg/dL — ABNORMAL HIGH (ref 70–99)
Potassium: 3.1 mmol/L — ABNORMAL LOW (ref 3.5–5.1)
Sodium: 134 mmol/L — ABNORMAL LOW (ref 135–145)

## 2022-05-24 ENCOUNTER — Telehealth (HOSPITAL_COMMUNITY): Payer: Self-pay | Admitting: Cardiology

## 2022-05-24 MED ORDER — POTASSIUM CHLORIDE CRYS ER 20 MEQ PO TBCR: 40.0000 meq | EXTENDED_RELEASE_TABLET | Freq: Two times a day (BID) | ORAL | 3 refills | Status: DC

## 2022-05-24 NOTE — Telephone Encounter (Signed)
Patient called.  Patient aware.  

## 2022-05-24 NOTE — Telephone Encounter (Signed)
-----   Message from Laurey Morale, MD sent at 05/20/2022 10:46 PM EDT ----- Increase total daily KCl by 40 mEq and repeat BMET in 10 days.

## 2022-05-26 NOTE — Progress Notes (Signed)
[facility] Note  Date:  05/26/2022   ID:  Rachael Myers, DOB 1929/11/07, MRN [id number]  Provider location: [place] [facility] Type of Visit: Established patient  PCP:  Dan Maker., MD  HF Cardiologist:  Dr. Shirlee Latch   HPI: Rachael Myers is a 89 y.o. female who has a history of chronic diastolic CHF, chronic atrial fibrillation (failed multaq and propafenone), and prominent exertional dyspnea.  In 1/16, she developed PNA.  She had a parapneumonic effusion that required 4 thoracenteses.  Eventually, she had VATS with decortication and a wedge resection.  Cytology was negative from the pleural fluid.  Echo 11/16: normal LV EF and apparently normal RV EF.  PA systolic pressure estimation was moderate to severely increased.  RHC 12/16: moderate pulmonary arterial hypertension.  PFTs showed a restrictive pattern concerning for interstitial lung disease.  V/Q scan in 12/16 was not suggestive of chronic PE.  Despite abnormal PFTs, high resolution CT did not show interstitial lung disease.     At a prior appointment, started her on Opsumit.  She developed a cough and mouth soreness with this medication and had to stop it after about 7 days.  Symptoms resolved when she stopped it.  Tried to get Adcirca for her.  This was denied by her insurance company, so she was started on Revatio 20 mg tid instead.  She thinks that this has helped her breathing "a little."  She saw a pulmonary specialist who thought dyspnea was likely due to [facility].  Next, tried her on ambrisentan, but she developed facial swelling on ambrisentan (significant around the eyes).  She also developed worsening dyspnea.  She stopped ambrisentan and the swelling resolved but she was noted to be volume overloaded.  Switched her Lasix to torsemide.  She was started on selexipag which she has tolerated.    She had a difficult winter with episode of PNA in 2/20.  CT chest/abdomen/pelvis in 4/20 showed RLL  nodule concerning for malignancy and pancreatic cysts, likely indolent cystic pancreatic neoplasm. There was moderate emphysema though she never smoked.  She says that her pulmonologist in [facility] followed up on her CT with a PET scan that was normal.   Echo in 5/21 showed EF 60-65%, mild LVH, PASP 33 mmHg, normal RV, mild MR, mild-moderate AS with mean gradient 14 mmHg, AVA 1.23 cm^2.  PYP scan in 5/21 was not suggestive of TTR amyloidosis.   [name] and had mastectomy (no chemo or radiation).   Echo in 5/22 showed EF 55-60%, mildly decreased RV systolic function, PASP 47, mild MR, mild AS mean gradient 10 mmHg.   She had a fall in 11/22 at home, seems to have been mechanical.  She developed a scalp hematoma and bruising on her arm.  Xarelto was held.  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  Xarelto was restarted.  She was sent to rehab for 3 wks and was not given her torsemide.    Patient was admitted twice at the hospital in [facility] in 4/23, once with diastolic CHF and once with ?TIA.  Workup was negative for recurrent CVA.  Echo in 4/23 showed EF 55-60%, mild LVH, mild RV dilation with decreased systolic function, mild AS with mean gradient 15 mmHg, PASP 33 mmHg.  She stopped Jardiance, does not feel like she could tolerate it.   Echo in 8/23 showed EF 55-60%, mild LVH, normal RV, PASP  42 mmHg, dilated IVC.   She was admitted to [facility] 02/21-02/26 with left femoral neck fracture after a fall and undewrent ORIF with cannulated screws. Course c/b AKI. She was given IV fluids and Torsemide stopped. She was discharged to rehab. Torsemide later restarted and eventually required addition of IV lasix. Underwent right thoracentesis as well. Once discharge from rehab she was readmitted to [facility] for additional diuresis. She was discharged on 80 mg furosemide daily and 50 mg spironolactone daily. Discharge weight 138 lb. Echo during admit with EF  65-70%, severe diastolic dysfunction, RV okay, mild to moderate MR, mild TR, RVSP at least 67 mmHg, IVC dilated with estimated RAP at least 15 mmHg  The patient was admitted to [facility] with SDH on 04/20/22 after a fall at home.  Had been falling at home as well as episodes of hypotension. Neurosurgery consulted and no surgical intervention recommended. Diuretics and pulmonary hypertension meds initially held. At discharge she was sent home on sildenafil 20 mg TID (hypotension on higher doses), lasix 20 mg BID and uptravi 400 mcg BID.  Metoprolol discontinued and Xarelto held until follow-up.  Today she returns for AHF follow up with her caregiver. Overall feeling good. Denies palpitations, CP, dizziness, edema, or PND/Orthopnea. No SOB. Appetite is "too good". No fever or chills. Weight at home 120 pounds. Taking all medications. Orthostatic hypotension has much improved with uptitration of midodrine. Not wearing her abdominal binder, wears her compression socks.   Labs (1/17): ANA positive but only 1:80 titer, dsDNA negative, SSA/B negative, CCP negative.  Labs (1/19): K 4.4, creatinine 1.56, hgb 11.3 Labs (7/19): K 4, creatinine 1.5, BNP 381 Labs (1/20): K 3.8, creatinine 1.38 Labs (11/20): K 3.8, creatinine 1.59 Labs (5/21): K 3.8, creatinine 1.4, myeloma panel negative, urine immunofixation negative Labs (8/21): BNP 319, hgb 10.2, K 4.4, creatinine 1.7 Labs (5/22): LDL 87, TSH normal, hgb 12.6, K 3.9, creatinine 1.26 Labs (11/22): LDL 72 Labs (12/22): K 3.7, creatinine 1.59 Labs (1/23): K 3.7, creatinine 1.30 Labs (3/23): K 3.6, creatinine 1.33 Labs (4/23): K 4, creatinine 1.31, LDL 76, HDL 45 Labs (6/23): K 3.3, creatinine 1.46, BNP 390 Labs (7/23): K 4.2, creaitnine 1.7 Labs (9/23): K 3.9, creatinine 1.4 Labs (11/23): BNP 350 Labs (12/23): K 3.7, creatinine 1.44 Labs (4/24): K 5.1, creatinine 1.92 => 2.25 Labs (5/24): K 3.1, SCr 1.63   PMH: 1. Cardiolite (9/16) with EF 54%, no  ischemia/infarction. 2. Chronic diastolic CHF: Echo (11/16) with EF 55-60%, normal RV size and systolic function, PA systolic pressure 66 mmHg.  - Echo (7/17) with EF 60-65%, mildly dilated RV with mildly decreased systolic function, PA systolic pressure 36 mmHg.  - Echo (7/18) with EF 55-60%, mild to moderate AI, normal RV size and systolic function, PASP 43 mmHg.  - Echo (1/20) with EF 55-60%, normal RV size and systolic function, mild AI, mild MR.  - Echo (5/21) witih EF 60-65%, mild LVH, PASP 33 mmHg, normal RV, mild MR, mild-moderate AS with mean gradient 14 mmHg, AVA 1.23 cm^2.  - PYP scan (5/21): grade 0, H/CL 1.1 (not suggestive of TTR amyloidosis).  - Echo (5/22): EF 55-60%, mildly decreased RV systolic function, PASP 47, mild MR, mild AS mean gradient 10 mmHg. - Echo (4/23): EF 55-60%, mild LVH, mild RV dilation with decreased systolic function, mild AS with mean gradient 15 mmHg, PASP 33 mmHg. - Echo (8/23): EF 55-60%, mild LVH, normal RV, PASP 42 mmHg, dilated IVC - Echo (03/24): EF 65-70%, severe diastolic dysfunction,  RV okay, mild to moderate MR, mild TR, RVSP at least 67 mmHg, IVC dilated with estimated RAP at least 15 mmHg 3. Chronic atrial fibrillation: She has been intolerant of Multaq and propafenone.  Sotalol and flecainide have not been used due to CKD.   4. SBO: Managed conservatively. 5. CKD stage 3 6. OA: h/o THR and TKR.  7. H/o hyponatremia 8. Pneumonia with parapneumonic effusion with thoracenteses and eventual VATS and decortication.  9. Pulmonary hypertension: Suspect Group 1 pulmonary hypertension.  RHC (12/16) with mean RA 5, PA 62/22 mean 40, mean PCWP 19, CI 2.46, PVR 5 WU.  PFTs (12/16) wiith FVC 71%, FEV1 73%, ratio 104%, TLC 71%, DLCO 53% => moderate restriction concerning for interstitial lung disease.  High resolution CT (12/16) did not show evidence for interstitial lung disease.  V/Q scan (12/16) did not show evidence for chronic PE.  Serologies: RF negative,  scleroderma antibodies negative, ANA positive but only 1:80 titer, SSA/B negative, dsDNA negative, CCP negative.  She did not tolerate macitentan or ambrisentan.  Echo 7/17 with PA systolic pressure down to 36 mmHg.   - 6 minute walk (12/16) with 244 meters - 6 minute walk (2/17) with 232 meters - 6 minute walk (3/17) with 414 meters - 6 minute walk (4/17) with 267 meters - 6 minute walk (8/17) with 241 meters - 6 minute walk (10/17) with 256 meters - 6 minute walk (6/18) with 305 meters - 6 minute walk (9/18) with 320 meters - 6 minute walk (1/19) with 213 meters - 6 minute walk (7/19) with 335 meters - 6 minute walk (1/20) with 213 meters - 6 minute walk (10/20) with 243 meters - 6 minute walk (2/21) with 243 meters - 6 minute walk (8/21) with 214 meters - 6 minute walk (2/22) with 260 meters - 6 minute walk (7/22) with 274 meters 10. Lightheadedness/syncope: Extensive workup.  Event and holter monitors as well as telemetry monitoring negative.  She has not been orthostatic.  - Did not tolerate pyridostigmine due to hot flashes.  11. Sciatica: Status post back surgery 1/18.  12. COVID-19 infection 12/20.  13. Peripheral neuropathy 14. Aortic stenosis: Mild to moderate on 5/21 echo.  - Mild on 4/23 echo.  15. Breast cancer: Triple negative on left, s/p mastectomy in 7/21.  16. CVA: 11/22, occurred while off anticoagulation.  - Carotid dopplers (11/22) showed 1-39% BICA stenosis.  17. Traumatic subdural hematoma: 4/24.  18. Autonomic neuropathy with orthostatic hypotension   Current Outpatient Medications  Medication Sig Dispense Refill   amoxicillin (AMOXIL) 500 MG capsule As needed before dental procedures     furosemide (LASIX) 20 MG tablet Take 20 mg by mouth 2 (two) times daily.     midodrine (PROAMATINE) 10 MG tablet Take 1 tablet (10 mg total) by mouth 3 (three) times daily with meals. 180 tablet 3   potassium chloride SA (KLOR-CON M20) 20 MEQ tablet Take 2 tablets (40 mEq  total) by mouth 2 (two) times daily. 120 tablet 3   Selexipag (UPTRAVI) 1000 MCG TABS Take 1,000 mcg by mouth in the morning and at bedtime. 90 tablet 3   sildenafil (REVATIO) 20 MG tablet Take 1 tablet (20 mg total) by mouth 3 (three) times daily. 1080 tablet 3   No current facility-administered medications for this visit.   Allergies:   Acetaminophen, Hydrocodone-acetaminophen, Letairis [ambrisentan], Opsumit [macitentan], Ace inhibitors, Atenolol, Hctz [hydrochlorothiazide], Oxycodone, and Diltiazem hcl   Social History:  The patient  reports that she  has never smoked. She has never used smokeless tobacco. She reports that she does not drink alcohol and does not use drugs.   Family History:  The patient's family history includes Cancer in her mother.   ROS:  Please see the history of present illness.   All other systems are personally reviewed and negative.   Recent Labs: 11/12/2021: Hemoglobin 9.4; Platelets 226 05/11/2022: B Natriuretic Peptide 383.3 05/20/2022: BUN 31; Creatinine, Ser 1.63; Potassium 3.1; Sodium 134  Personally reviewed   Wt Readings from Last 3 Encounters:  05/11/22 58.7 kg (129 lb 6.4 oz)  04/29/22 56.6 kg (124 lb 12.8 oz)  01/19/22 60.1 kg (132 lb 6.4 oz)   Physical Exam:   There were no vitals taken for this visit. General:  elderly appearing.  No respiratory difficulty. Walked in with walker HEENT: normal Neck: supple. JVD ~6 cm. Carotids 2+ bilat; no bruits. No lymphadenopathy or thyromegaly appreciated. Cor: PMI nondisplaced. Regular rate & irregular rhythm. No rubs, gallops or murmurs. Lungs: clear Abdomen: soft, nontender, nondistended. No hepatosplenomegaly. No bruits or masses. Good bowel sounds. Extremities: no cyanosis, clubbing, rash, edema  Neuro: alert & oriented x 3, cranial nerves grossly intact. moves all 4 extremities w/o difficulty. Affect pleasant.   ReDS 35%  Assessment & Plan: 1. Chronic diastolic CHF:  Echo in 8/23 showed EF 55-60%,  mild LVH, normal RV, PASP 42 mmHg, dilated IVC.  Echo at [facility] in 3/24 wit  EF 65-70%, severe diastolic dysfunction, RV normal, mild to moderate MR, mild TR, RVSP at least 67 mmHg, IVC dilated with estimated RAP at least 15 mmHg.  NYHA class III.  - Volume stable, ReDS 35%. Continue Lasix 20 mg bid and 40 mEq KDUR daily - She is now off spironolactone.   - She did not tolerate Jardiance.   - Continue midodrine 10 mg TID - will give prescription for compression socks 2. Pulmonary hypertension: Moderate to severe pulmonary hypertension by echo (PASP 66 mmHg). RHC showed moderate pulmonary arterial hypertension with PVR 5 WU.  PFTs with restrictive changes suggestive of interstitial process but high resolution CT did not show interstitial lung disease.  No evidence for chronic PE on V/Q scan.  Rheumatologic serologies all negative.  Possible group 1 pulmonary hypertension.  She was unable to tolerate macitentan or ambrisentan.  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  She is now on sildenafil 20 mg tid and selexipag 1000 mcg bid, unable to increase selexipag further due to symptoms.  Echo in 8/23 showed normal RV and mildly elevated PA pressure. Echo during admit at [facility] 03/24 with EF 65-70%, severe diastolic dysfunction, RV okay, mild to moderate MR, mild TR, RVSP at least 67 mmHg, IVC dilated with estimated RAP at least 15 mmHg. - Continue Uptravi 1000 mg bid - Continue sildenafil 20 tid.  This was previously decreased due to orthostasis.  3. Pulmonary: s/p PNA with parapneumonic effusion and eventual VATS with decortication.  As above, restrictive PFTs but no ILD on high resolution CT.  CT in 4/20 showed a concerning RLL nodule and emphysema, though she never smoked. Her pulmonologist ordered a PET scan after this per her report, and PET was normal.  4. Atrial fibrillation: Chronic. Off Toprol XL d/t hypotension. HR is controlled.  Xarelto stopped during 4/24 admit with subdural hematoma. She wishes to remain  off anticoagulation due to risk of bleeding and recurrent falls. This is a difficult situation as she has had ischemic stroke off anticoagulation in the past. Would not be  a good candidate for Watchman Device.  - She will remain off Xarelto for now with orthostatic hypotension and falls, SDH.  5. Orthostatic hypotension: She has a long history of this though it has become worse again recently.  - Continue compression stockings.  - Does not want to wear abdominal binder as she feels great on current midodrine dose.  - She did not tolerate pyridostigmine in the past due to hot flashes.  She says she would be willing to try it again if needed.  - Continue midodrine 10 mg tid.  6. CKD: Stage 3b/nearing stage IV.  Scr baseline had been 1.4-1.7. Multiple recent admits and adjustments in diuretics. Scr recently above 2, 2.25 in 4/24.  - BMET today.  7. Aortic stenosis: Mild on echo 03/24 with mean gradient of 16 mmHg.  8. CVA: She had a stroke in 11/22 while off Xarelto.  Carotid dopplers were unremarkable.  Her stroke was atrial fibrillation-related. Recent subdural hematoma and hip fracture after separate mechanical falls. Did not require neurosurgical intervention. Xarelto was discontinued. She does not want to restart anticoagulation d/t risk of bleeding with history of recurrent falls and SDH.   Follow-up 4-6 weeks with Dr. Shirlee Latch.   Alen Bleacher AGACNP-BC  05/26/2022  [facility] [address] Heart and Vascular [place] [address] [phone] (office) [phone] (fax)

## 2022-05-27 ENCOUNTER — Encounter (HOSPITAL_COMMUNITY): Payer: Self-pay

## 2022-05-27 ENCOUNTER — Ambulatory Visit (HOSPITAL_COMMUNITY)
Admission: RE | Admit: 2022-05-27 | Discharge: 2022-05-27 | Disposition: A | Discharge status: Home / Self Care | Payer: Medicare Other | Source: Ambulatory Visit | Attending: Adult Health | Admitting: Adult Health

## 2022-05-27 VITALS — BP 160/80 | HR 90 | Wt 127.0 lb

## 2022-05-27 DIAGNOSIS — I2721 Secondary pulmonary arterial hypertension: Secondary | ICD-10-CM | POA: Insufficient documentation

## 2022-05-27 DIAGNOSIS — I272 Pulmonary hypertension, unspecified: Secondary | ICD-10-CM | POA: Diagnosis not present

## 2022-05-27 DIAGNOSIS — I350 Nonrheumatic aortic (valve) stenosis: Secondary | ICD-10-CM | POA: Diagnosis not present

## 2022-05-27 DIAGNOSIS — I482 Chronic atrial fibrillation, unspecified: Secondary | ICD-10-CM

## 2022-05-27 DIAGNOSIS — N184 Chronic kidney disease, stage 4 (severe): Secondary | ICD-10-CM | POA: Diagnosis not present

## 2022-05-27 DIAGNOSIS — R0609 Other forms of dyspnea: Secondary | ICD-10-CM | POA: Diagnosis not present

## 2022-05-27 DIAGNOSIS — Z79899 Other long term (current) drug therapy: Secondary | ICD-10-CM | POA: Diagnosis not present

## 2022-05-27 DIAGNOSIS — I5032 Chronic diastolic (congestive) heart failure: Secondary | ICD-10-CM | POA: Diagnosis present

## 2022-05-27 DIAGNOSIS — I951 Orthostatic hypotension: Secondary | ICD-10-CM | POA: Diagnosis not present

## 2022-05-27 DIAGNOSIS — R911 Solitary pulmonary nodule: Secondary | ICD-10-CM | POA: Insufficient documentation

## 2022-05-27 DIAGNOSIS — Z8673 Personal history of transient ischemic attack (TIA), and cerebral infarction without residual deficits: Secondary | ICD-10-CM | POA: Diagnosis not present

## 2022-05-27 DIAGNOSIS — I639 Cerebral infarction, unspecified: Secondary | ICD-10-CM

## 2022-05-27 DIAGNOSIS — N1832 Chronic kidney disease, stage 3b: Secondary | ICD-10-CM

## 2022-05-27 DIAGNOSIS — Z853 Personal history of malignant neoplasm of breast: Secondary | ICD-10-CM | POA: Insufficient documentation

## 2022-05-27 DIAGNOSIS — Z7901 Long term (current) use of anticoagulants: Secondary | ICD-10-CM | POA: Insufficient documentation

## 2022-05-27 DIAGNOSIS — J439 Emphysema, unspecified: Secondary | ICD-10-CM | POA: Insufficient documentation

## 2022-05-27 LAB — BASIC METABOLIC PANEL
Anion gap: 13 (ref 5–15)
BUN: 30 mg/dL — ABNORMAL HIGH (ref 8–23)
CO2: 25 mmol/L (ref 22–32)
Calcium: 9.7 mg/dL (ref 8.9–10.3)
Chloride: 96 mmol/L — ABNORMAL LOW (ref 98–111)
Creatinine, Ser: 1.46 mg/dL — ABNORMAL HIGH (ref 0.44–1.00)
GFR, Estimated: 34 mL/min — ABNORMAL LOW (ref >60)
Glucose, Bld: 99 mg/dL (ref 70–99)
Potassium: 4.4 mmol/L (ref 3.5–5.1)
Sodium: 134 mmol/L — ABNORMAL LOW (ref 135–145)

## 2022-05-27 LAB — MAGNESIUM: Magnesium: 2.1 mg/dL (ref 1.7–2.4)

## 2022-05-27 NOTE — Progress Notes (Signed)
ReDS Vest / Clip - 05/27/22 1200       ReDS Vest / Clip   Station Marker C    Ruler Value 24    ReDS Value Range Low volume    Anatomical Comments 35

## 2022-05-27 NOTE — Patient Instructions (Addendum)
Medication Changes:  None  Lab Work:  Labs done today, your results will be available in MyChart, we will contact you for abnormal readings.   Testing/Procedures:  None  Referrals:  None  Special Instructions // Education:  We have given you a prescription for compression stockings. Please take to one of the places listed to get those. Insurance should cover for you.  Follow-Up in: 4-6 weeks with Dr. Shirlee Latch.   July 26th at 11:20am  At the [facility], you and your health needs are our priority. We have a designated team specialized in the treatment of Heart Failure. This Care Team includes your primary Heart Failure Specialized Cardiologist (physician), Advanced Practice Providers (APPs- Physician Assistants and Nurse Practitioners), and Pharmacist who all work together to provide you with the care you need, when you need it.   You may see any of the following providers on your designated Care Team at your next follow up:  Dr. Arvilla Meres Dr. [name] Dr. Marcos Eke, NP Robbie Lis, [place] [facility] [place], [place] Brynda Peon, NP Karle Plumber, PharmD   Please be sure to bring in all your medications bottles to every appointment.   Need to Contact us:  If you have any questions or concerns before your next appointment please send [place] a message through [place] or call our office at [phone].    TO LEAVE A MESSAGE FOR THE NURSE SELECT OPTION 2, PLEASE LEAVE A MESSAGE INCLUDING: YOUR NAME DATE OF BIRTH CALL BACK NUMBER REASON FOR CALL**this is important as we prioritize the call backs  YOU WILL RECEIVE A CALL BACK THE SAME DAY AS LONG AS YOU CALL BEFORE 4:00 PM

## 2022-06-09 ENCOUNTER — Telehealth (HOSPITAL_COMMUNITY): Payer: Self-pay | Admitting: Cardiology

## 2022-06-09 NOTE — Telephone Encounter (Signed)
As long as not symptomatic, no changes.

## 2022-06-09 NOTE — Telephone Encounter (Signed)
HHRN called with orthostatic b/p readings at home visit Sitting 166/78 Standing 129/68 After walking 5 mins 99/54 After sitting x 5 mins 160/80  Pt is compliant with medications (midodrine) Pt does wear compression stockings Pt does not have abdominal binder HOWEVER she does have a back brace to assist with compression  Otherwise asymptomatic  Follow up 07/30/22  Please advise further if needed

## 2022-06-09 NOTE — Telephone Encounter (Signed)
Michaeal,RN with [facility] aware  One symptomatic event over the weekend Where pt experienced worsened vision and felt faint At the time of event SBP in the 70's and DBP in the 50's

## 2022-06-10 ENCOUNTER — Encounter (HOSPITAL_COMMUNITY): Payer: Medicare Other

## 2022-06-14 NOTE — Telephone Encounter (Signed)
Grey with [facility] called to report  B/p sitting 154/70 standing 122/62 Asymptomatic besides event x 1 last weekend    With one event of visual changes and pt feeling faint will forward to provider

## 2022-06-15 NOTE — Telephone Encounter (Signed)
Pt aware Patient is not confident binder will help

## 2022-06-15 NOTE — Telephone Encounter (Signed)
Confirmed with [employer] [phone] Abdominal binder is available at this location No order is needed as unable to file with insurance Price varies $26-$45   Pt aware of information above and will have family take her soon

## 2022-07-30 ENCOUNTER — Encounter (HOSPITAL_COMMUNITY): Payer: Self-pay | Admitting: Cardiology

## 2022-07-30 ENCOUNTER — Ambulatory Visit (HOSPITAL_COMMUNITY): Admission: RE | Admit: 2022-07-30 | Payer: Medicare Other | Source: Ambulatory Visit | Admitting: Cardiology

## 2022-07-30 VITALS — BP 140/80 | HR 75 | Wt 130.0 lb

## 2022-07-30 DIAGNOSIS — Z7901 Long term (current) use of anticoagulants: Secondary | ICD-10-CM | POA: Diagnosis not present

## 2022-07-30 DIAGNOSIS — Z9181 History of falling: Secondary | ICD-10-CM | POA: Diagnosis not present

## 2022-07-30 DIAGNOSIS — J439 Emphysema, unspecified: Secondary | ICD-10-CM | POA: Insufficient documentation

## 2022-07-30 DIAGNOSIS — I951 Orthostatic hypotension: Secondary | ICD-10-CM | POA: Diagnosis not present

## 2022-07-30 DIAGNOSIS — R232 Flushing: Secondary | ICD-10-CM | POA: Diagnosis not present

## 2022-07-30 DIAGNOSIS — Z79899 Other long term (current) drug therapy: Secondary | ICD-10-CM | POA: Insufficient documentation

## 2022-07-30 DIAGNOSIS — I2721 Secondary pulmonary arterial hypertension: Secondary | ICD-10-CM | POA: Insufficient documentation

## 2022-07-30 DIAGNOSIS — Z8673 Personal history of transient ischemic attack (TIA), and cerebral infarction without residual deficits: Secondary | ICD-10-CM | POA: Diagnosis not present

## 2022-07-30 DIAGNOSIS — I482 Chronic atrial fibrillation, unspecified: Secondary | ICD-10-CM | POA: Diagnosis not present

## 2022-07-30 DIAGNOSIS — N1832 Chronic kidney disease, stage 3b: Secondary | ICD-10-CM | POA: Insufficient documentation

## 2022-07-30 DIAGNOSIS — I350 Nonrheumatic aortic (valve) stenosis: Secondary | ICD-10-CM | POA: Insufficient documentation

## 2022-07-30 DIAGNOSIS — R410 Disorientation, unspecified: Secondary | ICD-10-CM | POA: Diagnosis not present

## 2022-07-30 DIAGNOSIS — I5032 Chronic diastolic (congestive) heart failure: Secondary | ICD-10-CM | POA: Diagnosis not present

## 2022-07-30 MED ORDER — ASPIRIN 81 MG PO TBEC: 81.0000 mg | DELAYED_RELEASE_TABLET | Freq: Every day | ORAL | 8 refills | Status: DC

## 2022-07-30 MED ORDER — TORSEMIDE 20 MG PO TABS: 40.0000 mg | ORAL_TABLET | Freq: Two times a day (BID) | ORAL | 5 refills | Status: DC

## 2022-07-30 MED ORDER — MIDODRINE HCL 10 MG PO TABS: 10.0000 mg | ORAL_TABLET | Freq: Three times a day (TID) | ORAL | 3 refills | Status: DC

## 2022-07-30 NOTE — Patient Instructions (Signed)
Good to see you today!  Keep Torsemide at 40 mg Twice daily  START Baby aspirin 81 mg daily  Your physician has requested that you have an echocardiogram. Echocardiography is a painless test that uses sound waves to create images of your heart. It provides your doctor with information about the size and shape of your heart and how well your heart's chambers and valves are working. This procedure takes approximately one hour. There are no restrictions for this procedure. Please do NOT wear cologne, perfume, aftershave, or lotions (deodorant is allowed). Please arrive 15 minutes prior to your appointment time.  Your physician recommends that you schedule a follow-up appointment in: 3 months with echocardiogram   If you have any questions or concerns before your next appointment please send [place] a message through [place] or call our office at [phone].    TO LEAVE A MESSAGE FOR THE NURSE SELECT OPTION 2, PLEASE LEAVE A MESSAGE INCLUDING: YOUR NAME DATE OF BIRTH CALL BACK NUMBER REASON FOR CALL**this is important as we prioritize the call backs  YOU WILL RECEIVE A CALL BACK THE SAME DAY AS LONG AS YOU CALL BEFORE 4:00 PM  At the [facility], you and your health needs are our priority. As part of our continuing mission to provide you with exceptional heart care, we have created designated Provider Care Teams. These Care Teams include your primary Cardiologist (physician) and Advanced Practice Providers (APPs- Physician Assistants and Nurse Practitioners) who all work together to provide you with the care you need, when you need it.   You may see any of the following providers on your designated Care Team at your next follow up: Dr Arvilla Meres Dr [name] Dr. Marcos Eke, NP Robbie Lis, [place] [facility] [place], [place] Brynda Peon, NP Karle Plumber, PharmD   Please be sure to bring in all your medications bottles to every  appointment.    Thank you for choosing [facility]

## 2022-08-01 NOTE — Progress Notes (Signed)
[facility] Note   Date:  08/01/2022   ID:  Rachael Myers, DOB 07-30-1929, MRN [id number]  Provider location: [place] [facility] Type of Visit: Established patient  PCP:  Dan Maker., MD  HF Cardiologist:  Dr. Shirlee Latch   HPI: Rachael Myers is a 89 y.o. female who has a history of chronic diastolic CHF, chronic atrial fibrillation (failed multaq and propafenone), and prominent exertional dyspnea.  In 1/16, she developed PNA.  She had a parapneumonic effusion that required 4 thoracenteses.  Eventually, she had VATS with decortication and a wedge resection.  Cytology was negative from the pleural fluid.  Echo 11/16: normal LV EF and apparently normal RV EF.  PA systolic pressure estimation was moderate to severely increased.  RHC 12/16: moderate pulmonary arterial hypertension.  PFTs showed a restrictive pattern concerning for interstitial lung disease.  V/Q scan in 12/16 was not suggestive of chronic PE.  Despite abnormal PFTs, high resolution CT did not show interstitial lung disease.     At a prior appointment, started her on Opsumit.  She developed a cough and mouth soreness with this medication and had to stop it after about 7 days.  Symptoms resolved when she stopped it.  Tried to get Adcirca for her.  This was denied by her insurance company, so she was started on Revatio 20 mg tid instead.  She thinks that this has helped her breathing "a little."  She saw a pulmonary specialist who thought dyspnea was likely due to [facility].  Next, tried her on ambrisentan, but she developed facial swelling on ambrisentan (significant around the eyes).  She also developed worsening dyspnea.  She stopped ambrisentan and the swelling resolved but she was noted to be volume overloaded.  Switched her Lasix to torsemide.  She was started on selexipag which she has tolerated.    She had a difficult winter with episode of PNA in 2/20.  CT chest/abdomen/pelvis in 4/20 showed RLL  nodule concerning for malignancy and pancreatic cysts, likely indolent cystic pancreatic neoplasm. There was moderate emphysema though she never smoked.  She says that her pulmonologist in [facility] followed up on her CT with a PET scan that was normal.   Echo in 5/21 showed EF 60-65%, mild LVH, PASP 33 mmHg, normal RV, mild MR, mild-moderate AS with mean gradient 14 mmHg, AVA 1.23 cm^2.  PYP scan in 5/21 was not suggestive of TTR amyloidosis.   [name] and had mastectomy (no chemo or radiation).   Echo in 5/22 showed EF 55-60%, mildly decreased RV systolic function, PASP 47, mild MR, mild AS mean gradient 10 mmHg.   She had a fall in 11/22 at home, seems to have been mechanical.  She developed a scalp hematoma and bruising on her arm.  Xarelto was held.  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  Xarelto was restarted.  She was sent to rehab for 3 wks and was not given her torsemide.    Patient was admitted twice at the hospital in [facility] in 4/23, once with diastolic CHF and once with ?TIA.  Workup was negative for recurrent CVA.  Echo in 4/23 showed EF 55-60%, mild LVH, mild RV dilation with decreased systolic function, mild AS with mean gradient 15 mmHg, PASP 33 mmHg.  She stopped Jardiance, does not feel like she could tolerate it.   Echo in 8/23 showed EF 55-60%, mild LVH,  normal RV, PASP 42 mmHg, dilated IVC.   She was admitted to [facility] 02/21-02/26 with left femoral neck fracture after a fall and undewrent ORIF with cannulated screws. Course c/b AKI. She was given IV fluids and Torsemide stopped. She was discharged to rehab. Torsemide later restarted and eventually required addition of IV lasix. Underwent right thoracentesis as well. Once discharge from rehab she was readmitted to [facility] for additional diuresis. She was discharged on 80 mg furosemide daily and 50 mg spironolactone daily. Discharge weight 138 lb. Echo during admit with EF  65-70%, severe diastolic dysfunction, RV okay, mild to moderate MR, mild TR, RVSP at least 67 mmHg, IVC dilated with estimated RAP at least 15 mmHg  The patient was admitted to [facility] with SDH on 04/20/22 after a fall at home.  Had been falling at home as well as episodes of hypotension. Neurosurgery consulted and no surgical intervention recommended. Diuretics and pulmonary hypertension meds initially held. At discharge she was sent home on sildenafil 20 mg TID (hypotension on higher doses), lasix 20 mg BID and uptravi 400 mcg BID.  Metoprolol discontinued and Xarelto stopped as well.  She returns for followup of pulmonary hypertension, CHF, and orthostatic hypotension.  Orthostatic symptoms seem to be better on current midodrine 10 tid.  Her BP readings are generally ok though occasionally will spike up to SBP 170s.  She has not had anymore falls.  She saw her neurosurgeon recently who cleared her to restart at least ASA 81 daily and possibly a DOAC if we think that her fall risk is reasonably controlled.  She is very concerned about restarting a DOAC. She is walking with a walker, still feels unsteady  at times.  No dyspnea walking around the house with her walker.  No chest pain.    ECG (personally reviewed): atrial fibrillation, LAFB, LVH   Labs (9/16): K 4, creatinine 1.31, BNP 466 Labs (11/16): BNP 577 Labs (12/16); K 4, creatinine 1.3, normal rheumatoid factor and scleroderma serologies Labs (1/17): ANA positive but only 1:80 titer, dsDNA negative, SSA/B negative, CCP negative.  Labs (2/17): K 4.1, creatinine 1.48 => 1.42, BNP 487 Labs (3/17): K 4.2, creatinine 1.58, BNP 326 Labs (7/17): K 3.3, creatinine 1.7, hgb 10.7 Labs (8/17): K 3.9, creatinine 1.87, HCT 35.6 Labs (1/18): hgb 9.9, creatinine 1.42 Labs (4/18): TSH normal, hgb 10.5, K 3.8, creatinine 1.34 Labs (6/18): BNP 415, K 4.9, creatinine 1.45 Labs (1/19): K 4.4, creatinine 1.56, hgb 11.3 Labs (7/19): K 4, creatinine 1.5, BNP  381 Labs (1/20): K 3.8, creatinine 1.38 Labs (11/20): K 3.8, creatinine 1.59 Labs (5/21): K 3.8, creatinine 1.4, myeloma panel negative, urine immunofixation negative Labs (8/21): BNP 319, hgb 10.2, K 4.4, creatinine 1.7 Labs (5/22): LDL 87, TSH normal, hgb 12.6, K 3.9, creatinine 1.26 Labs (11/22): LDL 72 Labs (12/22): K 3.7, creatinine 1.59 Labs (1/23): K 3.7, creatinine 1.30 Labs (3/23): K 3.6, creatinine 1.33 Labs (4/23): K 4, creatinine 1.31, LDL 76, HDL 45 Labs (6/23): K 3.3, creatinine 1.46, BNP 390 Labs (7/23): K 4.2, creaitnine 1.7 Labs (9/23): K 3.9, creatinine 1.4 Labs (11/23): BNP 350 Labs (12/23): K 3.7, creatinine 1.44 Labs (4/24): K 5.1, creatinine 1.92 => 2.25 Labs (5/24): BNP 383 Labs (7/24): K 3.9, creatinine 1.35   PMH: 1. Cardiolite (9/16) with EF 54%, no ischemia/infarction. 2. Chronic diastolic CHF: Echo (11/16) with EF 55-60%, normal RV size and systolic function, PA systolic pressure 66 mmHg.  - Echo (7/17) with EF 60-65%, mildly dilated RV with  mildly decreased systolic function, PA systolic pressure 36 mmHg.  - Echo (7/18) with EF 55-60%, mild to moderate AI, normal RV size and systolic function, PASP 43 mmHg.  - Echo (1/20) with EF 55-60%, normal RV size and systolic function, mild AI, mild MR.  - Echo (5/21) witih EF 60-65%, mild LVH, PASP 33 mmHg, normal RV, mild MR, mild-moderate AS with mean gradient 14 mmHg, AVA 1.23 cm^2.  - PYP scan (5/21): grade 0, H/CL 1.1 (not suggestive of TTR amyloidosis).  - Echo (5/22): EF 55-60%, mildly decreased RV systolic function, PASP 47, mild MR, mild AS mean gradient 10 mmHg. - Echo (4/23): EF 55-60%, mild LVH, mild RV dilation with decreased systolic function, mild AS with mean gradient 15 mmHg, PASP 33 mmHg. - Echo (8/23): EF 55-60%, mild LVH, normal RV, PASP 42 mmHg, dilated IVC - Echo (03/24): EF 65-70%, severe diastolic dysfunction, RV okay, mild to moderate MR, mild TR, RVSP at least 67 mmHg, IVC dilated with  estimated RAP at least 15 mmHg 3. Chronic atrial fibrillation: She has been intolerant of Multaq and propafenone.  Sotalol and flecainide have not been used due to CKD.   4. SBO: Managed conservatively. 5. CKD stage 3 6. OA: h/o THR and TKR.  7. H/o hyponatremia 8. Pneumonia with parapneumonic effusion with thoracenteses and eventual VATS and decortication.  9. Pulmonary hypertension: Suspect Group 1 pulmonary hypertension.  RHC (12/16) with mean RA 5, PA 62/22 mean 40, mean PCWP 19, CI 2.46, PVR 5 WU.  PFTs (12/16) wiith FVC 71%, FEV1 73%, ratio 104%, TLC 71%, DLCO 53% => moderate restriction concerning for interstitial lung disease.  High resolution CT (12/16) did not show evidence for interstitial lung disease.  V/Q scan (12/16) did not show evidence for chronic PE.  Serologies: RF negative, scleroderma antibodies negative, ANA positive but only 1:80 titer, SSA/B negative, dsDNA negative, CCP negative.  She did not tolerate macitentan or ambrisentan.  Echo 7/17 with PA systolic pressure down to 36 mmHg.   - 6 minute walk (12/16) with 244 meters - 6 minute walk (2/17) with 232 meters - 6 minute walk (3/17) with 414 meters - 6 minute walk (4/17) with 267 meters - 6 minute walk (8/17) with 241 meters - 6 minute walk (10/17) with 256 meters - 6 minute walk (6/18) with 305 meters - 6 minute walk (9/18) with 320 meters - 6 minute walk (1/19) with 213 meters - 6 minute walk (7/19) with 335 meters - 6 minute walk (1/20) with 213 meters - 6 minute walk (10/20) with 243 meters - 6 minute walk (2/21) with 243 meters - 6 minute walk (8/21) with 214 meters - 6 minute walk (2/22) with 260 meters - 6 minute walk (7/22) with 274 meters 10. Lightheadedness/syncope: Extensive workup.  Event and holter monitors as well as telemetry monitoring negative.  She has not been orthostatic.  - Did not tolerate pyridostigmine due to hot flashes.  11. Sciatica: Status post back surgery 1/18.  12. COVID-19 infection  12/20.  13. Peripheral neuropathy 14. Aortic stenosis: Mild to moderate on 5/21 echo.  - Mild on 4/23 echo.  15. Breast cancer: Triple negative on left, s/p mastectomy in 7/21.  16. CVA: 11/22, occurred while off anticoagulation.  - Carotid dopplers (11/22) showed 1-39% BICA stenosis.  17. Traumatic subdural hematoma: 4/24.  18. Autonomic neuropathy with orthostatic hypotension   Current Outpatient Medications  Medication Sig Dispense Refill   amoxicillin (AMOXIL) 500 MG capsule As needed before dental  procedures     aspirin EC 81 MG tablet Take 1 tablet (81 mg total) by mouth daily. Swallow whole. 30 tablet 8   potassium chloride SA (KLOR-CON M20) 20 MEQ tablet Take 2 tablets (40 mEq total) by mouth 2 (two) times daily. 120 tablet 3   Selexipag (UPTRAVI) 1000 MCG TABS Take 1,000 mcg by mouth in the morning and at bedtime. 90 tablet 3   sildenafil (REVATIO) 20 MG tablet Take 1 tablet (20 mg total) by mouth 3 (three) times daily. 1080 tablet 3   midodrine (PROAMATINE) 10 MG tablet Take 1 tablet (10 mg total) by mouth 3 (three) times daily with meals. 180 tablet 3   torsemide (DEMADEX) 20 MG tablet Take 2 tablets (40 mg total) by mouth 2 (two) times daily. 60 tablet 5   No current facility-administered medications for this encounter.   Allergies:   Acetaminophen, Hydrocodone-acetaminophen, Letairis [ambrisentan], Opsumit [macitentan], Ace inhibitors, Atenolol, Hctz [hydrochlorothiazide], Oxycodone, and Diltiazem hcl   Social History:  The patient  reports that she has never smoked. She has never used smokeless tobacco. She reports that she does not drink alcohol and does not use drugs.   Family History:  The patient's family history includes Cancer in her mother.   ROS:  Please see the history of present illness.   All other systems are personally reviewed and negative.   Recent Labs: 11/12/2021: Hemoglobin 9.4; Platelets 226 05/11/2022: B Natriuretic Peptide 383.3 05/27/2022: BUN 30;  Creatinine, Ser 1.46; Magnesium 2.1; Potassium 4.4; Sodium 134  Personally reviewed   Wt Readings from Last 3 Encounters:  07/30/22 59 kg (130 lb)  05/27/22 57.6 kg (127 lb)  05/11/22 58.7 kg (129 lb 6.4 oz)   Physical Exam:   BP (!) 140/80   Pulse 75   Wt 59 kg (130 lb)   SpO2 97%   BMI 20.36 kg/m  General: NAD Neck: No JVD, no thyromegaly or thyroid nodule.  Lungs: Clear to auscultation bilaterally with normal respiratory effort. CV: Nondisplaced PMI.  Heart regular S1/S2, no S3/S4, 3/6 SEM RUSB.  No peripheral edema.  No carotid bruit.  Normal pedal pulses.  Abdomen: Soft, nontender, no hepatosplenomegaly, no distention.  Skin: Intact without lesions or rashes.  Neurologic: Alert and oriented x 3.  Psych: Normal affect. Extremities: No clubbing or cyanosis.  HEENT: Normal.   Assessment & Plan: 1. Chronic diastolic CHF:  Echo in 8/23 showed EF 55-60%, mild LVH, normal RV, PASP 42 mmHg, dilated IVC.  Echo at [facility] in 3/24 with EF 65-70%, severe diastolic dysfunction, RV normal, mild to moderate MR, mild TR, RVSP at least 67 mmHg, IVC dilated with estimated RAP at least 15 mmHg.  NYHA class II-III symptoms, stable.  She does not look significantly volume overloaded today.  - Continue torsemide 40 mg bid.  There has been some confusion as to which diuretic she is taking, but this looks like her current regimen. Recent BMET was stable.  - She is now off spironolactone.   - She did not tolerate Jardiance.   2. Pulmonary hypertension: Moderate to severe pulmonary hypertension by echo (PASP 66 mmHg). RHC showed moderate pulmonary arterial hypertension with PVR 5 WU.  PFTs with restrictive changes suggestive of interstitial process but high resolution CT did not show interstitial lung disease.  No evidence for chronic PE on V/Q scan.  Rheumatologic serologies all negative.  Possible group 1 pulmonary hypertension.  She was unable to tolerate macitentan or ambrisentan.  Insurance would not  approve  Adcirca.  She is now on sildenafil 20 mg tid and selexipag 1000 mcg bid, unable to increase selexipag further due to symptoms.  Echo in 8/23 showed normal RV and mildly elevated PA pressure. Echo during admit at [facility] 03/24 with EF 65-70%, severe diastolic dysfunction, RV okay, mild to moderate MR, mild TR, RVSP at least 67 mmHg, IVC dilated with estimated RAP at least 15 mmHg. - Continue Uptravi 1000 mg bid.  - Continue sildenafil 20 tid.  This was decreased due to orthostasis.  3. Pulmonary: s/p PNA with parapneumonic effusion and eventual VATS with decortication.  As above, restrictive PFTs but no ILD on high resolution CT.  CT in 4/20 showed a concerning RLL nodule and emphysema, though she never smoked. Her pulmonologist ordered a PET scan after this per her report, and PET was normal.  4. Atrial fibrillation: Chronic. Off Toprol XL d/t hypotension. HR is controlled.  Xarelto stopped during 4/24 admit with subdural hematoma.  This is a difficult situation as she has had ischemic stroke off anticoagulation in the past. Would not be a good candidate for Watchman Device.  She saw neurosurgery recently and was cleared to restart ASA 81 or possibly DOAC if we felt that fall risk was controlled.  She has had no recent falls and orthostatic symptoms are improved on midodrine, but she still feels unstable on her feet and feels unsafe restarting DOAC.  I do think that her stroke risk remains greater than her risk from falls at this time.   - She is willing to restart ASA 81 given prior history of CVA.  - She does not want to start DOAC yet.  We will follow for now, if she remains free from severe falls at followup, would recommend restarting.  5. Orthostatic hypotension: She has a long history of this, worsened earlier this year culminating in fall with SDH.  Doing better now on midodrine 10 tid, she was not orthostatic when checked today.  - Continue compression stockings.  - She did not tolerate  pyridostigmine in the past due to hot flashes.  She says she would be willing to try it again if needed.  - Continue midodrine 10 mg tid. I will tolerate occasional spike in BP as this seems to be preventing severe orthostasis and falls.  6. CKD: Stage 3b.  Creatinine 1.35 in 7/24.  7. Aortic stenosis: Mild on echo 03/24 with mean gradient of 16 mmHg. Has AS murmur.  8. CVA: She had a stroke in 11/22 while off Xarelto.  Carotid dopplers were unremarkable.  Her stroke was atrial fibrillation-related. This year had subdural hematoma and hip fracture after separate mechanical falls. Did not require neurosurgical intervention. Xarelto was discontinued.  - We discussed this today. She does not want to restart anticoagulation yet given fall risk.   - She is willing to start ASA 81 - Will readdress anticoagulation at next appointment if she remains free from severe falls.   Followup in 3 months with echo.   [name] 08/01/2022  [facility] [address] [facility] [place] [address] [phone] (office) [phone] (fax)

## 2022-10-11 ENCOUNTER — Other Ambulatory Visit (HOSPITAL_COMMUNITY): Payer: Self-pay | Admitting: Pharmacist

## 2022-10-11 MED ORDER — SILDENAFIL CITRATE 20 MG PO TABS: 20.0000 mg | ORAL_TABLET | Freq: Three times a day (TID) | ORAL | 3 refills | Status: DC

## 2022-11-22 ENCOUNTER — Ambulatory Visit (HOSPITAL_COMMUNITY)
Admission: RE | Admit: 2022-11-22 | Discharge: 2022-11-22 | Disposition: A | Discharge status: Home / Self Care | Payer: Medicare Other | Source: Ambulatory Visit | Attending: Cardiology | Admitting: Cardiology

## 2022-11-22 ENCOUNTER — Inpatient Hospital Stay (HOSPITAL_COMMUNITY)
Admission: RE | Admit: 2022-11-22 | Discharge: 2022-11-22 | Discharge status: Home / Self Care | Payer: Medicare Other | Source: Ambulatory Visit | Attending: Cardiology | Admitting: Cardiology

## 2022-11-22 ENCOUNTER — Encounter (HOSPITAL_COMMUNITY): Payer: Self-pay | Admitting: Cardiology

## 2022-11-22 VITALS — BP 160/80 | HR 74 | Wt 131.8 lb

## 2022-11-22 DIAGNOSIS — I452 Bifascicular block: Secondary | ICD-10-CM | POA: Diagnosis not present

## 2022-11-22 DIAGNOSIS — I272 Pulmonary hypertension, unspecified: Secondary | ICD-10-CM | POA: Diagnosis not present

## 2022-11-22 DIAGNOSIS — I5032 Chronic diastolic (congestive) heart failure: Secondary | ICD-10-CM

## 2022-11-22 DIAGNOSIS — I080 Rheumatic disorders of both mitral and aortic valves: Secondary | ICD-10-CM | POA: Insufficient documentation

## 2022-11-22 DIAGNOSIS — I4892 Unspecified atrial flutter: Secondary | ICD-10-CM | POA: Insufficient documentation

## 2022-11-22 DIAGNOSIS — I110 Hypertensive heart disease with heart failure: Secondary | ICD-10-CM | POA: Diagnosis not present

## 2022-11-22 LAB — BASIC METABOLIC PANEL
Anion gap: 14 (ref 5–15)
BUN: 22 mg/dL (ref 8–23)
CO2: 23 mmol/L (ref 22–32)
Calcium: 9.6 mg/dL (ref 8.9–10.3)
Chloride: 100 mmol/L (ref 98–111)
Creatinine, Ser: 1.39 mg/dL — ABNORMAL HIGH (ref 0.44–1.00)
GFR, Estimated: 36 mL/min — ABNORMAL LOW (ref >60)
Glucose, Bld: 95 mg/dL (ref 70–99)
Potassium: 3.5 mmol/L (ref 3.5–5.1)
Sodium: 137 mmol/L (ref 135–145)

## 2022-11-22 LAB — ECHOCARDIOGRAM COMPLETE
AR max vel: 1.21 cm2
AV Area VTI: 1.19 cm2
AV Area mean vel: 1.14 cm2
AV Mean grad: 10.7 mm[Hg]
AV Peak grad: 18.5 mm[Hg]
Ao pk vel: 2.15 m/s
Area-P 1/2: 4.6 cm2
S' Lateral: 2.5 cm

## 2022-11-22 LAB — CBC
HCT: 40.7 % (ref 36.0–46.0)
Hemoglobin: 13.2 g/dL (ref 12.0–15.0)
MCH: 29.7 pg (ref 26.0–34.0)
MCHC: 32.4 g/dL (ref 30.0–36.0)
MCV: 91.5 fL (ref 80.0–100.0)
Platelets: 212 10*3/uL (ref 150–400)
RBC: 4.45 MIL/uL (ref 3.87–5.11)
RDW: 14.2 % (ref 11.5–15.5)
WBC: 7.3 10*3/uL (ref 4.0–10.5)
nRBC: 0 % (ref 0.0–0.2)

## 2022-11-22 LAB — BRAIN NATRIURETIC PEPTIDE: B Natriuretic Peptide: 333.7 pg/mL — ABNORMAL HIGH (ref 0.0–100.0)

## 2022-11-22 MED ORDER — APIXABAN 2.5 MG PO TABS: 2.5000 mg | ORAL_TABLET | Freq: Two times a day (BID) | ORAL | 5 refills | Status: DC

## 2022-11-22 NOTE — Patient Instructions (Signed)
Good to see you today!  START Eliquis 2.5 mg Twice daily  STOP Aspirin  Labs done today, your results will be available in MyChart, we will contact you for abnormal readings.  Your physician recommends that you schedule a follow-up appointment in: 4 months with app clinic  If you have any questions or concerns before your next appointment please send [place] a message through [place] or call our office at [phone].    TO LEAVE A MESSAGE FOR THE NURSE SELECT OPTION 2, PLEASE LEAVE A MESSAGE INCLUDING: YOUR NAME DATE OF BIRTH CALL BACK NUMBER REASON FOR CALL**this is important as we prioritize the call backs  YOU WILL RECEIVE A CALL BACK THE SAME DAY AS LONG AS YOU CALL BEFORE 4:00 PM  At the [facility], you and your health needs are our priority. As part of our continuing mission to provide you with exceptional heart care, we have created designated Provider Care Teams. These Care Teams include your primary Cardiologist (physician) and Advanced Practice Providers (APPs- Physician Assistants and Nurse Practitioners) who all work together to provide you with the care you need, when you need it.   You may see any of the following providers on your designated Care Team at your next follow up: Dr Arvilla Meres Dr [name] Dr. Dorthula Nettles Dr. Clearnce Hasten Amy Filbert Schilder, NP Robbie Lis, [place] [facility] [place], [place] Brynda Peon, NP [place] Lee, NP Karle Plumber, PharmD   Please be sure to bring in all your medications bottles to every appointment.    Thank you for choosing [facility]

## 2022-11-23 ENCOUNTER — Other Ambulatory Visit (HOSPITAL_COMMUNITY): Payer: Self-pay

## 2022-11-23 MED ORDER — TORSEMIDE 20 MG PO TABS: 40.0000 mg | ORAL_TABLET | Freq: Two times a day (BID) | ORAL | 5 refills | Status: DC

## 2022-11-23 MED ORDER — MIDODRINE HCL 10 MG PO TABS: 10.0000 mg | ORAL_TABLET | ORAL | 3 refills | Status: AC | PRN

## 2022-11-23 MED ORDER — MIDODRINE HCL 10 MG PO TABS: 10.0000 mg | ORAL_TABLET | ORAL | 3 refills | Status: DC | PRN

## 2022-11-23 MED ORDER — POTASSIUM CHLORIDE CRYS ER 20 MEQ PO TBCR: 40.0000 meq | EXTENDED_RELEASE_TABLET | Freq: Two times a day (BID) | ORAL | 3 refills | Status: DC

## 2022-11-23 MED ORDER — UPTRAVI 1000 MCG PO TABS: 1000.0000 ug | ORAL_TABLET | Freq: Two times a day (BID) | ORAL | 3 refills | Status: DC

## 2022-11-23 MED ORDER — APIXABAN 2.5 MG PO TABS: 2.5000 mg | ORAL_TABLET | Freq: Two times a day (BID) | ORAL | 5 refills | Status: DC

## 2022-11-23 NOTE — Progress Notes (Signed)
[facility] Note   Date:  11/23/2022   ID:  Rachael Myers, DOB July 11, 1929, MRN [id number]  Provider location: [place] [facility] Type of Visit: Established patient  PCP:  Dan Maker., MD  HF Cardiologist:  Dr. Shirlee Latch   HPI: Rachael Myers is a 89 y.o. female who has a history of chronic diastolic CHF, chronic atrial fibrillation (failed multaq and propafenone), and prominent exertional dyspnea.  In 1/16, she developed PNA.  She had a parapneumonic effusion that required 4 thoracenteses.  Eventually, she had VATS with decortication and a wedge resection.  Cytology was negative from the pleural fluid.  Echo 11/16: normal LV EF and apparently normal RV EF.  PA systolic pressure estimation was moderate to severely increased.  RHC 12/16: moderate pulmonary arterial hypertension.  PFTs showed a restrictive pattern concerning for interstitial lung disease.  V/Q scan in 12/16 was not suggestive of chronic PE.  Despite abnormal PFTs, high resolution CT did not show interstitial lung disease.     At a prior appointment, started her on Opsumit.  She developed a cough and mouth soreness with this medication and had to stop it after about 7 days.  Symptoms resolved when she stopped it.  Tried to get Adcirca for her.  This was denied by her insurance company, so she was started on Revatio 20 mg tid instead.  She thinks that this has helped her breathing "a little."  She saw a pulmonary specialist who thought dyspnea was likely due to [facility].  Next, tried her on ambrisentan, but she developed facial swelling on ambrisentan (significant around the eyes).  She also developed worsening dyspnea.  She stopped ambrisentan and the swelling resolved but she was noted to be volume overloaded.  Switched her Lasix to torsemide.  She was started on selexipag which she has tolerated.    She had a difficult winter with episode of PNA in 2/20.  CT chest/abdomen/pelvis in 4/20 showed RLL  nodule concerning for malignancy and pancreatic cysts, likely indolent cystic pancreatic neoplasm. There was moderate emphysema though she never smoked.  She says that her pulmonologist in [facility] followed up on her CT with a PET scan that was normal.   Echo in 5/21 showed EF 60-65%, mild LVH, PASP 33 mmHg, normal RV, mild MR, mild-moderate AS with mean gradient 14 mmHg, AVA 1.23 cm^2.  PYP scan in 5/21 was not suggestive of TTR amyloidosis.   [name] and had mastectomy (no chemo or radiation).   Echo in 5/22 showed EF 55-60%, mildly decreased RV systolic function, PASP 47, mild MR, mild AS mean gradient 10 mmHg.   She had a fall in 11/22 at home, seems to have been mechanical.  She developed a scalp hematoma and bruising on her arm.  Xarelto was held.  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  Xarelto was restarted.  She was sent to rehab for 3 wks and was not given her torsemide.    Patient was admitted twice at the hospital in [facility] in 4/23, once with diastolic CHF and once with ?TIA.  Workup was negative for recurrent CVA.  Echo in 4/23 showed EF 55-60%, mild LVH, mild RV dilation with decreased systolic function, mild AS with mean gradient 15 mmHg, PASP 33 mmHg.  She stopped Jardiance, does not feel like she could tolerate it.   Echo in 8/23 showed EF 55-60%, mild LVH,  normal RV, PASP 42 mmHg, dilated IVC.   She was admitted to [facility] 02/21-02/26 with left femoral neck fracture after a fall and undewrent ORIF with cannulated screws. Course c/b AKI. She was given IV fluids and Torsemide stopped. She was discharged to rehab. Torsemide later restarted and eventually required addition of IV lasix. Underwent right thoracentesis as well. Once discharge from rehab she was readmitted to [facility] for additional diuresis. She was discharged on 80 mg furosemide daily and 50 mg spironolactone daily. Discharge weight 138 lb. Echo during admit with EF  65-70%, severe diastolic dysfunction, RV okay, mild to moderate MR, mild TR, RVSP at least 67 mmHg, IVC dilated with estimated RAP at least 15 mmHg  The patient was admitted to [facility] with SDH on 04/20/22 after a fall at home.  Had been falling at home as well as episodes of hypotension. Neurosurgery consulted and no surgical intervention recommended. Diuretics and pulmonary hypertension meds initially held. At discharge she was sent home on sildenafil 20 mg TID (hypotension on higher doses), lasix 20 mg BID and uptravi 400 mcg BID.  Metoprolol discontinued and Xarelto stopped as well.  Echo was done today and reviewed, EF 60-65%, mild aortic stenosis with mean gradient 10 mmHg, mildly decreased RV systolic function, mild MR, PASP 50 mmHg.   She returns for followup of pulmonary hypertension, CHF, and orthostatic hypotension.  BP readings still vary widely, from SBP 90s-180s.  When SBP < 110, she feels lightheaded.  She will take midodrine prn low BP.  She has had no further falls and seems to be stable with her walker.  No dyspnea walking around her house or short distances outside the house.  No chest pain. No orthopnea/PND.    ECG (personally reviewed): atrial fibrillation, LAFB, LVH, iRBBB   Labs (9/16): K 4, creatinine 1.31, BNP 466 Labs (11/16): BNP 577 Labs (12/16); K 4, creatinine 1.3, normal rheumatoid factor and scleroderma serologies Labs (1/17): ANA positive but only 1:80 titer, dsDNA negative, SSA/B negative, CCP negative.  Labs (2/17): K 4.1, creatinine 1.48 => 1.42, BNP 487 Labs (3/17): K 4.2, creatinine 1.58, BNP 326 Labs (7/17): K 3.3, creatinine 1.7, hgb 10.7 Labs (8/17): K 3.9, creatinine 1.87, HCT 35.6 Labs (1/18): hgb 9.9, creatinine 1.42 Labs (4/18): TSH normal, hgb 10.5, K 3.8, creatinine 1.34 Labs (6/18): BNP 415, K 4.9, creatinine 1.45 Labs (1/19): K 4.4, creatinine 1.56, hgb 11.3 Labs (7/19): K 4, creatinine 1.5, BNP 381 Labs (1/20): K 3.8, creatinine 1.38 Labs  (11/20): K 3.8, creatinine 1.59 Labs (5/21): K 3.8, creatinine 1.4, myeloma panel negative, urine immunofixation negative Labs (8/21): BNP 319, hgb 10.2, K 4.4, creatinine 1.7 Labs (5/22): LDL 87, TSH normal, hgb 12.6, K 3.9, creatinine 1.26 Labs (11/22): LDL 72 Labs (12/22): K 3.7, creatinine 1.59 Labs (1/23): K 3.7, creatinine 1.30 Labs (3/23): K 3.6, creatinine 1.33 Labs (4/23): K 4, creatinine 1.31, LDL 76, HDL 45 Labs (6/23): K 3.3, creatinine 1.46, BNP 390 Labs (7/23): K 4.2, creaitnine 1.7 Labs (9/23): K 3.9, creatinine 1.4 Labs (11/23): BNP 350 Labs (12/23): K 3.7, creatinine 1.44 Labs (4/24): K 5.1, creatinine 1.92 => 2.25 Labs (5/24): BNP 383 Labs (7/24): K 3.9, creatinine 1.35   PMH: 1. Cardiolite (9/16) with EF 54%, no ischemia/infarction. 2. Chronic diastolic CHF: Echo (11/16) with EF 55-60%, normal RV size and systolic function, PA systolic pressure 66 mmHg.  - Echo (7/17) with EF 60-65%, mildly dilated RV with mildly decreased systolic function, PA systolic pressure 36 mmHg.  -  Echo (7/18) with EF 55-60%, mild to moderate AI, normal RV size and systolic function, PASP 43 mmHg.  - Echo (1/20) with EF 55-60%, normal RV size and systolic function, mild AI, mild MR.  - Echo (5/21) witih EF 60-65%, mild LVH, PASP 33 mmHg, normal RV, mild MR, mild-moderate AS with mean gradient 14 mmHg, AVA 1.23 cm^2.  - PYP scan (5/21): grade 0, H/CL 1.1 (not suggestive of TTR amyloidosis).  - Echo (5/22): EF 55-60%, mildly decreased RV systolic function, PASP 47, mild MR, mild AS mean gradient 10 mmHg. - Echo (4/23): EF 55-60%, mild LVH, mild RV dilation with decreased systolic function, mild AS with mean gradient 15 mmHg, PASP 33 mmHg. - Echo (8/23): EF 55-60%, mild LVH, normal RV, PASP 42 mmHg, dilated IVC - Echo (3/24): EF 65-70%, severe diastolic dysfunction, RV okay, mild to moderate MR, mild TR, RVSP at least 67 mmHg, IVC dilated with estimated RAP at least 15 mmHg - Echo (11/24): EF  60-65%, mild aortic stenosis with mean gradient 10 mmHg, mildly decreased RV systolic function, mild MR, PASP 50 mmHg.  3. Chronic atrial fibrillation: She has been intolerant of Multaq and propafenone.  Sotalol and flecainide have not been used due to CKD.   4. SBO: Managed conservatively. 5. CKD stage 3 6. OA: h/o THR and TKR.  7. H/o hyponatremia 8. Pneumonia with parapneumonic effusion with thoracenteses and eventual VATS and decortication.  9. Pulmonary hypertension: Suspect Group 1 pulmonary hypertension.  RHC (12/16) with mean RA 5, PA 62/22 mean 40, mean PCWP 19, CI 2.46, PVR 5 WU.  PFTs (12/16) wiith FVC 71%, FEV1 73%, ratio 104%, TLC 71%, DLCO 53% => moderate restriction concerning for interstitial lung disease.  High resolution CT (12/16) did not show evidence for interstitial lung disease.  V/Q scan (12/16) did not show evidence for chronic PE.  Serologies: RF negative, scleroderma antibodies negative, ANA positive but only 1:80 titer, SSA/B negative, dsDNA negative, CCP negative.  She did not tolerate macitentan or ambrisentan.  Echo 7/17 with PA systolic pressure down to 36 mmHg.  Echo in 11/24 with PASP 50 mmHg, mild RV dysfunction.  10. Lightheadedness/syncope: Extensive workup.  Event and holter monitors as well as telemetry monitoring negative.  She has not been orthostatic.  - Did not tolerate pyridostigmine due to hot flashes.  11. Sciatica: Status post back surgery 1/18.  12. COVID-19 infection 12/20.  13. Peripheral neuropathy 14. Aortic stenosis: Mild to moderate on 5/21 echo.  - Mild on 4/23 echo.  - Mild on 11/24 echo.  15. Breast cancer: Triple negative on left, s/p mastectomy in 7/21.  16. CVA: 11/22, occurred while off anticoagulation.  - Carotid dopplers (11/22) showed 1-39% BICA stenosis.  17. Traumatic subdural hematoma: 4/24.  18. Autonomic neuropathy with orthostatic hypotension   Current Outpatient Medications  Medication Sig Dispense Refill   amoxicillin  (AMOXIL) 500 MG capsule As needed before dental procedures     sildenafil (REVATIO) 20 MG tablet Take 1 tablet (20 mg total) by mouth 3 (three) times daily. 270 tablet 3   apixaban (ELIQUIS) 2.5 MG TABS tablet Take 1 tablet (2.5 mg total) by mouth 2 (two) times daily. 60 tablet 5   midodrine (PROAMATINE) 10 MG tablet Take 1 tablet (10 mg total) by mouth as needed. 30 tablet 3   potassium chloride SA (KLOR-CON M20) 20 MEQ tablet Take 2 tablets (40 mEq total) by mouth 2 (two) times daily. 120 tablet 3   Selexipag (UPTRAVI) 1000 MCG  TABS Take 1,000 mcg by mouth in the morning and at bedtime. 90 tablet 3   torsemide (DEMADEX) 20 MG tablet Take 2 tablets (40 mg total) by mouth 2 (two) times daily. 60 tablet 5   No current facility-administered medications for this encounter.   Allergies:   Acetaminophen, Hydrocodone-acetaminophen, Letairis [ambrisentan], Opsumit [macitentan], Ace inhibitors, Atenolol, Hctz [hydrochlorothiazide], Oxycodone, and Diltiazem hcl   Social History:  The patient  reports that she has never smoked. She has never used smokeless tobacco. She reports that she does not drink alcohol and does not use drugs.   Family History:  The patient's family history includes Cancer in her mother.   ROS:  Please see the history of present illness.   All other systems are personally reviewed and negative.   Recent Labs: 05/27/2022: Magnesium 2.1 11/22/2022: B Natriuretic Peptide 333.7; BUN 22; Creatinine, Ser 1.39; Hemoglobin 13.2; Platelets 212; Potassium 3.5; Sodium 137  Personally reviewed   Wt Readings from Last 3 Encounters:  11/22/22 59.8 kg (131 lb 12.8 oz)  07/30/22 59 kg (130 lb)  05/27/22 57.6 kg (127 lb)   Physical Exam:   BP (!) 160/80   Pulse 74   Wt 59.8 kg (131 lb 12.8 oz)   SpO2 98%   BMI 20.64 kg/m  General: NAD Neck: No JVD, no thyromegaly or thyroid nodule.  Lungs: Clear to auscultation bilaterally with normal respiratory effort. CV: Nondisplaced PMI.  Heart  regular S1/S2, no S3/S4, 2/6 SEM RUSB with clear S2.  No peripheral edema.  No carotid bruit.  Normal pedal pulses.  Abdomen: Soft, nontender, no hepatosplenomegaly, no distention.  Skin: Intact without lesions or rashes.  Neurologic: Alert and oriented x 3.  Psych: Normal affect. Extremities: No clubbing or cyanosis.  HEENT: Normal.   Assessment & Plan: 1. Chronic diastolic CHF:  Echo today showed EF 60-65%, mild aortic stenosis with mean gradient 10 mmHg, mildly decreased RV systolic function, mild MR, PASP 50 mmHg.  NYHA class II-III symptoms, stable.  She is not volume overloaded on exam.  - Continue torsemide 40 mg bid.  BMET/BNP today.   - She is now off spironolactone.   - She did not tolerate Jardiance.   2. Pulmonary hypertension: Moderate to severe pulmonary hypertension by echo (PASP 66 mmHg). RHC showed moderate pulmonary arterial hypertension with PVR 5 WU.  PFTs with restrictive changes suggestive of interstitial process but high resolution CT did not show interstitial lung disease.  No evidence for chronic PE on V/Q scan.  Rheumatologic serologies all negative.  Possible group 1 pulmonary hypertension.  She was unable to tolerate macitentan or ambrisentan.  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  She is now on sildenafil 20 mg tid and selexipag 1000 mcg bid, unable to increase selexipag further due to symptoms.  Echo in 8/23 showed normal RV and mildly elevated PA pressure. Echo during admit at [facility] 03/24 with EF 65-70%, severe diastolic dysfunction, RV okay, mild to moderate MR, mild TR, RVSP at least 67 mmHg, IVC dilated with estimated RAP at least 15 mmHg. Echo today showed PASP 50 mmHg with mild RV dysfunction.  - Continue Uptravi 1000 mg bid.  - Continue sildenafil 20 tid.  This was decreased in the past due to orthostasis.  3. Pulmonary: s/p PNA with parapneumonic effusion and eventual VATS with decortication.  As above, restrictive PFTs but no ILD on high resolution CT.  CT in  4/20 showed a concerning RLL nodule and emphysema, though she never smoked. Her pulmonologist  ordered a PET scan after this per her report, and PET was normal.  4. Atrial fibrillation: Chronic. Off Toprol XL d/t hypotension. HR is controlled.  Xarelto stopped during 4/24 admit with subdural hematoma.  This is a difficult situation as she has had ischemic stroke off anticoagulation in the past. Would not be a good candidate for Watchman Device.  At last neurosurgery visit, she was cleared to restart ASA 81 or possibly DOAC if we felt that fall risk was controlled.  She has had no recent falls and orthostatic symptoms are improved with prn midodrine.  I do think that her stroke risk remains greater than her risk from falls at this time.   - Stop ASA, start apixaban 2.5 mg bid (lower dose given age > 80 and weight < 60 kg).  5. Orthostatic hypotension: She has a long history of this.  Doing better now on midodrine prn.  - Continue compression stockings.  - She did not tolerate pyridostigmine in the past due to hot flashes.  She says she would be willing to try it again if needed.  - Continue midodrine 10 mg prn.  This seems to be working reasonably well.  6. CKD: Stage 3b.  Creatinine 1.35 in 7/24.  - BMET today.  7. Aortic stenosis: Mild on 11/24 echo.  8. CVA: She had a stroke in 11/22 while off Xarelto.  Carotid dopplers were unremarkable.  Her stroke was atrial fibrillation-related. This year had subdural hematoma and hip fracture after separate mechanical falls. Did not require neurosurgical intervention. Xarelto was discontinued.  - As above, she is restarting anticoagulation with Eliquis 2.5 mg bid.   Followup in 4 months.   [name] 11/23/2022  [facility] [address] [facility] [place] [address] [phone] (office) [phone] (fax)

## 2022-11-29 ENCOUNTER — Other Ambulatory Visit (HOSPITAL_COMMUNITY): Payer: Self-pay | Admitting: Cardiology

## 2022-11-29 MED ORDER — POTASSIUM CHLORIDE CRYS ER 10 MEQ PO TBCR: 40.0000 meq | EXTENDED_RELEASE_TABLET | Freq: Two times a day (BID) | ORAL | 3 refills | Status: DC

## 2022-11-29 MED ORDER — APIXABAN 2.5 MG PO TABS: 2.5000 mg | ORAL_TABLET | Freq: Two times a day (BID) | ORAL | 3 refills | Status: DC

## 2023-01-19 ENCOUNTER — Other Ambulatory Visit (HOSPITAL_COMMUNITY): Payer: Self-pay | Admitting: Cardiology

## 2023-01-19 MED ORDER — TORSEMIDE 20 MG PO TABS: 40.0000 mg | ORAL_TABLET | Freq: Two times a day (BID) | ORAL | 3 refills | Status: DC

## 2023-02-07 ENCOUNTER — Other Ambulatory Visit (HOSPITAL_COMMUNITY): Payer: Self-pay

## 2023-02-07 DIAGNOSIS — I272 Pulmonary hypertension, unspecified: Secondary | ICD-10-CM

## 2023-02-07 MED ORDER — SILDENAFIL CITRATE 20 MG PO TABS: 20.0000 mg | ORAL_TABLET | Freq: Three times a day (TID) | ORAL | 3 refills | Status: AC

## 2023-02-19 ENCOUNTER — Other Ambulatory Visit (HOSPITAL_COMMUNITY): Payer: Self-pay | Admitting: Internal Medicine

## 2023-03-02 ENCOUNTER — Other Ambulatory Visit (HOSPITAL_COMMUNITY): Payer: Self-pay | Admitting: Cardiology

## 2023-03-02 MED ORDER — TORSEMIDE 20 MG PO TABS: 40.0000 mg | ORAL_TABLET | Freq: Two times a day (BID) | ORAL | 3 refills | Status: DC

## 2023-03-22 ENCOUNTER — Encounter (HOSPITAL_COMMUNITY): Payer: Medicare Other

## 2023-04-08 NOTE — Progress Notes (Signed)
 [facility] Note  Date:  04/08/2023  ID:  Rachael Myers, DOB 1929/02/27, MRN [id number]  Type of Visit: Established patient PCP:  Dan Maker., MD  HF Cardiologist:  Dr. Shirlee Latch  Chief complaint: Heart failure follow up   HPI: Rachael Myers is a 89 y.o. female who has a history of chronic diastolic CHF, chronic atrial fibrillation (failed multaq and propafenone), and prominent exertional dyspnea.  In 1/16, she developed PNA.  She had a parapneumonic effusion that required 4 thoracenteses.  Eventually, she had VATS with decortication and a wedge resection.  Cytology was negative from the pleural fluid.  Echo 11/16: normal LV EF and apparently normal RV EF.  PA systolic pressure estimation was moderate to severely increased.  RHC 12/16: moderate pulmonary arterial hypertension.  PFTs showed a restrictive pattern concerning for interstitial lung disease.  V/Q scan in 12/16 was not suggestive of chronic PE.  Despite abnormal PFTs, high resolution CT did not show interstitial lung disease.     She developed a cough and mouth soreness with Opsumit and had to stop it after about 7 days.  Symptoms resolved when she stopped it.  Tried to get Adcirca for her.  This was denied by her insurance company, so she was started on Revatio 20 mg tid instead.  She thinks that this has helped her breathing "a little."  She saw a pulmonary specialist who thought dyspnea was likely due to [facility].  Next, tried her on ambrisentan, but she developed facial swelling on ambrisentan (significant around the eyes).  She also developed worsening dyspnea.  She stopped ambrisentan and the swelling resolved but she was noted to be volume overloaded.  Switched her Lasix to torsemide.  She was started on selexipag which she has tolerated.    She had an episode of PNA in 2/20.  CT C/A/P 4/20 showed RLL nodule concerning for malignancy and pancreatic cysts, likely indolent cystic pancreatic neoplasm. There was  moderate emphysema though she never smoked.  She says that her pulmonologist in [facility] followed up on her CT with a PET scan that was normal.   Echo 5/2:  EF 60-65%, mild LVH, PASP 33 mmHg, normal RV, mild MR, mild-moderate AS with mean gradient 14 mmHg, AVA 1.23 cm^2.  PYP scan in 5/21 was not suggestive of TTR amyloidosis.   [name] and had mastectomy (no chemo or radiation).   Echo 5/22: EF 55-60%, mildly decreased RV systolic function, PASP 47, mild MR, mild AS mean gradient 10 mmHg.   She had a fall in 11/22 at home, seems to have been mechanical.  She developed a scalp hematoma and bruising on her arm.  Xarelto was held.  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  Xarelto was restarted.  She was sent to rehab for 3 wks and was not given her torsemide.    Patient was admitted twice at the hospital in [facility] in 4/23, once with diastolic CHF and once with ?TIA.  Workup was negative for recurrent CVA.  Echo in 4/23 showed EF 55-60%, mild LVH, mild RV dilation with decreased systolic function, mild AS with mean gradient 15 mmHg, PASP 33 mmHg.  She stopped Jardiance, does not feel like she could tolerate it.   Echo 8/23: EF 55-60%, mild LVH, normal RV, PASP 42 mmHg, dilated IVC.   She was admitted to [facility] 02/21-02/26 with left femoral neck fracture after a fall and  undewrent ORIF with cannulated screws. Course c/b AKI. She was given IV fluids and Torsemide stopped. She was discharged to rehab. Torsemide later restarted and eventually required addition of IV lasix. Underwent right thoracentesis as well. Once discharge from rehab she was readmitted to [facility] for additional diuresis. She was discharged on 80 mg furosemide daily and 50 mg spironolactone daily. Discharge weight 138 lb. Echo during admit with EF 65-70%, severe diastolic dysfunction, RV okay, mild to moderate MR, mild TR, RVSP at least 67 mmHg, IVC dilated with estimated RAP at  least 15 mmHg  The patient was admitted to [facility] with SDH on 04/20/22 after a fall at home.  Had been falling at home as well as episodes of hypotension. Neurosurgery consulted and no surgical intervention recommended. Diuretics and pulmonary hypertension meds initially held. At discharge she was sent home on sildenafil 20 mg TID (hypotension on higher doses), lasix 20 mg BID and uptravi 400 mcg BID.  Metoprolol discontinued and Xarelto stopped as well.  Echo 11/24 EF 60-65%, mild aortic stenosis with mean gradient 10 mmHg, mildly decreased RV systolic function, mild MR, PASP 50 mmHg.   Today she returns for AHF follow up. Overall feeling ***. Denies palpitations, CP, dizziness, edema, or PND/Orthopnea. *** SOB. Appetite ok. No fever or chills. Weight at home *** pounds. Taking all medications, take midodrine PRN.  ECG (personally reviewed from 11/24): atrial fibrillation, LAFB, LVH, iRBBB   Pertinent labs:  Labs (12/16);  normal rheumatoid factor and scleroderma serologies Labs (1/17): ANA positive but only 1:80 titer, dsDNA negative, SSA/B negative, CCP negative.  Labs (5/21): myeloma panel negative, urine immunofixation negative   PMH: 1. Cardiolite (9/16) with EF 54%, no ischemia/infarction. 2. Chronic diastolic CHF: Echo (11/16) with EF 55-60%, normal RV size and systolic function, PA systolic pressure 66 mmHg.  - Echo (7/17) with EF 60-65%, mildly dilated RV with mildly decreased systolic function, PA systolic pressure 36 mmHg.  - Echo (7/18) with EF 55-60%, mild to moderate AI, normal RV size and systolic function, PASP 43 mmHg.  - Echo (1/20) with EF 55-60%, normal RV size and systolic function, mild AI, mild MR.  - Echo (5/21) witih EF 60-65%, mild LVH, PASP 33 mmHg, normal RV, mild MR, mild-moderate AS with mean gradient 14 mmHg, AVA 1.23 cm^2.  - PYP scan (5/21): grade 0, H/CL 1.1 (not suggestive of TTR amyloidosis).  - Echo (5/22): EF 55-60%, mildly decreased RV systolic function,  PASP 47, mild MR, mild AS mean gradient 10 mmHg. - Echo (4/23): EF 55-60%, mild LVH, mild RV dilation with decreased systolic function, mild AS with mean gradient 15 mmHg, PASP 33 mmHg. - Echo (8/23): EF 55-60%, mild LVH, normal RV, PASP 42 mmHg, dilated IVC - Echo (3/24): EF 65-70%, severe diastolic dysfunction, RV okay, mild to moderate MR, mild TR, RVSP at least 67 mmHg, IVC dilated with estimated RAP at least 15 mmHg - Echo (11/24): EF 60-65%, mild aortic stenosis with mean gradient 10 mmHg, mildly decreased RV systolic function, mild MR, PASP 50 mmHg.  3. Chronic atrial fibrillation: She has been intolerant of Multaq and propafenone.  Sotalol and flecainide have not been used due to CKD.   4. SBO: Managed conservatively. 5. CKD stage 3 6. OA: h/o THR and TKR.  7. H/o hyponatremia 8. Pneumonia with parapneumonic effusion with thoracenteses and eventual VATS and decortication.  9. Pulmonary hypertension: Suspect Group 1 pulmonary hypertension.  RHC (12/16) with mean RA 5, PA 62/22 mean 40, mean PCWP 19,  CI 2.46, PVR 5 WU.  PFTs (12/16) wiith FVC 71%, FEV1 73%, ratio 104%, TLC 71%, DLCO 53% => moderate restriction concerning for interstitial lung disease.  High resolution CT (12/16) did not show evidence for interstitial lung disease.  V/Q scan (12/16) did not show evidence for chronic PE.  Serologies: RF negative, scleroderma antibodies negative, ANA positive but only 1:80 titer, SSA/B negative, dsDNA negative, CCP negative.  She did not tolerate macitentan or ambrisentan.  Echo 7/17 with PA systolic pressure down to 36 mmHg.  Echo in 11/24 with PASP 50 mmHg, mild RV dysfunction.  10. Lightheadedness/syncope: Extensive workup.  Event and holter monitors as well as telemetry monitoring negative.  She has not been orthostatic.  - Did not tolerate pyridostigmine due to hot flashes.  11. Sciatica: Status post back surgery 1/18.  12. COVID-19 infection 12/20.  13. Peripheral neuropathy 14. Aortic  stenosis: Mild to moderate on 5/21 echo.  - Mild on 4/23 echo.  - Mild on 11/24 echo.  15. Breast cancer: Triple negative on left, s/p mastectomy in 7/21.  16. CVA: 11/22, occurred while off anticoagulation.  - Carotid dopplers (11/22) showed 1-39% BICA stenosis.  17. Traumatic subdural hematoma: 4/24.  18. Autonomic neuropathy with orthostatic hypotension   Current Outpatient Medications  Medication Sig Dispense Refill   amoxicillin (AMOXIL) 500 MG capsule As needed before dental procedures     apixaban (ELIQUIS) 2.5 MG TABS tablet Take 1 tablet (2.5 mg total) by mouth 2 (two) times daily. 180 tablet 3   midodrine (PROAMATINE) 10 MG tablet Take 1 tablet (10 mg total) by mouth as needed. 30 tablet 3   potassium chloride SA (KLOR-CON M) 10 MEQ tablet Take 4 tablets (40 mEq total) by mouth 2 (two) times daily. 240 tablet 3   Selexipag (UPTRAVI) 1000 MCG TABS Take 1,000 mcg by mouth in the morning and at bedtime. 90 tablet 3   sildenafil (REVATIO) 20 MG tablet Take 1 tablet (20 mg total) by mouth 3 (three) times daily. 270 tablet 3   torsemide (DEMADEX) 20 MG tablet Take 2 tablets (40 mg total) by mouth 2 (two) times daily. 360 tablet 3   No current facility-administered medications for this visit.   Allergies:   Acetaminophen, Hydrocodone-acetaminophen, Letairis [ambrisentan], Opsumit [macitentan], Ace inhibitors, Atenolol, Hctz [hydrochlorothiazide], Oxycodone, and Diltiazem hcl   Social History:  The patient  reports that she has never smoked. She has never used smokeless tobacco. She reports that she does not drink alcohol and does not use drugs.   Family History:  The patient's family history includes Cancer in her mother.   ROS:  Please see the history of present illness.   All other systems are personally reviewed and negative.   Recent Labs: 05/27/2022: Magnesium 2.1 11/22/2022: B Natriuretic Peptide 333.7; BUN 22; Creatinine, Ser 1.39; Hemoglobin 13.2; Platelets 212; Potassium  3.5; Sodium 137    Wt Readings from Last 3 Encounters:  11/22/22 59.8 kg (131 lb 12.8 oz)  07/30/22 59 kg (130 lb)  05/27/22 57.6 kg (127 lb)   Physical Exam:   There were no vitals taken for this visit. General:  *** appearing.  No respiratory difficulty HEENT: normal Neck: supple. JVD *** cm.  Cor: PMI nondisplaced. Regular rate & rhythm. No rubs, gallops or murmurs. Lungs: clear Abdomen: soft, nontender, nondistended. Good bowel sounds. Extremities: no cyanosis, clubbing, rash, edema  Neuro: alert & oriented x 3. Moves all 4 extremities w/o difficulty. Affect pleasant.   Assessment & Plan: 1.  Chronic diastolic CHF:  Echo today showed EF 60-65%, mild aortic stenosis with mean gradient 10 mmHg, mildly decreased RV systolic function, mild MR, PASP 50 mmHg.  NYHA class II-III symptoms, stable.  She is not volume overloaded on exam.  - Continue torsemide 40 mg bid.  BMET/BNP today.   - She is now off spironolactone.   - She did not tolerate Jardiance.    2. Pulmonary hypertension: Moderate to severe pulmonary hypertension by echo (PASP 66 mmHg). RHC showed moderate pulmonary arterial hypertension with PVR 5 WU.  PFTs with restrictive changes suggestive of interstitial process but high resolution CT did not show interstitial lung disease.  No evidence for chronic PE on V/Q scan.  Rheumatologic serologies all negative.  Possible group 1 pulmonary hypertension.  She was unable to tolerate macitentan or ambrisentan.  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  She is now on sildenafil 20 mg tid and selexipag 1000 mcg bid, unable to increase selexipag further due to symptoms.  Echo in 8/23 showed normal RV and mildly elevated PA pressure. Echo during admit at [facility] 03/24 with EF 65-70%, severe diastolic dysfunction, RV okay, mild to moderate MR, mild TR, RVSP at least 67 mmHg, IVC dilated with estimated RAP at least 15 mmHg. Echo today showed PASP 50 mmHg with mild RV dysfunction.  - Continue Uptravi 1000  mg bid.  - Continue sildenafil 20 tid.  This was decreased in the past due to orthostasis.   3. Pulmonary: s/p PNA with parapneumonic effusion and eventual VATS with decortication.  As above, restrictive PFTs but no ILD on high resolution CT.  CT in 4/20 showed a concerning RLL nodule and emphysema, though she never smoked. Her pulmonologist ordered a PET scan after this per her report, and PET was normal.   4. Atrial fibrillation: Chronic. Off Toprol XL d/t hypotension. HR is controlled.  Xarelto stopped during 4/24 admit with subdural hematoma.  This is a difficult situation as she has had ischemic stroke off anticoagulation in the past. Would not be a good candidate for Watchman Device.  At last neurosurgery visit, she was cleared to restart ASA 81 or possibly DOAC if we felt that fall risk was controlled.  She has had no recent falls and orthostatic symptoms are improved with prn midodrine.  I do think that her stroke risk remains greater than her risk from falls at this time.   - Continue apixaban 2.5 mg bid (lower dose given age > 80 and weight < 60 kg).   5. Orthostatic hypotension: She has a long history of this.  Doing better now on midodrine prn.  - Continue compression stockings.  - She did not tolerate pyridostigmine in the past due to hot flashes.  She says she would be willing to try it again if needed.  - Continue midodrine 10 mg prn.  This seems to be working reasonably well.   6. CKD: Stage 3b.  Creatinine 1.35 in 7/24.  - BMET today.   7. Aortic stenosis: Mild on 11/24 echo.   8. CVA: She had a stroke in 11/22 while off Xarelto.  Carotid dopplers were unremarkable.  Her stroke was atrial fibrillation-related. This year had subdural hematoma and hip fracture after separate mechanical falls. Did not require neurosurgical intervention. Xarelto was discontinued.  - Continue Eliquis 2.5 mg bid.   Followup in 4 months. ***  Alen Bleacher 04/08/2023  [facility] [address] [facility] [place] [place] [address] (  [phone] (office) [phone] (fax)

## 2023-04-14 ENCOUNTER — Encounter (HOSPITAL_COMMUNITY): Payer: Self-pay

## 2023-04-14 ENCOUNTER — Ambulatory Visit (HOSPITAL_COMMUNITY)
Admission: RE | Admit: 2023-04-14 | Discharge: 2023-04-14 | Disposition: A | Discharge status: Home / Self Care | Source: Ambulatory Visit | Attending: Internal Medicine | Admitting: Internal Medicine

## 2023-04-14 VITALS — BP 146/88 | HR 66 | Wt 133.0 lb

## 2023-04-14 DIAGNOSIS — Z7901 Long term (current) use of anticoagulants: Secondary | ICD-10-CM | POA: Insufficient documentation

## 2023-04-14 DIAGNOSIS — I350 Nonrheumatic aortic (valve) stenosis: Secondary | ICD-10-CM | POA: Insufficient documentation

## 2023-04-14 DIAGNOSIS — I5032 Chronic diastolic (congestive) heart failure: Secondary | ICD-10-CM | POA: Insufficient documentation

## 2023-04-14 DIAGNOSIS — I482 Chronic atrial fibrillation, unspecified: Secondary | ICD-10-CM | POA: Insufficient documentation

## 2023-04-14 DIAGNOSIS — N1832 Chronic kidney disease, stage 3b: Secondary | ICD-10-CM | POA: Insufficient documentation

## 2023-04-14 DIAGNOSIS — Z79899 Other long term (current) drug therapy: Secondary | ICD-10-CM | POA: Diagnosis not present

## 2023-04-14 DIAGNOSIS — I272 Pulmonary hypertension, unspecified: Secondary | ICD-10-CM | POA: Insufficient documentation

## 2023-04-14 DIAGNOSIS — Z8673 Personal history of transient ischemic attack (TIA), and cerebral infarction without residual deficits: Secondary | ICD-10-CM | POA: Diagnosis not present

## 2023-04-14 DIAGNOSIS — I951 Orthostatic hypotension: Secondary | ICD-10-CM | POA: Insufficient documentation

## 2023-04-14 DIAGNOSIS — I639 Cerebral infarction, unspecified: Secondary | ICD-10-CM

## 2023-04-14 NOTE — Patient Instructions (Addendum)
 Thank you for coming in today  If you had labs drawn today, any labs that are abnormal the clinic will call you No news is good news  Medications: No changes  Follow up appointments:  Your physician recommends that you schedule a follow-up appointment in:  4 months With Dr. Shirlee Latch  Please call our office to schedule the follow-up appointment in July 2025.   Do the following things EVERYDAY: Weigh yourself in the morning before breakfast. Write it down and keep it in a log. Take your medicines as prescribed Eat low salt foods--Limit salt (sodium) to 2000 mg per day.  Stay as active as you can everyday Limit all fluids for the day to less than 2 liters   At the [facility], you and your health needs are our priority. As part of our continuing mission to provide you with exceptional heart care, we have created designated Provider Care Teams. These Care Teams include your primary Cardiologist (physician) and Advanced Practice Providers (APPs- Physician Assistants and Nurse Practitioners) who all work together to provide you with the care you need, when you need it.   You may see any of the following providers on your designated Care Team at your next follow up: Dr Arvilla Meres Dr [name] Dr. Marcos Eke, NP Robbie Lis, [place] [facility] [place], [place] Brynda Peon, NP Karle Plumber, PharmD   Please be sure to bring in all your medications bottles to every appointment.    Thank you for choosing [facility]  If you have any questions or concerns before your next appointment please send [place] a message through [place] or call our office at [phone].    TO LEAVE A MESSAGE FOR THE NURSE SELECT OPTION 2, PLEASE LEAVE A MESSAGE INCLUDING: YOUR NAME DATE OF BIRTH CALL BACK NUMBER REASON FOR CALL**this is important as we prioritize the call backs  YOU WILL RECEIVE A CALL BACK THE SAME DAY  AS LONG AS YOU CALL BEFORE 4:00 PM

## 2023-04-25 ENCOUNTER — Other Ambulatory Visit (HOSPITAL_COMMUNITY): Payer: Self-pay | Admitting: Cardiology

## 2023-04-25 MED ORDER — POTASSIUM CHLORIDE CRYS ER 10 MEQ PO TBCR: 40.0000 meq | EXTENDED_RELEASE_TABLET | Freq: Two times a day (BID) | ORAL | 3 refills | Status: DC

## 2023-06-23 ENCOUNTER — Ambulatory Visit (HOSPITAL_COMMUNITY)
Admission: RE | Admit: 2023-06-23 | Discharge: 2023-06-23 | Disposition: A | Discharge status: Home / Self Care | Source: Ambulatory Visit | Attending: Adult Health | Admitting: Adult Health

## 2023-06-23 ENCOUNTER — Ambulatory Visit (HOSPITAL_COMMUNITY): Payer: Self-pay | Admitting: Adult Health

## 2023-06-23 ENCOUNTER — Encounter (HOSPITAL_COMMUNITY): Payer: Self-pay

## 2023-06-23 VITALS — BP 180/90 | HR 79 | Wt 137.0 lb

## 2023-06-23 DIAGNOSIS — N1832 Chronic kidney disease, stage 3b: Secondary | ICD-10-CM | POA: Diagnosis not present

## 2023-06-23 DIAGNOSIS — I5023 Acute on chronic systolic (congestive) heart failure: Secondary | ICD-10-CM | POA: Diagnosis not present

## 2023-06-23 DIAGNOSIS — I5032 Chronic diastolic (congestive) heart failure: Secondary | ICD-10-CM | POA: Insufficient documentation

## 2023-06-23 DIAGNOSIS — I2721 Secondary pulmonary arterial hypertension: Secondary | ICD-10-CM | POA: Diagnosis not present

## 2023-06-23 DIAGNOSIS — I350 Nonrheumatic aortic (valve) stenosis: Secondary | ICD-10-CM | POA: Diagnosis not present

## 2023-06-23 DIAGNOSIS — Z7901 Long term (current) use of anticoagulants: Secondary | ICD-10-CM | POA: Diagnosis not present

## 2023-06-23 DIAGNOSIS — I272 Pulmonary hypertension, unspecified: Secondary | ICD-10-CM | POA: Insufficient documentation

## 2023-06-23 DIAGNOSIS — I351 Nonrheumatic aortic (valve) insufficiency: Secondary | ICD-10-CM | POA: Insufficient documentation

## 2023-06-23 DIAGNOSIS — I482 Chronic atrial fibrillation, unspecified: Secondary | ICD-10-CM

## 2023-06-23 DIAGNOSIS — Z79899 Other long term (current) drug therapy: Secondary | ICD-10-CM | POA: Insufficient documentation

## 2023-06-23 DIAGNOSIS — J439 Emphysema, unspecified: Secondary | ICD-10-CM | POA: Diagnosis not present

## 2023-06-23 DIAGNOSIS — R42 Dizziness and giddiness: Secondary | ICD-10-CM | POA: Diagnosis not present

## 2023-06-23 DIAGNOSIS — I517 Cardiomegaly: Secondary | ICD-10-CM | POA: Diagnosis not present

## 2023-06-23 DIAGNOSIS — Z8673 Personal history of transient ischemic attack (TIA), and cerebral infarction without residual deficits: Secondary | ICD-10-CM | POA: Diagnosis not present

## 2023-06-23 DIAGNOSIS — I951 Orthostatic hypotension: Secondary | ICD-10-CM | POA: Insufficient documentation

## 2023-06-23 NOTE — Patient Instructions (Signed)
 Great to see you today!!!  Medication Changes:  Take extra 20 mg of Torsemide  tomorrow (Fri 6/20)  Testing/Procedures:  Your physician has requested that you have an echocardiogram. Echocardiography is a painless test that uses sound waves to create images of your heart. It provides your doctor with information about the size and shape of your heart and how well your heart's chambers and valves are working. This procedure takes approximately one hour. There are no restrictions for this procedure. Please do NOT wear cologne, perfume, aftershave, or lotions (deodorant is allowed). Please arrive 15 minutes prior to your appointment time.  Please note: We ask at that you not bring children with you during ultrasound (echo/ vascular) testing. Due to room size and safety concerns, children are not allowed in the ultrasound rooms during exams. Our front office staff cannot provide observation of children in our lobby area while testing is being conducted. An adult accompanying a patient to their appointment will only be allowed in the ultrasound room at the discretion of the ultrasound technician under special circumstances. We apologize for any inconvenience.  Special Instructions // Education:  Do the following things EVERYDAY: Weigh yourself in the morning before breakfast. Write it down and keep it in a log. Take your medicines as prescribed Eat low salt foods--Limit salt (sodium) to 2000 mg per day.  Stay as active as you can everyday Limit all fluids for the day to less than 2 liters   Follow-Up in: 1-2 months with an echocardiogram   At the [facility], you and your health needs are our priority. We have a designated team specialized in the treatment of Heart Failure. This Care Team includes your primary Heart Failure Specialized Cardiologist (physician), Advanced Practice Providers (APPs- Physician Assistants and Nurse Practitioners), and Pharmacist who all work together  to provide you with the care you need, when you need it.   You may see any of the following providers on your designated Care Team at your next follow up:  Dr. Jules Oar Dr. Peder Bourdon Dr. Alwin Baars Dr. Judyth Nunnery Nieves Bars, NP Ruddy Corral, [place] [facility] [place], [place] Dennise Fitz, NP [place] Lee, NP Luster Salters, PharmD   Please be sure to bring in all your medications bottles to every appointment.   Need to Contact Us :  If you have any questions or concerns before your next appointment please send us  a message through [place] or call our office at [phone].    TO LEAVE A MESSAGE FOR THE NURSE SELECT OPTION 2, PLEASE LEAVE A MESSAGE INCLUDING: YOUR NAME DATE OF BIRTH CALL BACK NUMBER REASON FOR CALL**this is important as we prioritize the call backs  YOU WILL RECEIVE A CALL BACK THE SAME DAY AS LONG AS YOU CALL BEFORE 4:00 PM

## 2023-06-23 NOTE — Progress Notes (Signed)
 ReDS Vest / Clip - 06/23/23 1500       ReDS Vest / Clip   Station Marker C    Ruler Value 26    ReDS Value Range Moderate volume overload    ReDS Actual Value 37

## 2023-06-23 NOTE — Progress Notes (Signed)
 [facility] Note  Date:  06/23/2023  ID:  Rachael Myers, DOB 05-05-1929, MRN [id number]  Type of Visit: Established patient PCP:  Alwyn Juba., MD  HF Cardiologist:  Dr. Mitzie Anda  Chief complaint: Heart Failure    HPI: Rachael Myers is a 89 y.o. female who has a history of chronic diastolic CHF, chronic atrial fibrillation (failed multaq and propafenone), and prominent exertional dyspnea.  In 1/16, she developed PNA.  She had a parapneumonic effusion that required 4 thoracenteses.  Eventually, she had VATS with decortication and a wedge resection.  Cytology was negative from the pleural fluid.  Echo 11/16: normal LV EF and apparently normal RV EF.  PA systolic pressure estimation was moderate to severely increased.  RHC 12/16: moderate pulmonary arterial hypertension.  PFTs showed a restrictive pattern concerning for interstitial lung disease.  V/Q scan in 12/16 was not suggestive of chronic PE.  Despite abnormal PFTs, high resolution CT did not show interstitial lung disease.     She developed a cough and mouth soreness with Opsumit and had to stop it after about 7 days.  Symptoms resolved when she stopped it.  Tried to get Adcirca for her.  This was denied by her insurance company, so she was started on Revatio  20 mg tid instead.  She thinks that this has helped her breathing a little.  She saw a pulmonary specialist who thought dyspnea was likely due to [facility].  Next, tried her on ambrisentan , but she developed facial swelling on ambrisentan  (significant around the eyes).  She also developed worsening dyspnea.  She stopped ambrisentan  and the swelling resolved but she was noted to be volume overloaded.  Switched her Lasix  to torsemide .  She was started on selexipag  which she has tolerated.    She had an episode of PNA in 2/20.  CT C/A/P 4/20 showed RLL nodule concerning for malignancy and pancreatic cysts, likely indolent cystic pancreatic neoplasm. There was moderate  emphysema though she never smoked.  She says that her pulmonologist in [facility] followed up on her CT with a PET scan that was normal.   Echo 5/2:  EF 60-65%, mild LVH, PASP 33 mmHg, normal RV, mild MR, mild-moderate AS with mean gradient 14 mmHg, AVA 1.23 cm^2.  PYP scan in 5/21 was not suggestive of TTR amyloidosis.   [name] and had mastectomy (no chemo or radiation).   Echo 5/22: EF 55-60%, mildly decreased RV systolic function, PASP 47, mild MR, mild AS mean gradient 10 mmHg.   She had a fall in 11/22 at home, seems to have been mechanical.  She developed a scalp hematoma and bruising on her arm.  Xarelto  was held.  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  Xarelto  was restarted.  She was sent to rehab for 3 wks and was not given her torsemide .    Patient was admitted twice at the hospital in [facility] in 4/23, once with diastolic CHF and once with ?TIA.  Workup was negative for recurrent CVA.  Echo in 4/23 showed EF 55-60%, mild LVH, mild RV dilation with decreased systolic function, mild AS with mean gradient 15 mmHg, PASP 33 mmHg.  She stopped Jardiance , does not feel like she could tolerate it.   Echo 8/23: EF 55-60%, mild LVH, normal RV, PASP 42 mmHg, dilated IVC.   She was admitted to [facility] 02/21-02/26 with left femoral neck fracture after a fall and undewrent  ORIF with cannulated screws. Course c/b AKI. She was given IV fluids and Torsemide  stopped. She was discharged to rehab. Torsemide  later restarted and eventually required addition of IV lasix . Underwent right thoracentesis as well. Once discharge from rehab she was readmitted to [facility] for additional diuresis. She was discharged on 80 mg furosemide  daily and 50 mg spironolactone daily. Discharge weight 138 lb. Echo during admit with EF 65-70%, severe diastolic dysfunction, RV okay, mild to moderate MR, mild TR, RVSP at least 67 mmHg, IVC dilated with estimated RAP at least 15  mmHg  The patient was admitted to [facility] with SDH on 04/20/22 after a fall at home.  Had been falling at home as well as episodes of hypotension. Neurosurgery consulted and no surgical intervention recommended. Diuretics and pulmonary hypertension meds initially held. At discharge she was sent home on sildenafil  20 mg TID (hypotension on higher doses), lasix  20 mg BID and uptravi  400 mcg BID.  Metoprolol  discontinued and Xarelto  stopped as well.  Echo 11/24 EF 60-65%, mild aortic stenosis with mean gradient 10 mmHg, mildly decreased RV systolic function, mild MR, PASP 50 mmHg.   Today she returns for HF follow up.Overall feeling fine. SBP is variable  180 in the moring and later drips 110 in afternoon. She had presyncope in her bathroom 2 weeks ago. She grabbed the cabinet and it gradually went away. Gets a little short of breath with exertion.  Denies PND/Orthopnea. Appetite ok. No fever or chills. Weight at home has been stable.  Taking all medications. She is taking midodrine  2 times a week. She has a caregiver every night.   PMH: 1. Cardiolite  (9/16) with EF 54%, no ischemia/infarction. 2. Chronic diastolic CHF: Echo (11/16) with EF 55-60%, normal RV size and systolic function, PA systolic pressure 66 mmHg.  - Echo (7/17) with EF 60-65%, mildly dilated RV with mildly decreased systolic function, PA systolic pressure 36 mmHg.  - Echo (7/18) with EF 55-60%, mild to moderate AI, normal RV size and systolic function, PASP 43 mmHg.  - Echo (1/20) with EF 55-60%, normal RV size and systolic function, mild AI, mild MR.  - Echo (5/21) witih EF 60-65%, mild LVH, PASP 33 mmHg, normal RV, mild MR, mild-moderate AS with mean gradient 14 mmHg, AVA 1.23 cm^2.  - PYP scan (5/21): grade 0, H/CL 1.1 (not suggestive of TTR amyloidosis).  - Echo (5/22): EF 55-60%, mildly decreased RV systolic function, PASP 47, mild MR, mild AS mean gradient 10 mmHg. - Echo (4/23): EF 55-60%, mild LVH, mild RV dilation with  decreased systolic function, mild AS with mean gradient 15 mmHg, PASP 33 mmHg. - Echo (8/23): EF 55-60%, mild LVH, normal RV, PASP 42 mmHg, dilated IVC - Echo (3/24): EF 65-70%, severe diastolic dysfunction, RV okay, mild to moderate MR, mild TR, RVSP at least 67 mmHg, IVC dilated with estimated RAP at least 15 mmHg - Echo (11/24): EF 60-65%, mild aortic stenosis with mean gradient 10 mmHg, mildly decreased RV systolic function, mild MR, PASP 50 mmHg.  3. Chronic atrial fibrillation: She has been intolerant of Multaq and propafenone.  Sotalol and flecainide have not been used due to CKD.   4. SBO: Managed conservatively. 5. CKD stage 3 6. OA: h/o THR and TKR.  7. H/o hyponatremia 8. Pneumonia with parapneumonic effusion with thoracenteses and eventual VATS and decortication.  9. Pulmonary hypertension: Suspect Group 1 pulmonary hypertension.  RHC (12/16) with mean RA 5, PA 62/22 mean 40, mean PCWP 19, CI 2.46, PVR  5 WU.  PFTs (12/16) wiith FVC 71%, FEV1 73%, ratio 104%, TLC 71%, DLCO 53% => moderate restriction concerning for interstitial lung disease.  High resolution CT (12/16) did not show evidence for interstitial lung disease.  V/Q scan (12/16) did not show evidence for chronic PE.  Serologies: RF negative, scleroderma antibodies negative, ANA positive but only 1:80 titer, SSA/B negative, dsDNA negative, CCP negative.  She did not tolerate macitentan or ambrisentan .  Echo 7/17 with PA systolic pressure down to 36 mmHg.  Echo in 11/24 with PASP 50 mmHg, mild RV dysfunction.  10. Lightheadedness/syncope: Extensive workup.  Event and holter monitors as well as telemetry monitoring negative.  She has not been orthostatic.  - Did not tolerate pyridostigmine  due to hot flashes.  11. Sciatica: Status post back surgery 1/18.  12. COVID-19 infection 12/20.  13. Peripheral neuropathy 14. Aortic stenosis: Mild to moderate on 5/21 echo.  - Mild on 4/23 echo.  - Mild on 11/24 echo.  15. Breast cancer:  Triple negative on left, s/p mastectomy in 7/21.  16. CVA: 11/22, occurred while off anticoagulation.  - Carotid dopplers (11/22) showed 1-39% BICA stenosis.  17. Traumatic subdural hematoma: 4/24.  18. Autonomic neuropathy with orthostatic hypotension   Current Outpatient Medications  Medication Sig Dispense Refill   amoxicillin (AMOXIL) 500 MG capsule As needed before dental procedures     apixaban  (ELIQUIS ) 2.5 MG TABS tablet Take 1 tablet (2.5 mg total) by mouth 2 (two) times daily. 180 tablet 3   midodrine  (PROAMATINE ) 10 MG tablet Take 1 tablet (10 mg total) by mouth as needed. 30 tablet 3   potassium chloride  (KLOR-CON  M) 10 MEQ tablet Take 4 tablets (40 mEq total) by mouth 2 (two) times daily. 240 tablet 3   Selexipag  (UPTRAVI ) 1000 MCG TABS Take 1,000 mcg by mouth in the morning and at bedtime. 90 tablet 3   sildenafil  (REVATIO ) 20 MG tablet Take 1 tablet (20 mg total) by mouth 3 (three) times daily. 270 tablet 3   torsemide  (DEMADEX ) 20 MG tablet Take 2 tablets (40 mg total) by mouth 2 (two) times daily. 360 tablet 3   No current facility-administered medications for this encounter.   Allergies:   Acetaminophen , Hydrocodone-acetaminophen , Letairis  [ambrisentan ], Opsumit [macitentan], Ace inhibitors, Atenolol, Hctz [hydrochlorothiazide], Oxycodone, and Diltiazem hcl   Social History:  The patient  reports that she has never smoked. She has never used smokeless tobacco. She reports that she does not drink alcohol and does not use drugs.   Family History:  The patient's family history includes Cancer in her mother.   ROS:  Please see the history of present illness.   All other systems are personally reviewed and negative.   Recent Labs: 11/22/2022: B Natriuretic Peptide 333.7; BUN 22; Creatinine, Ser 1.39; Hemoglobin 13.2; Platelets 212; Potassium 3.5; Sodium 137    Wt Readings from Last 3 Encounters:  06/23/23 62.1 kg (137 lb)  04/14/23 60.3 kg (133 lb)  11/22/22 59.8 kg (131  lb 12.8 oz)   Physical Exam:   BP (!) 180/90   Pulse 79   Wt 62.1 kg (137 lb)   SpO2 96%   BMI 21.46 kg/m  General:   No resp difficulty Neck: supple. JVP 8-9   Cor: PMI nondisplaced. Irregular rate & rhythm. No rubs, gallops . RUSB 2/6 murmurs. Lungs: clear Abdomen: soft, nontender, nondistended.  Extremities: no cyanosis, clubbing, rash, edema Neuro: alert & oriented x3  eKG; A fib 71 bpm personally reviewed.   Assessment &  Plan: 1. Chronic diastolic CHF:  Echo 11/24 EF 60-65%, mild aortic stenosis with mean gradient 10 mmHg, mildly decreased RV systolic function, mild MR, PASP 50 mmHg.   NYHA III.  ReDs reading: 37 %, abnormal Mild volume overload.  - Continue torsemide  40 mg bid.   Tomorrow she will take an extra 20 mg of torsemmide.  - She is now off spironolactone.   - She did not tolerate Jardiance .    2. Pulmonary hypertension: Moderate to severe pulmonary hypertension by echo (PASP 66 mmHg). RHC showed moderate pulmonary arterial hypertension with PVR 5 WU.  PFTs with restrictive changes suggestive of interstitial process but high resolution CT did not show interstitial lung disease.  No evidence for chronic PE on V/Q scan.  Rheumatologic serologies all negative.  Possible group 1 pulmonary hypertension.  She was unable to tolerate macitentan or ambrisentan .  Insurance would not approve Adcirca.  She is now on sildenafil  20 mg tid and selexipag  1000 mcg bid, unable to increase selexipag  further due to symptoms.  Echo 8/23: normal RV and mildly elevated PA pressure. Echo 03/24: EF 65-70%, severe diastolic dysfunction, RV okay, mild to moderate MR, mild TR, RVSP at least 67 mmHg, IVC dilated with estimated RAP at least 15 mmHg. Echo 11/24: PASP 50 mmHg with mild RV dysfunction.  - Continue Uptravi  1000 mg bid.  - Continue sildenafil  20 tid.  This was decreased in the past due to orthostasis. May need to stop.   3. Pulmonary: s/p PNA with parapneumonic effusion and eventual VATS  with decortication.  As above, restrictive PFTs but no ILD on high resolution CT.  CT in 4/20 showed a concerning RLL nodule and emphysema, though she never smoked. Her pulmonologist ordered a PET scan after this per her report, and PET was normal.   4. Atrial fibrillation: Chronic. Off Toprol  XL d/t hypotension. HR is controlled.  Xarelto  stopped during 4/24 admit with subdural hematoma.  This is a difficult situation as she has had ischemic stroke off anticoagulation in the past. Would not be a good candidate for Watchman Device.  At neurosurgery visit, she was cleared to restart ASA 81 or possibly DOAC if we felt that fall risk was controlled.   Continue apixaban  2.5 mg bid (lower dose given age > 80 and weight < 60 kg).   5. Orthostatic hypotension: She has a long history of this.  Continues to take midodrine  a few times a week.   - Continue compression stockings.  - She did not tolerate pyridostigmine  in the past due to hot flashes.  She says she would be willing to try it again if needed.  - Continue midodrine  10 mg prn.  This seems to be working reasonably well, she takes it if her SBP is <100.   6. CKD: Stage 3b.    7. Aortic stenosis: Mild on 11/24 echo. Repeat Echo. She has had presyncope I want to reassess.   8. Hx of CVA: She had a stroke in 11/22 while off Xarelto .  Carotid dopplers were unremarkable.  Her stroke was atrial fibrillation-related. This year had subdural hematoma and hip fracture after separate mechanical falls. Did not require neurosurgical intervention. Xarelto  was discontinued.  - Continue Eliquis  2.5 mg bid.    Follow up in  2 months with an ECHo    Authur Cubit NP-C  06/23/2023  [facility] [address] [facility] [place] [address] [phone] (office) [phone] (fax)

## 2023-07-04 ENCOUNTER — Other Ambulatory Visit (HOSPITAL_COMMUNITY): Payer: Self-pay | Admitting: Pharmacist

## 2023-07-04 MED ORDER — UPTRAVI 1000 MCG PO TABS: 1000.0000 ug | ORAL_TABLET | Freq: Two times a day (BID) | ORAL | 3 refills | Status: AC

## 2023-08-23 ENCOUNTER — Ambulatory Visit (HOSPITAL_BASED_OUTPATIENT_CLINIC_OR_DEPARTMENT_OTHER)
Admission: RE | Admit: 2023-08-23 | Discharge: 2023-08-23 | Disposition: A | Discharge status: Home / Self Care | Source: Ambulatory Visit | Attending: Cardiology | Admitting: Cardiology

## 2023-08-23 ENCOUNTER — Ambulatory Visit (HOSPITAL_COMMUNITY)
Admission: RE | Admit: 2023-08-23 | Discharge: 2023-08-23 | Disposition: A | Discharge status: Home / Self Care | Source: Ambulatory Visit | Attending: Adult Health | Admitting: Adult Health

## 2023-08-23 ENCOUNTER — Encounter (HOSPITAL_COMMUNITY): Payer: Self-pay | Admitting: Cardiology

## 2023-08-23 ENCOUNTER — Ambulatory Visit (HOSPITAL_COMMUNITY): Payer: Self-pay | Admitting: Cardiology

## 2023-08-23 VITALS — BP 162/80 | HR 60 | Ht 67.0 in | Wt 135.0 lb

## 2023-08-23 DIAGNOSIS — Z8673 Personal history of transient ischemic attack (TIA), and cerebral infarction without residual deficits: Secondary | ICD-10-CM | POA: Diagnosis not present

## 2023-08-23 DIAGNOSIS — Z7901 Long term (current) use of anticoagulants: Secondary | ICD-10-CM | POA: Insufficient documentation

## 2023-08-23 DIAGNOSIS — N1832 Chronic kidney disease, stage 3b: Secondary | ICD-10-CM | POA: Insufficient documentation

## 2023-08-23 DIAGNOSIS — I350 Nonrheumatic aortic (valve) stenosis: Secondary | ICD-10-CM | POA: Diagnosis not present

## 2023-08-23 DIAGNOSIS — I5032 Chronic diastolic (congestive) heart failure: Secondary | ICD-10-CM | POA: Diagnosis not present

## 2023-08-23 DIAGNOSIS — Z853 Personal history of malignant neoplasm of breast: Secondary | ICD-10-CM | POA: Insufficient documentation

## 2023-08-23 DIAGNOSIS — I482 Chronic atrial fibrillation, unspecified: Secondary | ICD-10-CM | POA: Insufficient documentation

## 2023-08-23 DIAGNOSIS — I351 Nonrheumatic aortic (valve) insufficiency: Secondary | ICD-10-CM | POA: Diagnosis present

## 2023-08-23 DIAGNOSIS — R0609 Other forms of dyspnea: Secondary | ICD-10-CM | POA: Insufficient documentation

## 2023-08-23 DIAGNOSIS — I340 Nonrheumatic mitral (valve) insufficiency: Secondary | ICD-10-CM | POA: Diagnosis not present

## 2023-08-23 DIAGNOSIS — I130 Hypertensive heart and chronic kidney disease with heart failure and stage 1 through stage 4 chronic kidney disease, or unspecified chronic kidney disease: Secondary | ICD-10-CM | POA: Insufficient documentation

## 2023-08-23 DIAGNOSIS — I272 Pulmonary hypertension, unspecified: Secondary | ICD-10-CM | POA: Insufficient documentation

## 2023-08-23 DIAGNOSIS — I951 Orthostatic hypotension: Secondary | ICD-10-CM | POA: Insufficient documentation

## 2023-08-23 LAB — ECHOCARDIOGRAM COMPLETE
AR max vel: 1.01 cm2
AV Area VTI: 1.05 cm2
AV Area mean vel: 1.06 cm2
AV Mean grad: 14 mmHg
AV Peak grad: 25.9 mmHg
Ao pk vel: 2.55 m/s
Area-P 1/2: 4.17 cm2
Calc EF: 61.2 %
S' Lateral: 2.2 cm
Single Plane A2C EF: 64.4 %
Single Plane A4C EF: 60.9 %

## 2023-08-23 LAB — LIPID PANEL
Cholesterol: 155 mg/dL (ref 0–200)
HDL: 47 mg/dL (ref >40)
LDL Cholesterol: 96 mg/dL (ref 0–99)
Total CHOL/HDL Ratio: 3.3 ratio
Triglycerides: 59 mg/dL (ref <150)
VLDL: 12 mg/dL (ref 0–40)

## 2023-08-23 LAB — BASIC METABOLIC PANEL WITH GFR
Anion gap: 10 (ref 5–15)
BUN: 29 mg/dL — ABNORMAL HIGH (ref 8–23)
CO2: 27 mmol/L (ref 22–32)
Calcium: 9.5 mg/dL (ref 8.9–10.3)
Chloride: 102 mmol/L (ref 98–111)
Creatinine, Ser: 1.65 mg/dL — ABNORMAL HIGH (ref 0.44–1.00)
GFR, Estimated: 29 mL/min — ABNORMAL LOW (ref >60)
Glucose, Bld: 93 mg/dL (ref 70–99)
Potassium: 3.9 mmol/L (ref 3.5–5.1)
Sodium: 139 mmol/L (ref 135–145)

## 2023-08-23 LAB — BRAIN NATRIURETIC PEPTIDE: B Natriuretic Peptide: 322 pg/mL — ABNORMAL HIGH (ref 0.0–100.0)

## 2023-08-23 MED ORDER — TORSEMIDE 20 MG PO TABS: ORAL_TABLET | ORAL | 3 refills | Status: AC

## 2023-08-23 NOTE — Patient Instructions (Signed)
 Medication Changes:  INCREASE Torsemide  to 60 mg (3 tabs) in AM and 40 mg (2 tabs) in PM  Lab Work:  Labs done today, your results will be available in MyChart, we will contact you for abnormal readings.  Your physician recommends that you return for lab work in: 1-2 weeks  Special Instructions // Education:  Do the following things EVERYDAY: Weigh yourself in the morning before breakfast. Write it down and keep it in a log. Take your medicines as prescribed Eat low salt foods--Limit salt (sodium) to 2000 mg per day.  Stay as active as you can everyday Limit all fluids for the day to less than 2 liters   Follow-Up in: 2 months   At the [facility], you and your health needs are our priority. We have a designated team specialized in the treatment of Heart Failure. This Care Team includes your primary Heart Failure Specialized Cardiologist (physician), Advanced Practice Providers (APPs- Physician Assistants and Nurse Practitioners), and Pharmacist who all work together to provide you with the care you need, when you need it.   You may see any of the following providers on your designated Care Team at your next follow up:  Dr. Toribio Fuel Dr. Ezra Shuck Dr. Ria Commander Dr. Odis Brownie Greig Mosses, NP Caffie Shed, [place] [facility] [place], [place] Beckey Coe, NP [place] Lee, NP Tinnie Redman, PharmD   Please be sure to bring in all your medications bottles to every appointment.   Need to Contact Us :  If you have any questions or concerns before your next appointment please send us  a message through [place] or call our office at [phone].    TO LEAVE A MESSAGE FOR THE NURSE SELECT OPTION 2, PLEASE LEAVE A MESSAGE INCLUDING: YOUR NAME DATE OF BIRTH CALL BACK NUMBER REASON FOR CALL**this is important as we prioritize the call backs  YOU WILL RECEIVE A CALL BACK THE SAME DAY AS LONG AS YOU CALL BEFORE 4:00 PM

## 2023-08-24 NOTE — Telephone Encounter (Signed)
-----   Message from Ezra Shuck sent at 08/23/2023  3:43 PM EDT ----- Stable lipids, no changes.  Make sure to repeat BMET at 10 days with higher dose of torsemide .  ----- Message ----- From: Interface, Lab In [place] Sent: 08/23/2023   2:13 PM EDT To: Ezra GORMAN Shuck, MD

## 2023-08-24 NOTE — Telephone Encounter (Signed)
 Spoke to patient and aware of lab work. Aware of lab appointment as previously scheduled

## 2023-08-24 NOTE — Progress Notes (Signed)
 [facility] Note  Date:  08/24/2023  ID:  Rachael Myers, DOB [birth year], MRN [id number]  Type of Visit: Established patient PCP:  Rachael Charlie CROME., MD  HF Cardiologist:  Dr. Rolan  Chief complaint: Heart Failure    HPI: Rachael Myers is a 89 y.o. female who has a history of chronic diastolic CHF, chronic atrial fibrillation (failed multaq and propafenone), and prominent exertional dyspnea.  In 1/16, she developed PNA.  She had a parapneumonic effusion that required 4 thoracenteses.  Eventually, she had VATS with decortication and a wedge resection.  Cytology was negative from the pleural fluid.  Echo 11/16: normal LV EF and apparently normal RV EF.  PA systolic pressure estimation was moderate to severely increased.  RHC 12/16: moderate pulmonary arterial hypertension.  PFTs showed a restrictive pattern concerning for interstitial lung disease.  V/Q scan in 12/16 was not suggestive of chronic PE.  Despite abnormal PFTs, high resolution CT did not show interstitial lung disease.     She developed a cough and mouth soreness with Opsumit and had to stop it after about 7 days.  Symptoms resolved when she stopped it.  Tried to get Adcirca for her.  This was denied by her insurance company, so she was started on Revatio  20 mg tid instead.  She thinks that this has helped her breathing a little.  She saw a pulmonary specialist who thought dyspnea was likely due to [facility].  Next, tried her on ambrisentan , but she developed facial swelling on ambrisentan  (significant around the eyes).  She also developed worsening dyspnea.  She stopped ambrisentan  and the swelling resolved but she was noted to be volume overloaded.  Switched her Lasix  to torsemide .  She was started on selexipag  which she has tolerated.    She had an episode of PNA in 2/20.  CT C/A/P 4/20 showed RLL nodule concerning for malignancy and pancreatic cysts, likely indolent cystic pancreatic neoplasm. There was moderate  emphysema though she never smoked.  She says that her pulmonologist in [facility] followed up on her CT with a PET scan that was normal.   Echo 5/2:  EF 60-65%, mild LVH, PASP 33 mmHg, normal RV, mild MR, mild-moderate AS with mean gradient 14 mmHg, AVA 1.23 cm^2.  PYP scan in 5/21 was not suggestive of TTR amyloidosis.   [name] and had mastectomy (no chemo or radiation).   Echo 5/22: EF 55-60%, mildly decreased RV systolic function, PASP 47, mild MR, mild AS mean gradient 10 mmHg.   She had a fall in 11/22 at home, seems to have been mechanical.  She developed a scalp hematoma and bruising on her arm.  Xarelto  was held.  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  Xarelto  was restarted.  She was sent to rehab for 3 wks and was not given her torsemide .    Patient was admitted twice at the hospital in [facility] in 4/23, once with diastolic CHF and once with ?TIA.  Workup was negative for recurrent CVA.  Echo in 4/23 showed EF 55-60%, mild LVH, mild RV dilation with decreased systolic function, mild AS with mean gradient 15 mmHg, PASP 33 mmHg.  She stopped Jardiance , does not feel like she could tolerate it.   Echo 8/23: EF 55-60%, mild LVH, normal RV, PASP 42 mmHg, dilated IVC.   She was admitted to [facility] 02/21-02/26 with left femoral neck fracture after a fall and undewrent  ORIF with cannulated screws. Course c/b AKI. She was given IV fluids and Torsemide  stopped. She was discharged to rehab. Torsemide  later restarted and eventually required addition of IV lasix . Underwent right thoracentesis as well. Once discharge from rehab she was readmitted to [facility] for additional diuresis. She was discharged on 80 mg furosemide  daily and 50 mg spironolactone daily. Discharge weight 138 lb. Echo during admit with EF 65-70%, severe diastolic dysfunction, RV okay, mild to moderate MR, mild TR, RVSP at least 67 mmHg, IVC dilated with estimated RAP at least 15  mmHg  The patient was admitted to [facility] with SDH on 04/20/22 after a fall at home.  Had been falling at home as well as episodes of hypotension. Neurosurgery consulted and no surgical intervention recommended. Diuretics and pulmonary hypertension meds initially held. At discharge she was sent home on sildenafil  20 mg TID (hypotension on higher doses), lasix  20 mg BID and uptravi  400 mcg BID.  Metoprolol  discontinued and Xarelto  stopped as well.  Echo 11/24 EF 60-65%, mild aortic stenosis with mean gradient 10 mmHg, mildly decreased RV systolic function, mild MR, PASP 50 mmHg.   Echo was done today and reviewed, EF 60-65% with mild RV enlargement and mild RV dysfunction, PASP 49 mmHg, mild-moderate MR, mild-moderate AS with mean gradient 14 and AVA 1.05 cm^2.   Today she returns for HF follow up.  Weight down 2 lbs.  BP still fluctuates extremely, running from SBP 80s-160s.  She brings readings with her today.  She takes midodrine  when SBP < 90.  No falls.  She has not had any worrisome lightheadedness but feels fatigued when BP is low.  She does her own housework.  She walks with a walker, no dyspnea walking around the house.  She is short of breath walking longer distances outside the house.  No chest pain.  No orthopnea/PND.    Labs (12/24): LDL 93 Labs (3/25): K 4.2, creatinine 1.3  PMH: 1. Cardiolite  (9/16) with EF 54%, no ischemia/infarction. 2. Chronic diastolic CHF: Echo (11/16) with EF 55-60%, normal RV size and systolic function, PA systolic pressure 66 mmHg.  - Echo (7/17) with EF 60-65%, mildly dilated RV with mildly decreased systolic function, PA systolic pressure 36 mmHg.  - Echo (7/18) with EF 55-60%, mild to moderate AI, normal RV size and systolic function, PASP 43 mmHg.  - Echo (1/20) with EF 55-60%, normal RV size and systolic function, mild AI, mild MR.  - Echo (5/21) witih EF 60-65%, mild LVH, PASP 33 mmHg, normal RV, mild MR, mild-moderate AS with mean gradient 14 mmHg, AVA  1.23 cm^2.  - PYP scan (5/21): grade 0, H/CL 1.1 (not suggestive of TTR amyloidosis).  - Echo (5/22): EF 55-60%, mildly decreased RV systolic function, PASP 47, mild MR, mild AS mean gradient 10 mmHg. - Echo (4/23): EF 55-60%, mild LVH, mild RV dilation with decreased systolic function, mild AS with mean gradient 15 mmHg, PASP 33 mmHg. - Echo (8/23): EF 55-60%, mild LVH, normal RV, PASP 42 mmHg, dilated IVC - Echo (3/24): EF 65-70%, severe diastolic dysfunction, RV okay, mild to moderate MR, mild TR, RVSP at least 67 mmHg, IVC dilated with estimated RAP at least 15 mmHg - Echo (11/24): EF 60-65%, mild aortic stenosis with mean gradient 10 mmHg, mildly decreased RV systolic function, mild MR, PASP 50 mmHg.  - Echo (8/25): EF 60-65% with mild RV enlargement and mild RV dysfunction, PASP 49 mmHg, mild-moderate MR, mild-moderate AS with mean gradient 14 and AVA 1.05  cm^2.  3. Chronic atrial fibrillation: She has been intolerant of Multaq and propafenone.  Sotalol and flecainide have not been used due to CKD.   4. SBO: Managed conservatively. 5. CKD stage 3 6. OA: h/o THR and TKR.  7. H/o hyponatremia 8. Pneumonia with parapneumonic effusion with thoracenteses and eventual VATS and decortication.  9. Pulmonary hypertension: Suspect Group 1 pulmonary hypertension.  RHC (12/16) with mean RA 5, PA 62/22 mean 40, mean PCWP 19, CI 2.46, PVR 5 WU.  PFTs (12/16) wiith FVC 71%, FEV1 73%, ratio 104%, TLC 71%, DLCO 53% => moderate restriction concerning for interstitial lung disease.  High resolution CT (12/16) did not show evidence for interstitial lung disease.  V/Q scan (12/16) did not show evidence for chronic PE.  Serologies: RF negative, scleroderma antibodies negative, ANA positive but only 1:80 titer, SSA/B negative, dsDNA negative, CCP negative.  She did not tolerate macitentan or ambrisentan .  Echo 7/17 with PA systolic pressure down to 36 mmHg.  Echo in 11/24 with PASP 50 mmHg, mild RV dysfunction.  Echo  8/25 with PASP 49, mild RV dysfunction.  10. Lightheadedness/syncope: Extensive workup.  Event and holter monitors as well as telemetry monitoring negative.  She has not been orthostatic.  - Did not tolerate pyridostigmine  due to hot flashes.  11. Sciatica: Status post back surgery 1/18.  12. COVID-19 infection 12/20.  13. Peripheral neuropathy 14. Aortic stenosis: Mild to moderate on 5/21 echo.  - Mild on 4/23 echo.  - Mild on 11/24 echo.  - Mild-moderate on 8/25 echo 15. Breast cancer: Triple negative on left, s/p mastectomy in 7/21.  16. CVA: 11/22, occurred while off anticoagulation.  - Carotid dopplers (11/22) showed 1-39% BICA stenosis.  17. Traumatic subdural hematoma: 4/24.  18. Autonomic neuropathy with orthostatic hypotension   Current Outpatient Medications  Medication Sig Dispense Refill   amoxicillin (AMOXIL) 500 MG capsule As needed before dental procedures     apixaban  (ELIQUIS ) 2.5 MG TABS tablet Take 1 tablet (2.5 mg total) by mouth 2 (two) times daily. 180 tablet 3   midodrine  (PROAMATINE ) 10 MG tablet Take 1 tablet (10 mg total) by mouth as needed. 30 tablet 3   potassium chloride  (KLOR-CON  M) 10 MEQ tablet Take 4 tablets (40 mEq total) by mouth 2 (two) times daily. 240 tablet 3   Selexipag  (UPTRAVI ) 1000 MCG TABS Take 1,000 mcg by mouth in the morning and at bedtime. 180 tablet 3   sildenafil  (REVATIO ) 20 MG tablet Take 1 tablet (20 mg total) by mouth 3 (three) times daily. 270 tablet 3   torsemide  (DEMADEX ) 20 MG tablet Take 3 tablets (60 mg total) by mouth every morning AND 2 tablets (40 mg total) every evening. 450 tablet 3   No current facility-administered medications for this encounter.   Allergies:   Acetaminophen , Hydrocodone-acetaminophen , Letairis  [ambrisentan ], Opsumit [macitentan], Ace inhibitors, Atenolol, Hctz [hydrochlorothiazide], Oxycodone, and Diltiazem hcl   Social History:  The patient  reports that she has never smoked. She has never used  smokeless tobacco. She reports that she does not drink alcohol and does not use drugs.   Family History:  The patient's family history includes Cancer in her mother.   ROS:  Please see the history of present illness.   All other systems are personally reviewed and negative.   Recent Labs: 11/22/2022: Hemoglobin 13.2; Platelets 212 08/23/2023: B Natriuretic Peptide 322.0; BUN 29; Creatinine, Ser 1.65; Potassium 3.9; Sodium 139    Wt Readings from Last 3 Encounters:  08/23/23 61.2 kg (135 lb)  06/23/23 62.1 kg (137 lb)  04/14/23 60.3 kg (133 lb)   Physical Exam:   BP (!) 162/80   Pulse 60   Ht 5' 7 (1.702 m)   Wt 61.2 kg (135 lb)   SpO2 100%   BMI 21.14 kg/m  General: NAD, frail Neck: JVP 10 cm with HJR, no thyromegaly or thyroid  nodule.  Lungs: Clear to auscultation bilaterally with normal respiratory effort. CV: Nondisplaced PMI.  Heart regular S1/S2, no S3/S4, 3/6 SEM RUSB with clear S2.  No peripheral edema.  No carotid bruit.  Normal pedal pulses.  Abdomen: Soft, nontender, no hepatosplenomegaly, no distention.  Skin: Intact without lesions or rashes.  Neurologic: Alert and oriented x 3.  Psych: Normal affect. Extremities: No clubbing or cyanosis.  HEENT: Normal.   Assessment & Plan: 1. Chronic diastolic CHF:  Echo today showed EF 60-65% with mild RV enlargement and mild RV dysfunction, PASP 49 mmHg, mild-moderate MR, mild-moderate AS with mean gradient 14 and AVA 1.05 cm^2. NYHA class II-III, mild volume overload on exam.  - Carefully increase torsemide  to 60 qam/40 qpm (being aware of risk of increased orthostatic symptoms). BMET/BNP today, BMET in 10 days.  - She is now off spironolactone.   - She did not tolerate Jardiance .   2. Pulmonary hypertension: Moderate to severe pulmonary hypertension by echo (PASP 66 mmHg). RHC showed moderate pulmonary arterial hypertension with PVR 5 WU.  PFTs with restrictive changes suggestive of interstitial process but high resolution CT  did not show interstitial lung disease.  No evidence for chronic PE on V/Q scan.  Rheumatologic serologies all negative.  Possible group 1 pulmonary hypertension.  She was unable to tolerate macitentan or ambrisentan .  Insurance would not approve Adcirca.  She is now on sildenafil  20 mg tid and selexipag  1000 mcg bid, unable to increase selexipag  further due to symptoms.  Echo today showed mild RV dysfunction with PASP estimated 49 mmHg.  - Continue Uptravi  1000 mg bid.  - Continue sildenafil  20 tid.  This was decreased in the past due to orthostasis.  3. Pulmonary: s/p PNA with parapneumonic effusion and eventual VATS with decortication.  As above, restrictive PFTs but no ILD on high resolution CT.  CT in 4/20 showed a concerning RLL nodule and emphysema, though she never smoked. Her pulmonologist ordered a PET scan after this per her report, and PET was normal.  4. Atrial fibrillation: Chronic. Off Toprol  XL due to hypotension. HR is controlled.  Xarelto  stopped during 4/24 admit with subdural hematoma.  Would not be a good candidate for Watchman Device.  She was eventually restarted on apixaban  2.5 mg bid (lower dose given age > 80 and weight < 60 kg).  5. Hypertension + orthostatic hypotension: Long history of marked BP fluctuations.  Not getting episodes of lightheadedness currently, takes midodrine  when SBP < 90.  - Will need to avoid antihypertensives, had SDH with fall in the past. No recent falls.   - Continue compression stockings.  - She did not tolerate pyridostigmine  in the past due to hot flashes.   - Continue midodrine  10 mg prn.  This seems to be working reasonably well, she takes it if her SBP is < 90.   6. CKD: Stage 3b. BMET today.  7. Aortic stenosis: Mild-moderate on 8/25 echo.  8. Hx of CVA: She had a stroke in 11/22 while off Xarelto .  Carotid dopplers were unremarkable.  Her stroke was atrial fibrillation-related.  - Continue  Eliquis  2.5 mg bid.   Follow up in  2 months with  APP  I spent 32 minutes reviewing data, interviewing patient, and organizing the orders/followup.   Ezra Shuck  08/24/2023  [facility] [address] [facility] [place] [address] [phone] (office) [phone] (fax)

## 2023-09-06 ENCOUNTER — Other Ambulatory Visit (HOSPITAL_COMMUNITY)

## 2023-09-08 ENCOUNTER — Ambulatory Visit (HOSPITAL_COMMUNITY)
Admission: RE | Admit: 2023-09-08 | Discharge: 2023-09-08 | Disposition: A | Discharge status: Home / Self Care | Source: Ambulatory Visit | Attending: Cardiology | Admitting: Cardiology

## 2023-09-08 DIAGNOSIS — I5032 Chronic diastolic (congestive) heart failure: Secondary | ICD-10-CM | POA: Diagnosis present

## 2023-09-08 LAB — BASIC METABOLIC PANEL WITH GFR
Anion gap: 11 (ref 5–15)
BUN: 22 mg/dL (ref 8–23)
CO2: 26 mmol/L (ref 22–32)
Calcium: 9.4 mg/dL (ref 8.9–10.3)
Chloride: 102 mmol/L (ref 98–111)
Creatinine, Ser: 1.43 mg/dL — ABNORMAL HIGH (ref 0.44–1.00)
GFR, Estimated: 34 mL/min — ABNORMAL LOW (ref >60)
Glucose, Bld: 96 mg/dL (ref 70–99)
Potassium: 3.9 mmol/L (ref 3.5–5.1)
Sodium: 139 mmol/L (ref 135–145)

## 2023-09-29 ENCOUNTER — Other Ambulatory Visit (HOSPITAL_COMMUNITY): Payer: Self-pay

## 2023-09-29 MED ORDER — POTASSIUM CHLORIDE CRYS ER 10 MEQ PO TBCR: 40.0000 meq | EXTENDED_RELEASE_TABLET | Freq: Two times a day (BID) | ORAL | 3 refills | Status: DC

## 2023-10-07 ENCOUNTER — Telehealth (HOSPITAL_COMMUNITY): Payer: Self-pay

## 2023-10-07 ENCOUNTER — Other Ambulatory Visit (HOSPITAL_COMMUNITY): Payer: Self-pay

## 2023-10-07 NOTE — Telephone Encounter (Addendum)
 Advanced Heart Failure Patient Advocate Encounter  Prior authorization for Uptravi  has been submitted and approved.  Key: [id number] Effective: 09/07/2023 to 01/03/2098  Rachel DEL, CPhT Rx Patient Advocate Phone: [phone]

## 2023-10-10 ENCOUNTER — Other Ambulatory Visit (HOSPITAL_COMMUNITY): Payer: Self-pay

## 2023-10-17 ENCOUNTER — Other Ambulatory Visit (HOSPITAL_COMMUNITY): Payer: Self-pay

## 2023-10-17 DIAGNOSIS — I5032 Chronic diastolic (congestive) heart failure: Secondary | ICD-10-CM

## 2023-10-17 MED ORDER — POTASSIUM CHLORIDE CRYS ER 10 MEQ PO TBCR: 40.0000 meq | EXTENDED_RELEASE_TABLET | Freq: Two times a day (BID) | ORAL | 3 refills | Status: AC

## 2023-10-24 ENCOUNTER — Encounter (HOSPITAL_COMMUNITY): Payer: Self-pay

## 2023-10-24 ENCOUNTER — Ambulatory Visit (HOSPITAL_COMMUNITY)
Admission: RE | Admit: 2023-10-24 | Discharge: 2023-10-24 | Disposition: A | Discharge status: Home / Self Care | Source: Ambulatory Visit | Attending: Family Medicine | Admitting: Family Medicine

## 2023-10-24 VITALS — BP 188/90 | HR 66 | Wt 126.0 lb

## 2023-10-24 DIAGNOSIS — Z79899 Other long term (current) drug therapy: Secondary | ICD-10-CM | POA: Diagnosis not present

## 2023-10-24 DIAGNOSIS — M79606 Pain in leg, unspecified: Secondary | ICD-10-CM | POA: Insufficient documentation

## 2023-10-24 DIAGNOSIS — Z8673 Personal history of transient ischemic attack (TIA), and cerebral infarction without residual deficits: Secondary | ICD-10-CM | POA: Diagnosis not present

## 2023-10-24 DIAGNOSIS — N1832 Chronic kidney disease, stage 3b: Secondary | ICD-10-CM | POA: Diagnosis not present

## 2023-10-24 DIAGNOSIS — J948 Other specified pleural conditions: Secondary | ICD-10-CM | POA: Insufficient documentation

## 2023-10-24 DIAGNOSIS — I482 Chronic atrial fibrillation, unspecified: Secondary | ICD-10-CM | POA: Insufficient documentation

## 2023-10-24 DIAGNOSIS — J439 Emphysema, unspecified: Secondary | ICD-10-CM | POA: Diagnosis not present

## 2023-10-24 DIAGNOSIS — I272 Pulmonary hypertension, unspecified: Secondary | ICD-10-CM | POA: Diagnosis not present

## 2023-10-24 DIAGNOSIS — I5032 Chronic diastolic (congestive) heart failure: Secondary | ICD-10-CM | POA: Diagnosis present

## 2023-10-24 DIAGNOSIS — I350 Nonrheumatic aortic (valve) stenosis: Secondary | ICD-10-CM | POA: Insufficient documentation

## 2023-10-24 DIAGNOSIS — I951 Orthostatic hypotension: Secondary | ICD-10-CM | POA: Diagnosis not present

## 2023-10-24 DIAGNOSIS — I10 Essential (primary) hypertension: Secondary | ICD-10-CM

## 2023-10-24 DIAGNOSIS — J918 Pleural effusion in other conditions classified elsewhere: Secondary | ICD-10-CM | POA: Diagnosis not present

## 2023-10-24 DIAGNOSIS — H539 Unspecified visual disturbance: Secondary | ICD-10-CM

## 2023-10-24 DIAGNOSIS — I130 Hypertensive heart and chronic kidney disease with heart failure and stage 1 through stage 4 chronic kidney disease, or unspecified chronic kidney disease: Secondary | ICD-10-CM | POA: Diagnosis not present

## 2023-10-24 DIAGNOSIS — I639 Cerebral infarction, unspecified: Secondary | ICD-10-CM

## 2023-10-24 NOTE — Progress Notes (Signed)
 [facility] Note  PCP:  Geroge Charlie CROME., MD  HF Cardiologist:  Dr. Rolan  HPI: Rachael Myers is a 89 y.o. female who has a history of chronic diastolic CHF, chronic atrial fibrillation (failed multaq and propafenone), and prominent exertional dyspnea.  In 1/16, she developed PNA.  She had a parapneumonic effusion that required 4 thoracenteses.  Eventually, she had VATS with decortication and a wedge resection.  Cytology was negative from the pleural fluid.  Echo 11/16: normal LV EF and apparently normal RV EF.  PA systolic pressure estimation was moderate to severely increased.  RHC 12/16: moderate pulmonary arterial hypertension.  PFTs showed a restrictive pattern concerning for interstitial lung disease.  V/Q scan in 12/16 was not suggestive of chronic PE.  Despite abnormal PFTs, high resolution CT did not show interstitial lung disease.     She developed a cough and mouth soreness with Opsumit and had to stop it after about 7 days.  Symptoms resolved when she stopped it.  Tried to get Adcirca for her.  This was denied by her insurance company, so she was started on Revatio  20 mg tid instead.  She thinks that this has helped her breathing a little.  She saw a pulmonary specialist who thought dyspnea was likely due to [facility].  Next, tried her on ambrisentan , but she developed facial swelling on ambrisentan  (significant around the eyes).  She also developed worsening dyspnea.  She stopped ambrisentan  and the swelling resolved but she was noted to be volume overloaded.  Switched her Lasix  to torsemide .  She was started on selexipag  which she has tolerated.    She had an episode of PNA in 2/20.  CT C/A/P 4/20 showed RLL nodule concerning for malignancy and pancreatic cysts, likely indolent cystic pancreatic neoplasm. There was moderate emphysema though she never smoked.  She says that her pulmonologist in [facility] followed up on her CT with a PET scan that was normal.   Echo 5/2:   EF 60-65%, mild LVH, PASP 33 mmHg, normal RV, mild MR, mild-moderate AS with mean gradient 14 mmHg, AVA 1.23 cm^2.  PYP scan in 5/21 was not suggestive of TTR amyloidosis.   [name] and had mastectomy (no chemo or radiation).   Echo 5/22: EF 55-60%, mildly decreased RV systolic function, PASP 47, mild MR, mild AS mean gradient 10 mmHg.   She had a fall in 11/22 at home, seems to have been mechanical.  She developed a scalp hematoma and bruising on her arm.  Xarelto  was held.  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  Xarelto  was restarted.  She was sent to rehab for 3 wks and was not given her torsemide .    Patient was admitted twice at the hospital in [facility] in 4/23, once with diastolic CHF and once with ?TIA.  Workup was negative for recurrent CVA.  Echo in 4/23 showed EF 55-60%, mild LVH, mild RV dilation with decreased systolic function, mild AS with mean gradient 15 mmHg, PASP 33 mmHg.  She stopped Jardiance , does not feel like she could tolerate it.   Echo 8/23: EF 55-60%, mild LVH, normal RV, PASP 42 mmHg, dilated IVC.   She was admitted to [facility] 02/21-02/26 with left femoral neck fracture after a fall and undewrent ORIF with cannulated screws. Course c/b AKI. She was given IV fluids and Torsemide  stopped. She was discharged to rehab. Torsemide  later restarted and eventually required  addition of IV lasix . Underwent right thoracentesis as well. Once discharge from rehab she was readmitted to [facility] for additional diuresis. She was discharged on 80 mg furosemide  daily and 50 mg spironolactone daily. Discharge weight 138 lb. Echo during admit with EF 65-70%, severe diastolic dysfunction, RV okay, mild to moderate MR, mild TR, RVSP at least 67 mmHg, IVC dilated with estimated RAP at least 15 mmHg  The patient was admitted to [facility] with SDH on 04/20/22 after a fall at home.  Had been falling at home as well as episodes of hypotension.  Neurosurgery consulted and no surgical intervention recommended. Diuretics and pulmonary hypertension meds initially held. At discharge she was sent home on sildenafil  20 mg TID (hypotension on higher doses), lasix  20 mg BID and uptravi  400 mcg BID.  Metoprolol  discontinued and Xarelto  stopped as well.  Echo 11/24 EF 60-65%, mild aortic stenosis with mean gradient 10 mmHg, mildly decreased RV systolic function, mild MR, PASP 50 mmHg.   Echo 8/25 EF 60-65% with mild RV enlargement and mild RV dysfunction, PASP 49 mmHg, mild-moderate MR, mild-moderate AS with mean gradient 14 and AVA 1.05 cm^2.  Today she returns for HF follow up with her friend who helps take care of her. Overall feeling fine. She is struggling with leg pain, sees Ortho this week. She is not SOB with activity or ADLs, ortho issues limit her. She has episodes of lightheadeness, no recent falls. She is able to do housework and ADLs and uses a rolling walker around the house. Had episode yesterday of transient blurred vision, resolved spontaneously. Had dull HA rest of the day. Denies palpitations, abnormal bleeding, CP, edema, or PND/Orthopnea. Appetite ok. Weight at home 125 pounds. Taking all medications. Takes midodrine  when sBP<90, BP in AM typically 160-180s.  Labs (12/24): LDL 93 Labs (3/25): K 4.2, creatinine 1.3 Labs (8/25): LDL 96 Labs (9/25): K 3.9, creatinine 1.43  PMH: 1. Cardiolite  (9/16) with EF 54%, no ischemia/infarction. 2. Chronic diastolic CHF: Echo (11/16) with EF 55-60%, normal RV size and systolic function, PA systolic pressure 66 mmHg.  - Echo (7/17) with EF 60-65%, mildly dilated RV with mildly decreased systolic function, PA systolic pressure 36 mmHg.  - Echo (7/18) with EF 55-60%, mild to moderate AI, normal RV size and systolic function, PASP 43 mmHg.  - Echo (1/20) with EF 55-60%, normal RV size and systolic function, mild AI, mild MR.  - Echo (5/21) witih EF 60-65%, mild LVH, PASP 33 mmHg, normal RV, mild  MR, mild-moderate AS with mean gradient 14 mmHg, AVA 1.23 cm^2.  - PYP scan (5/21): grade 0, H/CL 1.1 (not suggestive of TTR amyloidosis).  - Echo (5/22): EF 55-60%, mildly decreased RV systolic function, PASP 47, mild MR, mild AS mean gradient 10 mmHg. - Echo (4/23): EF 55-60%, mild LVH, mild RV dilation with decreased systolic function, mild AS with mean gradient 15 mmHg, PASP 33 mmHg. - Echo (8/23): EF 55-60%, mild LVH, normal RV, PASP 42 mmHg, dilated IVC - Echo (3/24): EF 65-70%, severe diastolic dysfunction, RV okay, mild to moderate MR, mild TR, RVSP at least 67 mmHg, IVC dilated with estimated RAP at least 15 mmHg - Echo (11/24): EF 60-65%, mild aortic stenosis with mean gradient 10 mmHg, mildly decreased RV systolic function, mild MR, PASP 50 mmHg.  - Echo (8/25): EF 60-65% with mild RV enlargement and mild RV dysfunction, PASP 49 mmHg, mild-moderate MR, mild-moderate AS with mean gradient 14 and AVA 1.05 cm^2.  3. Chronic atrial fibrillation:  She has been intolerant of Multaq and propafenone.  Sotalol and flecainide have not been used due to CKD.   4. SBO: Managed conservatively. 5. CKD stage 3 6. OA: h/o THR and TKR.  7. H/o hyponatremia 8. Pneumonia with parapneumonic effusion with thoracenteses and eventual VATS and decortication.  9. Pulmonary hypertension: Suspect Group 1 pulmonary hypertension.  RHC (12/16) with mean RA 5, PA 62/22 mean 40, mean PCWP 19, CI 2.46, PVR 5 WU.  PFTs (12/16) wiith FVC 71%, FEV1 73%, ratio 104%, TLC 71%, DLCO 53% => moderate restriction concerning for interstitial lung disease.  High resolution CT (12/16) did not show evidence for interstitial lung disease.  V/Q scan (12/16) did not show evidence for chronic PE.  Serologies: RF negative, scleroderma antibodies negative, ANA positive but only 1:80 titer, SSA/B negative, dsDNA negative, CCP negative.  She did not tolerate macitentan or ambrisentan .  Echo 7/17 with PA systolic pressure down to 36 mmHg.  Echo in  11/24 with PASP 50 mmHg, mild RV dysfunction.  Echo 8/25 with PASP 49, mild RV dysfunction.  10. Lightheadedness/syncope: Extensive workup.  Event and holter monitors as well as telemetry monitoring negative.  She has not been orthostatic.  - Did not tolerate pyridostigmine  due to hot flashes.  11. Sciatica: Status post back surgery 1/18.  12. COVID-19 infection 12/20.  13. Peripheral neuropathy 14. Aortic stenosis: Mild to moderate on 5/21 echo.  - Mild on 4/23 echo.  - Mild on 11/24 echo.  - Mild-moderate on 8/25 echo 15. Breast cancer: Triple negative on left, s/p mastectomy in 7/21.  16. CVA: 11/22, occurred while off anticoagulation.  - Carotid dopplers (11/22) showed 1-39% BICA stenosis.  17. Traumatic subdural hematoma: 4/24.  18. Autonomic neuropathy with orthostatic hypotension   Current Outpatient Medications  Medication Sig Dispense Refill   amoxicillin (AMOXIL) 500 MG capsule As needed before dental procedures     apixaban  (ELIQUIS ) 2.5 MG TABS tablet Take 1 tablet (2.5 mg total) by mouth 2 (two) times daily. 180 tablet 3   midodrine  (PROAMATINE ) 10 MG tablet Take 1 tablet (10 mg total) by mouth as needed. 30 tablet 3   potassium chloride  (KLOR-CON  M) 10 MEQ tablet Take 4 tablets (40 mEq total) by mouth 2 (two) times daily. 240 tablet 3   Selexipag  (UPTRAVI ) 1000 MCG TABS Take 1,000 mcg by mouth in the morning and at bedtime. 180 tablet 3   sildenafil  (REVATIO ) 20 MG tablet Take 1 tablet (20 mg total) by mouth 3 (three) times daily. 270 tablet 3   torsemide  (DEMADEX ) 20 MG tablet Take 3 tablets (60 mg total) by mouth every morning AND 2 tablets (40 mg total) every evening. 450 tablet 3   No current facility-administered medications for this visit.   Allergies:   Acetaminophen , Hydrocodone-acetaminophen , Letairis  [ambrisentan ], Opsumit [macitentan], Ace inhibitors, Atenolol, Hctz [hydrochlorothiazide], Oxycodone, and Diltiazem hcl   Social History:  The patient  reports that  she has never smoked. She has never used smokeless tobacco. She reports that she does not drink alcohol and does not use drugs.   Family History:  The patient's family history includes Cancer in her mother.   ROS:  Please see the history of present illness.   All other systems are personally reviewed and negative.   Wt Readings from Last 3 Encounters:  10/24/23 57.2 kg (126 lb)  08/23/23 61.2 kg (135 lb)  06/23/23 62.1 kg (137 lb)   Pulse 66   Wt 57.2 kg (126 lb)  SpO2 97%   BMI 19.73 kg/m   Physical Exam:   General:  NAD. No resp difficulty, walked into clinic with rolling walker HEENT: Normal Neck: Supple. No JVD. Cor: Irregular rate & rhythm. No rubs, gallops, 3/6 SEM, clear S2 Lungs: Clear Abdomen: Soft, nontender, nondistended.  Extremities: No cyanosis, clubbing, rash, edema Neuro: Alert & oriented x 3, moves all 4 extremities w/o difficulty. Affect pleasant.  Assessment & Plan: 1. Chronic diastolic CHF:  Echo today showed EF 60-65% with mild RV enlargement and mild RV dysfunction, PASP 49 mmHg, mild-moderate MR, mild-moderate AS with mean gradient 14 and AVA 1.05 cm^2. NYHA class II-III, she is not volume overloaded today. - Continue torsemide  40 mg bid. Recent labs reviewed and are stable, K 3.9, SCR 1.43 - She is now off spironolactone.   - She did not tolerate Jardiance .   2. Pulmonary hypertension: Moderate to severe pulmonary hypertension by echo (PASP 66 mmHg). RHC showed moderate pulmonary arterial hypertension with PVR 5 WU.  PFTs with restrictive changes suggestive of interstitial process but high resolution CT did not show interstitial lung disease.  No evidence for chronic PE on V/Q scan.  Rheumatologic serologies all negative.  Possible group 1 pulmonary hypertension.  She was unable to tolerate macitentan or ambrisentan .  Insurance would not approve Adcirca.  She is now on sildenafil  20 mg tid and selexipag  1000 mcg bid, unable to increase selexipag  further due to  symptoms.  Echo 8/25 showed mild RV dysfunction with PASP estimated 49 mmHg.  - Continue Uptravi  1000 mg bid.  - Continue sildenafil  20 tid.  This was decreased in the past due to orthostasis.  3. Pulmonary: s/p PNA with parapneumonic effusion and eventual VATS with decortication.  As above, restrictive PFTs but no ILD on high resolution CT.  CT in 4/20 showed a concerning RLL nodule and emphysema, though she never smoked. Her pulmonologist ordered a PET scan after this per her report, and PET was normal.  4. Atrial fibrillation: Chronic. Off Toprol  XL due to hypotension. HR is controlled.  Xarelto  stopped during 4/24 admit with subdural hematoma.  Would not be a good candidate for Watchman Device.  She was eventually restarted on apixaban  2.5 mg bid (lower dose given age > 80 and weight < 60 kg).  5. Hypertension + orthostatic hypotension: Long history of marked BP fluctuations.  Occasional episodes of lightheadedness,, takes midodrine  when SBP < 90.  - Will need to avoid antihypertensives, had SDH with fall in the past. No recent falls.   - Continue compression stockings.  - She did not tolerate pyridostigmine  in the past due to hot flashes.   - Continue midodrine  10 mg prn.  This seems to be working reasonably well, she takes it if her SBP is < 90.   6. CKD: Stage 3b. Last creatinine 1.4  7. Aortic stenosis: Mild-moderate on 8/25 echo. No chest pain or syncope. - Will follow. 8. Hx of CVA: She had a stroke in 11/22 while off Xarelto .  Carotid dopplers were unremarkable.  Her stroke was atrial fibrillation-related.  - Continue Eliquis  2.5 mg bid.  9. Vision disturbance: Had recent episode of what sounds like transient amaurosis fugax, ? If related to BP fluctuations vs TIA. - She follows closely with Neurology and Ophthalmology.  Follow up in 6 months with Dr. Rolan. Doing well!  Harlene HERO [facility] FNP-BC 10/24/2023  [facility] [address] Heart and  Vascular [place] [address] [phone] (office) (  [phone] (fax)

## 2023-10-24 NOTE — Patient Instructions (Addendum)
 Good to see you today!  No medication changes were made   Your physician recommends that you schedule a follow-up appointment 6 months(April) Call office in February to schedule an appointment  If you have any questions or concerns before your next appointment please send us  a message through [place] or call our office at [phone].    TO LEAVE A MESSAGE FOR THE NURSE SELECT OPTION 2, PLEASE LEAVE A MESSAGE INCLUDING: YOUR NAME DATE OF BIRTH CALL BACK NUMBER REASON FOR CALL**this is important as we prioritize the call backs  YOU WILL RECEIVE A CALL BACK THE SAME DAY AS LONG AS YOU CALL BEFORE 4:00 PM At the [facility], you and your health needs are our priority. As part of our continuing mission to provide you with exceptional heart care, we have created designated Provider Care Teams. These Care Teams include your primary Cardiologist (physician) and Advanced Practice Providers (APPs- Physician Assistants and Nurse Practitioners) who all work together to provide you with the care you need, when you need it.   You may see any of the following providers on your designated Care Team at your next follow up: Dr Toribio Fuel Dr Ezra Shuck Dr. Ria Commander Dr. Morene Brownie Amy Lenetta, NP Caffie Shed, [place] Trinity [place], [place] Beckey Coe, NP [place] Lee, NP Ellouise Class, NP Tinnie Redman, PharmD Jaun Bash, PharmD   Please be sure to bring in all your medications bottles to every appointment.    Thank you for choosing [facility]

## 2023-11-15 ENCOUNTER — Other Ambulatory Visit (HOSPITAL_COMMUNITY): Payer: Self-pay | Admitting: Cardiology
